# Patient Record
Sex: Male | Born: 1950
Health system: Southern US, Community
[De-identification: ages and names within clinical notes are randomized; demographics above are authoritative.]

## PROBLEM LIST (undated history)

## (undated) DIAGNOSIS — T7840XA Allergy, unspecified, initial encounter: Secondary | ICD-10-CM

## (undated) DIAGNOSIS — I10 Essential (primary) hypertension: Secondary | ICD-10-CM

## (undated) DIAGNOSIS — I2510 Atherosclerotic heart disease of native coronary artery without angina pectoris: Secondary | ICD-10-CM

## (undated) DIAGNOSIS — E785 Hyperlipidemia, unspecified: Secondary | ICD-10-CM

## (undated) DIAGNOSIS — N529 Male erectile dysfunction, unspecified: Secondary | ICD-10-CM

## (undated) DIAGNOSIS — H269 Unspecified cataract: Secondary | ICD-10-CM

## (undated) HISTORY — DX: Male erectile dysfunction, unspecified: N52.9

## (undated) HISTORY — DX: Hyperlipidemia, unspecified: E78.5

## (undated) HISTORY — DX: Unspecified cataract: H26.9

## (undated) HISTORY — PX: CORONARY STENT PLACEMENT: SHX1402

## (undated) HISTORY — PX: CATARACT EXTRACTION: SUR2

## (undated) HISTORY — PX: EYE SURGERY: SHX253

## (undated) HISTORY — DX: Atherosclerotic heart disease of native coronary artery without angina pectoris: I25.10

## (undated) HISTORY — DX: Allergy, unspecified, initial encounter: T78.40XA

## (undated) HISTORY — PX: VASECTOMY: SHX75

## (undated) HISTORY — PX: POLYPECTOMY: SHX149

## (undated) HISTORY — PX: COLONOSCOPY: SHX174

---

## 1999-06-21 ENCOUNTER — Inpatient Hospital Stay (HOSPITAL_COMMUNITY): Admission: EM | Admit: 1999-06-21 | Discharge: 1999-06-22 | Payer: Self-pay | Admitting: Emergency Medicine

## 2011-11-04 ENCOUNTER — Other Ambulatory Visit (HOSPITAL_COMMUNITY): Payer: Self-pay | Admitting: Oral Surgery

## 2011-11-23 ENCOUNTER — Encounter (HOSPITAL_COMMUNITY): Payer: Self-pay | Admitting: Pharmacy Technician

## 2011-11-29 ENCOUNTER — Encounter (HOSPITAL_COMMUNITY): Payer: Self-pay | Admitting: *Deleted

## 2011-11-29 NOTE — Pre-Procedure Instructions (Signed)
20 DEONTAY LADNIER  11/29/2011   Your procedure is scheduled on:  Monday, June 24th  Report to [facility] at  AM.  Call this number if you have problems the morning of surgery: [phone]   Remember:  Do not dink any liquid or eat nay food after midnight .   Take these medicines the morning of surgery with A SIP OF WATER: None   Do not wear jewelry, make-up or nail polish.  Do not wear lotions, powders, or perfumes. You may wear deodorant.  Do not shave 48 hours prior to surgery. Men may shave face and neck.  Do not bring valuables to the hospital.  Contacts, dentures or bridgework may not be worn into surgery.  Leave suitcase in the car. After surgery it may be brought to your room.  For patients admitted to the hospital, checkout time is 11:00 AM the day of discharge.   Patients discharged the day of surgery will not be allowed to drive home.  Name and phone number of your driver: _______________  Special Instructions: CHG Shower Use Special Wash: 1/2 bottle night before surgery and 1/2 bottle morning of surgery.   Please read over the following fact sheets that you were given: Pain Booklet, Coughing and Deep Breathing, MRSA Information and Surgical Site Infection Prevention

## 2011-12-01 ENCOUNTER — Encounter (HOSPITAL_COMMUNITY)
Admission: RE | Admit: 2011-12-01 | Discharge: 2011-12-01 | Disposition: A | Discharge status: Home / Self Care | Payer: 59 | Source: Ambulatory Visit | Attending: Anesthesiology | Admitting: Anesthesiology

## 2011-12-01 ENCOUNTER — Encounter (HOSPITAL_COMMUNITY)
Admission: RE | Admit: 2011-12-01 | Discharge: 2011-12-01 | Disposition: A | Discharge status: Home / Self Care | Payer: 59 | Source: Ambulatory Visit | Attending: Oral Surgery | Admitting: Oral Surgery

## 2011-12-01 LAB — CBC
HCT: 40.1 % (ref 39.0–52.0)
Hemoglobin: 13.7 g/dL (ref 13.0–17.0)
MCH: 31.6 pg (ref 26.0–34.0)
MCHC: 34.2 g/dL (ref 30.0–36.0)
RBC: 4.34 MIL/uL (ref 4.22–5.81)

## 2011-12-01 LAB — BASIC METABOLIC PANEL
BUN: 16 mg/dL (ref 6–23)
CO2: 28 mEq/L (ref 19–32)
Chloride: 102 mEq/L (ref 96–112)
Glucose, Bld: 104 mg/dL — ABNORMAL HIGH (ref 70–99)
Potassium: 3.7 mEq/L (ref 3.5–5.1)
Sodium: 138 mEq/L (ref 135–145)

## 2011-12-01 NOTE — Pre-Procedure Instructions (Signed)
20 Johnny Wilcox  12/01/2011   Your procedure is scheduled on:  Monday, June 24th  Report to [facility] at  8:45 AM.  Call this number if you have problems the morning of surgery: [phone]   Remember:  Do not dink any liquid or eat nay food after midnight .   Take these medicines the morning of surgery with A SIP OF WATER: None   Do not wear jewelry, make-up or nail polish.  Do not wear lotions, powders, or perfumes. You may wear deodorant.  Do not shave 48 hours prior to surgery. Men may shave face and neck.  Do not bring valuables to the hospital.  Contacts, dentures or bridgework may not be worn into surgery.  Leave suitcase in the car. After surgery it may be brought to your room.  For patients admitted to the hospital, checkout time is 11:00 AM the day of discharge.   Patients discharged the day of surgery will not be allowed to drive home.  Name and phone number of your driver: Chyrl Civatte [phone]   Special Instructions: CHG Shower Use Special Wash: 1/2 bottle night before surgery and 1/2 bottle morning of surgery.   Please read over the following fact sheets that you were given: Pain Booklet, Coughing and Deep Breathing, MRSA Information and Surgical Site Infection Prevention

## 2011-12-02 NOTE — H&P (Signed)
HISTORY AND PHYSICAL  Johnny Wilcox is a 61 y.o. male patient with CC: Painful teeth  No diagnosis found.  Past Medical History  Diagnosis Date  . Hypertension     No current facility-administered medications for this encounter.   Current Outpatient Prescriptions  Medication Sig Dispense Refill  . lisinopril (PRINIVIL,ZESTRIL) 20 MG tablet Take 20 mg by mouth daily.       No Known Allergies Active Problems:  * No active hospital problems. *   Vitals: There were no vitals taken for this visit. Lab results:No results found for this or any previous visit (from the past 24 hour(s)). Radiology Results: Dg Chest 2 View  12/01/2011  *RADIOLOGY REPORT*  Clinical Data: Preop for teeth extraction, hypertension  CHEST - 2 VIEW  Comparison: None.  Findings: No active infiltrate or effusion is seen.  Mediastinal contours appear normal.  Minimal peribronchial thickening is noted. The heart is within normal limits in size.  No bony abnormality is seen.  IMPRESSION: No active lung disease.  Original Report Authenticated By: Juline Patch, M.D.   General appearance: alert, cooperative and mildly obese Head: Normocephalic, without obvious abnormality, atraumatic Eyes: negative Nose: Nares normal. Septum midline. Mucosa normal. No drainage or sinus tenderness. Throat: severe dental caries teeth #'s 2, 3, 4, 5, 7, 10, 11, 12, 13, 14, 15, 20, 21, 22, 23, 26, 27, 28, 29,  Neck: no adenopathy and supple, symmetrical, trachea midline Resp: clear to auscultation bilaterally Cardio: regular rate and rhythm, S1, S2 normal, no murmur, click, rub or gallop GI: soft, non-tender; bowel sounds normal; no masses,  no organomegaly  Assessment:[name] HTN with teeth #'s 2, 3, 4, 5, 7, 10, 11, 12, 13, 14, 15, 20, 21, 22, 23, 26, 27, 28, 29,  Plan:Extract teeth #'s 2, 3, 4, 5, 7, 10, 11, 12, 13, 14, 15, 20, 21, 22, 23, 26, 27, 28, 29, alveoloplasty, insert immediate dentures. General anesthesia. Day  surgery   Johnny Wilcox M 12/02/2011

## 2011-12-06 ENCOUNTER — Encounter (HOSPITAL_COMMUNITY): Payer: Self-pay | Admitting: Surgery

## 2011-12-06 ENCOUNTER — Encounter (HOSPITAL_COMMUNITY)
Admission: RE | Disposition: A | Discharge status: Home / Self Care | Payer: Self-pay | Source: Ambulatory Visit | Attending: Oral Surgery

## 2011-12-06 ENCOUNTER — Encounter (HOSPITAL_COMMUNITY): Payer: Self-pay | Admitting: Certified Registered"

## 2011-12-06 ENCOUNTER — Ambulatory Visit (HOSPITAL_COMMUNITY): Payer: 59 | Admitting: Certified Registered"

## 2011-12-06 ENCOUNTER — Ambulatory Visit (HOSPITAL_COMMUNITY)
Admission: RE | Admit: 2011-12-06 | Discharge: 2011-12-06 | Disposition: A | Discharge status: Home / Self Care | Payer: 59 | Source: Ambulatory Visit | Attending: Oral Surgery | Admitting: Oral Surgery

## 2011-12-06 ENCOUNTER — Encounter (HOSPITAL_COMMUNITY): Payer: Self-pay | Admitting: Certified Registered Nurse Anesthetist

## 2011-12-06 DIAGNOSIS — Z01812 Encounter for preprocedural laboratory examination: Secondary | ICD-10-CM | POA: Insufficient documentation

## 2011-12-06 DIAGNOSIS — I10 Essential (primary) hypertension: Secondary | ICD-10-CM | POA: Insufficient documentation

## 2011-12-06 DIAGNOSIS — Z01818 Encounter for other preprocedural examination: Secondary | ICD-10-CM | POA: Insufficient documentation

## 2011-12-06 DIAGNOSIS — Z01810 Encounter for preprocedural cardiovascular examination: Secondary | ICD-10-CM | POA: Insufficient documentation

## 2011-12-06 DIAGNOSIS — K029 Dental caries, unspecified: Secondary | ICD-10-CM

## 2011-12-06 DIAGNOSIS — F17200 Nicotine dependence, unspecified, uncomplicated: Secondary | ICD-10-CM | POA: Insufficient documentation

## 2011-12-06 HISTORY — DX: Essential (primary) hypertension: I10

## 2011-12-06 HISTORY — PX: MULTIPLE EXTRACTIONS WITH ALVEOLOPLASTY: SHX5342

## 2011-12-06 SURGERY — MULTIPLE EXTRACTION WITH ALVEOLOPLASTY
Anesthesia: General | Site: Mouth | Laterality: Bilateral | Wound class: Clean Contaminated

## 2011-12-06 MED ORDER — SUCCINYLCHOLINE CHLORIDE 20 MG/ML IJ SOLN
INTRAMUSCULAR | Status: DC | PRN
  Administered 2011-12-06: 120 mg via INTRAVENOUS

## 2011-12-06 MED ORDER — GLYCOPYRROLATE 0.2 MG/ML IJ SOLN
INTRAMUSCULAR | Status: DC | PRN
  Administered 2011-12-06: 0.2 mg via INTRAVENOUS

## 2011-12-06 MED ORDER — CEFAZOLIN SODIUM 1-5 GM-% IV SOLN
INTRAVENOUS | Status: AC
  Filled 2011-12-06: qty 100

## 2011-12-06 MED ORDER — LACTATED RINGERS IV SOLN
INTRAVENOUS | Status: DC | PRN
  Administered 2011-12-06 (×2): via INTRAVENOUS

## 2011-12-06 MED ORDER — LACTATED RINGERS IV SOLN
INTRAVENOUS | Status: DC
  Administered 2011-12-06: 11:00:00 via INTRAVENOUS

## 2011-12-06 MED ORDER — LIDOCAINE HCL (CARDIAC) 20 MG/ML IV SOLN
INTRAVENOUS | Status: DC | PRN
  Administered 2011-12-06: 50 mg via INTRAVENOUS

## 2011-12-06 MED ORDER — MIDAZOLAM HCL 5 MG/5ML IJ SOLN
INTRAMUSCULAR | Status: DC | PRN
  Administered 2011-12-06: 1 mg via INTRAVENOUS

## 2011-12-06 MED ORDER — FENTANYL CITRATE 0.05 MG/ML IJ SOLN
INTRAMUSCULAR | Status: DC | PRN
  Administered 2011-12-06 (×2): 100 ug via INTRAVENOUS

## 2011-12-06 MED ORDER — SODIUM CHLORIDE 0.9 % IR SOLN
Status: DC | PRN
  Administered 2011-12-06: 1000 mL

## 2011-12-06 MED ORDER — LIDOCAINE-EPINEPHRINE 2 %-1:100000 IJ SOLN
INTRAMUSCULAR | Status: DC | PRN
  Administered 2011-12-06: 20 mL

## 2011-12-06 MED ORDER — CEFAZOLIN SODIUM 1-5 GM-% IV SOLN
INTRAVENOUS | Status: DC | PRN
  Administered 2011-12-06: 2 g via INTRAVENOUS

## 2011-12-06 MED ORDER — ONDANSETRON HCL 4 MG/2ML IJ SOLN
INTRAMUSCULAR | Status: DC | PRN
  Administered 2011-12-06: 4 mg via INTRAVENOUS

## 2011-12-06 MED ORDER — HYDROMORPHONE HCL PF 1 MG/ML IJ SOLN
0.2500 mg | INTRAMUSCULAR | Status: DC | PRN
  Administered 2011-12-06: 0.5 mg via INTRAVENOUS

## 2011-12-06 MED ORDER — PROPOFOL 10 MG/ML IV EMUL
INTRAVENOUS | Status: DC | PRN
  Administered 2011-12-06: 50 mg via INTRAVENOUS
  Administered 2011-12-06: 150 mg via INTRAVENOUS

## 2011-12-06 MED ORDER — LIDOCAINE-EPINEPHRINE 2 %-1:100000 IJ SOLN
INTRAMUSCULAR | Status: AC
  Filled 2011-12-06: qty 1

## 2011-12-06 MED ORDER — EPHEDRINE SULFATE 50 MG/ML IJ SOLN
INTRAMUSCULAR | Status: DC | PRN
  Administered 2011-12-06: 10 mg via INTRAVENOUS
  Administered 2011-12-06: 5 mg via INTRAVENOUS

## 2011-12-06 MED ORDER — OXYMETAZOLINE HCL 0.05 % NA SOLN
NASAL | Status: DC | PRN
  Administered 2011-12-06: 1 via NASAL

## 2011-12-06 MED ORDER — ONDANSETRON HCL 4 MG/2ML IJ SOLN: 4.0000 mg | Freq: Once | INTRAMUSCULAR | Status: DC | PRN

## 2011-12-06 MED ORDER — HYDROMORPHONE HCL PF 1 MG/ML IJ SOLN
INTRAMUSCULAR | Status: AC
  Filled 2011-12-06: qty 1

## 2011-12-06 MED ORDER — OXYCODONE-ACETAMINOPHEN 5-325 MG PO TABS: 1.0000 | ORAL_TABLET | ORAL | Status: AC | PRN

## 2011-12-06 SURGICAL SUPPLY — 28 items
BUR CROSS CUT FISSURE 1.6 (BURR) ×2 IMPLANT
BUR EGG ELITE 4.0 (BURR) ×2 IMPLANT
CANISTER SUCTION 2500CC (MISCELLANEOUS) ×2 IMPLANT
CLOTH BEACON ORANGE TIMEOUT ST (SAFETY) ×2 IMPLANT
COVER SURGICAL LIGHT HANDLE (MISCELLANEOUS) ×2 IMPLANT
CRADLE DONUT ADULT HEAD (MISCELLANEOUS) ×2 IMPLANT
DECANTER SPIKE VIAL GLASS SM (MISCELLANEOUS) ×2 IMPLANT
GAUZE PACKING FOLDED 2  STR (GAUZE/BANDAGES/DRESSINGS) ×1
GAUZE PACKING FOLDED 2 STR (GAUZE/BANDAGES/DRESSINGS) ×1 IMPLANT
GLOVE BIO SURGEON STRL SZ 6.5 (GLOVE) ×2 IMPLANT
GLOVE BIO SURGEON STRL SZ7 (GLOVE) ×1 IMPLANT
GLOVE BIO SURGEON STRL SZ7.5 (GLOVE) ×2 IMPLANT
GLOVE BIOGEL PI IND STRL 7.0 (GLOVE) ×1 IMPLANT
GLOVE BIOGEL PI INDICATOR 7.0 (GLOVE)
GOWN STRL NON-REIN LRG LVL3 (GOWN DISPOSABLE) ×3 IMPLANT
GOWN STRL REIN XL XLG (GOWN DISPOSABLE) ×2 IMPLANT
KIT BASIN OR (CUSTOM PROCEDURE TRAY) ×2 IMPLANT
KIT ROOM TURNOVER OR (KITS) ×2 IMPLANT
NEEDLE 22X1 1/2 (OR ONLY) (NEEDLE) ×2 IMPLANT
NS IRRIG 1000ML POUR BTL (IV SOLUTION) ×2 IMPLANT
PAD ARMBOARD 7.5X6 YLW CONV (MISCELLANEOUS) ×4 IMPLANT
SUT CHROMIC 3 0 PS 2 (SUTURE) ×3 IMPLANT
SYR 50ML SLIP (SYRINGE) ×1 IMPLANT
TOWEL OR 17X26 10 PK STRL BLUE (TOWEL DISPOSABLE) ×2 IMPLANT
TRAY ENT MC OR (CUSTOM PROCEDURE TRAY) ×2 IMPLANT
TUBING IRRIGATION (MISCELLANEOUS) ×1 IMPLANT
WATER STERILE IRR 1000ML POUR (IV SOLUTION) IMPLANT
YANKAUER SUCT BULB TIP NO VENT (SUCTIONS) ×2 IMPLANT

## 2011-12-06 NOTE — H&P (Signed)
H&P documentation  -History and Physical Reviewed  -Patient has been re-examined  -No change in the plan of care  Johnny Wilcox  

## 2011-12-06 NOTE — Anesthesia Postprocedure Evaluation (Signed)
  Anesthesia Post-op Note  Patient: Johnny Wilcox  Procedure(s) Performed: Procedure(s) (LRB): MULTIPLE EXTRACION WITH ALVEOLOPLASTY (Bilateral)  Patient Location: PACU  Anesthesia Type: General  Level of Consciousness: awake, alert  and oriented  Airway and Oxygen Therapy: Patient Spontanous Breathing  Post-op Pain: none  Post-op Assessment: Post-op Vital signs reviewed and Patient's Cardiovascular Status Stable  Post-op Vital Signs: stable  Complications: No apparent anesthesia complications

## 2011-12-06 NOTE — Transfer of Care (Signed)
Immediate Anesthesia Transfer of Care Note  Patient: Johnny Wilcox  Procedure(s) Performed: Procedure(s) (LRB): MULTIPLE EXTRACION WITH ALVEOLOPLASTY (Bilateral)  Patient Location: PACU  Anesthesia Type: General  Level of Consciousness: awake, alert , oriented and patient cooperative  Airway & Oxygen Therapy: Patient Spontanous Breathing and Patient connected to face mask oxygen  Post-op Assessment: Report given to PACU RN, Post -op Vital signs reviewed and stable and Patient moving all extremities X 4  Post vital signs: Reviewed and stable  Complications: No apparent anesthesia complications

## 2011-12-06 NOTE — Anesthesia Preprocedure Evaluation (Signed)
Anesthesia Evaluation  Patient identified by MRN, date of birth, ID band  Reviewed: Allergy & Precautions, H&P , NPO status , Patient's Chart, lab work & pertinent test results  Airway Mallampati: II      Dental  (+) Poor Dentition   Pulmonary  breath sounds clear to auscultation        Cardiovascular Rhythm:Regular Rate:Normal     Neuro/Psych    GI/Hepatic   Endo/Other    Renal/GU      Musculoskeletal   Abdominal   Peds  Hematology   Anesthesia Other Findings   Reproductive/Obstetrics                           Anesthesia Physical Anesthesia Plan  ASA: II  Anesthesia Plan: General   Post-op Pain Management:    Induction: Intravenous  Airway Management Planned: Nasal ETT  Additional Equipment:   Intra-op Plan:   Post-operative Plan: Extubation in OR  Informed Consent: I have reviewed the patients History and Physical, chart, labs and discussed the procedure including the risks, benefits and alternatives for the proposed anesthesia with the patient or authorized representative who has indicated his/her understanding and acceptance.   Dental advisory given  Plan Discussed with:   Anesthesia Plan Comments: (Htn  Poor Dentition   Plan GA with nasal ETT  Kipp Brood, MD)        Anesthesia Quick Evaluation

## 2011-12-06 NOTE — Op Note (Signed)
Johnny Wilcox, Johnny Wilcox NO.:  [id number]  MEDICAL RECORD NO.:  [id number]  LOCATION:  [facility]                         FACILITY:  [facility]  PHYSICIAN:  Georgia Lopes, M.D.  DATE OF BIRTH:  02/23/1951  DATE OF PROCEDURE:  12/06/2011 DATE OF DISCHARGE:  12/06/2011                              OPERATIVE REPORT   PREOPERATIVE DIAGNOSIS:  Nonrestorable teeth numbers 2, 3, 4, 5, 7, 10, 11, 12, 13, 14, 15, 16, 19, 20, 21, 22, 23, 26, 27, 28, 29.  POSTOPERATIVE DIAGNOSIS:  Nonrestorable teeth numbers 2, 3, 4, 5, 7, 10, 11, 12, 13, 14, 15, 16, 19, 20, 21, 22, 23, 26, 27, 28, 29.  PROCEDURE:  Extraction of teeth numbers 2, 3, 4, 5, 7, 10, 11, 12, 13, 14, 15, 16, 19, 20, 21, 22, 23, 26, 27, 28, 29.  Alveoplasty of right and left maxilla and mandible.  Insertion of immediate upper dentures.  SURGEON:  Georgia Lopes, M.D.  ANESTHESIA:  General, Dr. Noreene Larsson attending, nasal intubation.  INDICATIONS FOR PROCEDURE:  Johnny Wilcox is a 61 year old white male with severe dental caries on his remaining teeth.  He has a history of hypertension, oral tobacco use.  At his initial consultation and when the Surgery was brought to the OR, he did have a lesion in his right buccal vestibule, but since then he had stopped using oral tobacco and the lesion had resolved upon re-exam shortly prior to surgery.  For this reason, at the time of surgery, it was understood with the patient and myself that he would not need a biopsy in the right buccal vestibule of the mandible.  Because of the extensiveness of the surgery and the number of teeth to be removed, it was recommended that the patient undergo the procedure with general anesthesia and intubation for airway protection.  PROCEDURE:  The patient was taken to the operating room, placed on the table in supine position.  General anesthesia was administered intravenously and a nasal endotracheal tube was placed and secured.  The eyes were protected.   The patient was draped for the procedure.  A time- out was performed.  The posterior pharynx was suctioned and a throat pack was placed.  A 2% lidocaine with 1:100,000 epinephrine was infiltrated in the inferior-alveolar block on the right and left sides, and a buccal and palatal infiltration in the maxilla around the teeth to be removed.  A total of 18 mL was utilized.  Then, a 15 blade was used to make a full-thickness incision in the right mandible beginning at tooth number 19 carried anteriorly to tooth number 23 on both the buccal and lingual surfaces.  In the maxilla, the 15 blade was used to make a full-thickness incision around teeth number 10, 11, 12, 13, 14, 15 on the buccal and palatal aspects.  Then the periosteal elevator was used to reflect the periosteum from around these teeth.  Attempt was made to elevate the teeth with a 301 elevator and attempts with the universal forceps was made to extract the teeth; however, the teeth proved to be decayed to the point that they have fractured off and were not removed from the  mouth.  Then, the Stryker handpiece with a fissure bur under irrigation was used to remove circumferential bone from around these teeth and dissection of the posterior teeth were applicable.  The teeth were then removed using the 301 elevator and a rongeur.  Sockets were curetted, but not sutured at this point, and then the bite block was repositioned to the other side of the mouth and a similar procedure was performed in the right maxilla and mandible.  Using a 15 blade, a full- thickness incision was created around teeth numbers 2, 3, 4, 5, and 7 in the maxilla and teeth numbers 26, 27, 28, 29 in the mandible.  The periosteum was reflected and the interproximal bone was removed with the Stryker handpiece using irrigation.  The teeth were then elevated and removed with a 301 elevator and rongeur, and sockets were curetted and then the periosteum was further  reflected to allow for alveoplasty in the maxilla and mandible.  The egg-shaped bur and the bone file were then used to perform the alveoplasty.  The dentures were tried and found to fit adequately.  Then, the areas were irrigated and sutures were placed using 3-0 chromic.  The oral cavity was then irrigated and suctioned.  The throat pack was removed.  Dentures were then placed in the mouth, and the patient was awakened, extubated and taken to the recovery room breathing spontaneously in good condition.  ESTIMATED BLOOD LOSS:  Minimum.  COMPLICATIONS:  None.  SPECIMENS:  None.     Georgia Lopes, M.D.     SMJ/MEDQ  D:  12/06/2011  T:  12/06/2011  Job:  [id number]

## 2011-12-06 NOTE — Op Note (Signed)
12/06/2011  1:59 PM  PATIENT:  Johnny Wilcox  61 y.o. male  PRE-OPERATIVE DIAGNOSIS:  Non restorable teeth #'s 2, 3, 4, 5, 7, 10, 11, 12, 13, 14, 15, 16, 19, 20, 21, 22, 23, 26, 27, 28, 29  POST-OPERATIVE DIAGNOSIS:  SAME  PROCEDURE:  Procedure(s): MULTIPLE EXTRACTION Teeth #'s 2, 3, 4, 5, 7, 10, 11, 12, 13, 14, 15, 16, 19, 20, 21, 22, 23, 26, 27, 28, 29  WITH ALVEOLOPLASTY, Insertion immediate upper and lower dentures.  SURGEON:  Surgeon(s): Georgia Lopes, DDS  ANESTHESIA:   local and general  EBL:  minimal  DRAINS: none   SPECIMEN:  No Specimen  COUNTS:  YES  PLAN OF CARE: Discharge to home after PACU  PATIENT DISPOSITION:  PACU - hemodynamically stable.   PROCEDURE DETAILS: Dictation # [id number]  Georgia Lopes, DMD 12/06/2011 1:59 PM

## 2011-12-09 ENCOUNTER — Encounter (HOSPITAL_COMMUNITY): Payer: Self-pay | Admitting: Oral Surgery

## 2012-02-04 ENCOUNTER — Encounter: Payer: Self-pay | Admitting: Internal Medicine

## 2012-03-22 ENCOUNTER — Ambulatory Visit (INDEPENDENT_AMBULATORY_CARE_PROVIDER_SITE_OTHER): Payer: 59 | Admitting: Internal Medicine

## 2012-03-22 ENCOUNTER — Encounter: Payer: Self-pay | Admitting: Internal Medicine

## 2012-03-22 VITALS — BP 115/73 | HR 44 | Temp 98.0°F | Resp 16 | Ht 69.0 in | Wt 234.0 lb

## 2012-03-22 DIAGNOSIS — I10 Essential (primary) hypertension: Secondary | ICD-10-CM

## 2012-03-22 DIAGNOSIS — Z6834 Body mass index (BMI) 34.0-34.9, adult: Secondary | ICD-10-CM | POA: Insufficient documentation

## 2012-03-22 DIAGNOSIS — E785 Hyperlipidemia, unspecified: Secondary | ICD-10-CM

## 2012-03-22 DIAGNOSIS — Z23 Encounter for immunization: Secondary | ICD-10-CM

## 2012-03-22 DIAGNOSIS — Z0000 Encounter for general adult medical examination without abnormal findings: Secondary | ICD-10-CM

## 2012-03-22 DIAGNOSIS — N400 Enlarged prostate without lower urinary tract symptoms: Secondary | ICD-10-CM | POA: Insufficient documentation

## 2012-03-22 DIAGNOSIS — K5790 Diverticulosis of intestine, part unspecified, without perforation or abscess without bleeding: Secondary | ICD-10-CM | POA: Insufficient documentation

## 2012-03-22 LAB — IFOBT (OCCULT BLOOD): IFOBT: NEGATIVE

## 2012-03-22 MED ORDER — LISINOPRIL-HYDROCHLOROTHIAZIDE 10-12.5 MG PO TABS: 1.0000 | ORAL_TABLET | Freq: Every day | ORAL | Status: DC

## 2012-03-22 NOTE — Progress Notes (Signed)
Subjective:    Patient ID: Johnny Wilcox, male    DOB: [birth year], 61 y.o.   MRN: [id number]  HPIHere for annual physical examination and followup of hypertension Continues to work 12-15 hours a day at [employer] Denies any new health issues Continues on lisinopril hydrochlorothiazide 10/12.5 for hypertension but misses 2-3 doses a week. Outside blood pressures remained normal  Past history includes cataracts/now has glasses  Family history has no specific medical problems other than hypertension  Social history married long-term/one daughter and one son/one granddaughter who is a couple of his High school education Nonsmoker/no alcohol/never exercises because he works so hard   Review of Systems 13 point review of systems is negative including no chest pain palpitations shortness of breath easy fatigability,Edema, urinary complaints or change in bowel movements. He has used cialis as call in w/ good results    Objective:   Physical Exam Vital signs blood pressure 115/73 weight 234 pounds pulse 44 Skin is clear HEENT reveals pupils equal round reactive to light and accommodation with intact extraocular movements/Conjunctiva clear//Wearing glasses TMs clear/nares clear/oral pharynx clear No thyromegaly or lymphadenopathy Heart regular without murmur No carotid bruits Lungs clear to auscultation Abdomen soft nontender nondistended without organomegaly or masses/no abdominal bruit Rectal exam without masses/prostate soft symmetrical with no nodules Brown stool for Hemoccult Extremities= full range of motion about large joints No peripheral edema/full peripheral pulses Neurological grossly intact including hearing        Assessment & Plan:  Annual exam suggests that he is in very good condition despite hypertension and a BMI of 34 Problem #1 Hypertension Problem #2 BMI 34 Problem #3 ED  Routine labs sent Meds ordered this encounter  Medications  .  lisinopril-hydrochlorothiazide (PRINZIDE,ZESTORETIC) 10-12.5 MG per tablet    Sig: Take 1 tablet by mouth daily.    Dispense:  90 tablet    Refill:  3  Pneumovax Flu vaccine Check to see if colonoscopy due OK to continue cialis 20 and call for refills   Adden: labs Results for orders placed in visit on 03/22/12  CBC WITH DIFFERENTIAL      Component Value Range   WBC 6.9  4.0 - 10.5 K/uL   RBC 4.59  4.22 - 5.81 MIL/uL   Hemoglobin 14.3  13.0 - 17.0 g/dL   HCT 44.0  10.2 - 72.5 %   MCV 91.5  78.0 - 100.0 fL   MCH 31.2  26.0 - 34.0 pg   MCHC 34.0  30.0 - 36.0 g/dL   RDW 36.6  44.0 - 34.7 %   Platelets 262  150 - 400 K/uL   Neutrophils Relative 63  43 - 77 %   Neutro Abs 4.4  1.7 - 7.7 K/uL   Lymphocytes Relative 26  12 - 46 %   Lymphs Abs 1.8  0.7 - 4.0 K/uL   Monocytes Relative 7  3 - 12 %   Monocytes Absolute 0.5  0.1 - 1.0 K/uL   Eosinophils Relative 3  0 - 5 %   Eosinophils Absolute 0.2  0.0 - 0.7 K/uL   Basophils Relative 0  0 - 1 %   Basophils Absolute 0.0  0.0 - 0.1 K/uL   Smear Review Criteria for review not met    COMPREHENSIVE METABOLIC PANEL      Component Value Range   Sodium 139  135 - 145 mEq/L   Potassium 3.8  3.5 - 5.3 mEq/L   Chloride 102  96 - 112 mEq/L  CO2 30  19 - 32 mEq/L   Glucose, Bld 74  70 - 99 mg/dL   BUN 15  6 - 23 mg/dL   Creat 0.98  1.19 - 1.47 mg/dL   Total Bilirubin 0.9  0.3 - 1.2 mg/dL   Alkaline Phosphatase 65  39 - 117 U/L   AST 17  0 - 37 U/L   ALT 15  0 - 53 U/L   Total Protein 7.7  6.0 - 8.3 g/dL   Albumin 4.8  3.5 - 5.2 g/dL   Calcium 9.8  8.4 - 82.9 mg/dL  LIPID PANEL      Component Value Range   Cholesterol 203 (*) 0 - 200 mg/dL   Triglycerides 562 (*) <150 mg/dL   HDL 34 (*) >13 mg/dL   Total CHOL/HDL Ratio 6.0     VLDL 33  0 - 40 mg/dL   LDL Cholesterol 086 (*) 0 - 99 mg/dL  PSA      Component Value Range   PSA 0.48  <=4.00 ng/mL  IFOBT (OCCULT BLOOD)      Component Value Range   IFOBT Negative     Will need to  add lipitor 10mg  with LDL goal <100

## 2012-03-23 LAB — LIPID PANEL
LDL Cholesterol: 136 mg/dL — ABNORMAL HIGH (ref 0–99)
Total CHOL/HDL Ratio: 6 Ratio
VLDL: 33 mg/dL (ref 0–40)

## 2012-03-23 LAB — CBC WITH DIFFERENTIAL/PLATELET
Basophils Absolute: 0 10*3/uL (ref 0.0–0.1)
Eosinophils Absolute: 0.2 10*3/uL (ref 0.0–0.7)
Eosinophils Relative: 3 % (ref 0–5)
Lymphocytes Relative: 26 % (ref 12–46)
MCV: 91.5 fL (ref 78.0–100.0)
Platelets: 262 10*3/uL (ref 150–400)
RDW: 12.9 % (ref 11.5–15.5)
WBC: 6.9 10*3/uL (ref 4.0–10.5)

## 2012-03-23 LAB — PSA: PSA: 0.48 ng/mL (ref <=4.00)

## 2012-03-23 LAB — COMPREHENSIVE METABOLIC PANEL
ALT: 15 U/L (ref 0–53)
AST: 17 U/L (ref 0–37)
Chloride: 102 mEq/L (ref 96–112)
Creat: 1.05 mg/dL (ref 0.50–1.35)
Total Bilirubin: 0.9 mg/dL (ref 0.3–1.2)

## 2012-03-24 ENCOUNTER — Encounter: Payer: Self-pay | Admitting: Internal Medicine

## 2012-03-24 MED ORDER — ATORVASTATIN CALCIUM 10 MG PO TABS: 10.0000 mg | ORAL_TABLET | Freq: Every day | ORAL | Status: DC

## 2012-03-24 NOTE — Addendum Note (Signed)
Addended by: Tonye Pearson on: 03/24/2012 10:54 AM   Modules accepted: Orders

## 2012-03-28 ENCOUNTER — Encounter: Payer: Self-pay | Admitting: Gastroenterology

## 2012-04-04 ENCOUNTER — Other Ambulatory Visit: Payer: Self-pay | Admitting: Internal Medicine

## 2012-04-04 DIAGNOSIS — I10 Essential (primary) hypertension: Secondary | ICD-10-CM

## 2012-04-04 DIAGNOSIS — N529 Male erectile dysfunction, unspecified: Secondary | ICD-10-CM | POA: Insufficient documentation

## 2012-04-04 DIAGNOSIS — E785 Hyperlipidemia, unspecified: Secondary | ICD-10-CM

## 2012-04-04 MED ORDER — TADALAFIL 20 MG PO TABS: 20.0000 mg | ORAL_TABLET | Freq: Every day | ORAL | Status: DC | PRN

## 2012-04-04 MED ORDER — PRAVASTATIN SODIUM 20 MG PO TABS: 20.0000 mg | ORAL_TABLET | Freq: Every day | ORAL | Status: DC

## 2012-04-05 ENCOUNTER — Encounter: Payer: Self-pay | Admitting: Internal Medicine

## 2012-11-09 ENCOUNTER — Encounter: Payer: Self-pay | Admitting: Gastroenterology

## 2012-12-12 ENCOUNTER — Encounter: Payer: Self-pay | Admitting: Internal Medicine

## 2013-01-03 ENCOUNTER — Ambulatory Visit (INDEPENDENT_AMBULATORY_CARE_PROVIDER_SITE_OTHER): Payer: 59 | Admitting: Internal Medicine

## 2013-01-03 ENCOUNTER — Encounter: Payer: Self-pay | Admitting: Internal Medicine

## 2013-01-03 VITALS — BP 150/92 | HR 55 | Temp 97.7°F | Resp 16 | Ht 69.0 in | Wt 220.0 lb

## 2013-01-03 DIAGNOSIS — Z0000 Encounter for general adult medical examination without abnormal findings: Secondary | ICD-10-CM

## 2013-01-03 DIAGNOSIS — N529 Male erectile dysfunction, unspecified: Secondary | ICD-10-CM

## 2013-01-03 DIAGNOSIS — E785 Hyperlipidemia, unspecified: Secondary | ICD-10-CM

## 2013-01-03 DIAGNOSIS — Z1211 Encounter for screening for malignant neoplasm of colon: Secondary | ICD-10-CM

## 2013-01-03 DIAGNOSIS — I10 Essential (primary) hypertension: Secondary | ICD-10-CM

## 2013-01-03 LAB — COMPREHENSIVE METABOLIC PANEL
AST: 13 U/L (ref 0–37)
Albumin: 4.3 g/dL (ref 3.5–5.2)
Alkaline Phosphatase: 64 U/L (ref 39–117)
Chloride: 104 mEq/L (ref 96–112)
Glucose, Bld: 98 mg/dL (ref 70–99)
Potassium: 4.5 mEq/L (ref 3.5–5.3)
Sodium: 142 mEq/L (ref 135–145)
Total Protein: 6.7 g/dL (ref 6.0–8.3)

## 2013-01-03 LAB — CBC WITH DIFFERENTIAL/PLATELET
Basophils Absolute: 0 10*3/uL (ref 0.0–0.1)
Basophils Relative: 1 % (ref 0–1)
Hemoglobin: 13.4 g/dL (ref 13.0–17.0)
Lymphocytes Relative: 28 % (ref 12–46)
MCHC: 34.2 g/dL (ref 30.0–36.0)
Monocytes Relative: 9 % (ref 3–12)
Neutro Abs: 3.1 10*3/uL (ref 1.7–7.7)
Neutrophils Relative %: 58 % (ref 43–77)
WBC: 5.3 10*3/uL (ref 4.0–10.5)

## 2013-01-03 LAB — POCT URINALYSIS DIPSTICK
Leukocytes, UA: NEGATIVE
Nitrite, UA: NEGATIVE
Protein, UA: 30
Urobilinogen, UA: 0.2

## 2013-01-03 LAB — TSH: TSH: 1.863 u[IU]/mL (ref 0.350–4.500)

## 2013-01-03 LAB — LIPID PANEL
LDL Cholesterol: 73 mg/dL (ref 0–99)
VLDL: 50 mg/dL — ABNORMAL HIGH (ref 0–40)

## 2013-01-03 LAB — IFOBT (OCCULT BLOOD): IFOBT: NEGATIVE

## 2013-01-03 MED ORDER — LISINOPRIL 10 MG PO TABS: 10.0000 mg | ORAL_TABLET | Freq: Every day | ORAL | Status: DC

## 2013-01-03 NOTE — Progress Notes (Signed)
  Subjective:    Patient ID: Johnny Wilcox, male    DOB: 1951/05/27, 62 y.o.   MRN: [id number]  HPICPE 20lb wt loss -decreased interest and taste since all teeth removed and repl w/ full dentures--can't bite well///good energy// Stopped statins=too tired Stopped BP meds due to hypotens when working in heat//BP wnl over last 2 months til this week ED not present off meds noctur x 1 but hard to start str sitting down   colon'03 Dr Kaplan=diverticulosis immun UTD Past Surgical History  Procedure Laterality Date  . Eye surgery      child injury-repair.  Cataract Right  . Vasectomy    . Multiple extractions with alveoloplasty  12/06/2011    Procedure: MULTIPLE EXTRACION WITH ALVEOLOPLASTY;  Surgeon: Georgia Lopes, DDS;  Location: [facility] OR;  Service: Oral Surgery;  Laterality: Bilateral;  Biopsy tissue right vestibule    Review of Systems  Constitutional: Positive for appetite change and unexpected weight change.  HENT: Negative.   Eyes: Negative.   Respiratory: Negative.   Cardiovascular: Negative.   Gastrointestinal: Negative.   Endocrine: Negative.   Genitourinary: Positive for frequency.  Musculoskeletal: Positive for arthralgias.  Skin: Negative.   Allergic/Immunologic: Negative.   Neurological: Negative.   Hematological: Negative.   Psychiatric/Behavioral: Negative.        Objective:   Physical Exam  Constitutional: He is oriented to person, place, and time. He appears well-developed and well-nourished.  HENT:  Right Ear: External ear normal.  Left Ear: External ear normal.  Nose: Nose normal.  Mouth/Throat: Oropharynx is clear and moist.  Hearing intact grossly Tms clear  Eyes: Conjunctivae and EOM are normal. Pupils are equal, round, and reactive to light. Left eye exhibits no discharge.  Neck: Normal range of motion. Neck supple. No thyromegaly present.  Cardiovascular: Normal rate, regular rhythm, normal heart sounds and intact distal pulses.   No murmur  heard. Pulmonary/Chest: Effort normal and breath sounds normal. He has no rales.  Abdominal: Soft. Bowel sounds are normal. He exhibits no mass. There is no tenderness.  No organomegaly  Genitourinary: Rectum normal. Guaiac negative stool.  Prostate large firm symmetrical w/out nodules   Musculoskeletal: Normal range of motion. He exhibits no edema and no tenderness.  Lymphadenopathy:    He has no cervical adenopathy.  Neurological: He is alert and oriented to person, place, and time. He has normal reflexes. No cranial nerve deficit.  Skin: No rash noted. No erythema.  Psychiatric: He has a normal mood and affect. His behavior is normal. Judgment and thought content normal.      Assessment & Plan:  CPE  HTN_?overtreated  decr to lisin 10 w/out hct  Wt Loss-?dentures--labs sent  HL-he choses to avoid statins  BPH  Time for colonos#2

## 2013-01-05 ENCOUNTER — Other Ambulatory Visit: Payer: Self-pay

## 2013-01-05 DIAGNOSIS — I10 Essential (primary) hypertension: Secondary | ICD-10-CM

## 2013-01-05 MED ORDER — LISINOPRIL 10 MG PO TABS: 10.0000 mg | ORAL_TABLET | Freq: Every day | ORAL | Status: DC

## 2013-01-09 ENCOUNTER — Encounter: Payer: Self-pay | Admitting: Internal Medicine

## 2013-03-07 ENCOUNTER — Encounter: Payer: 59 | Admitting: Internal Medicine

## 2013-03-29 ENCOUNTER — Encounter: Payer: 59 | Admitting: Gastroenterology

## 2013-04-25 ENCOUNTER — Telehealth: Payer: Self-pay

## 2013-04-25 MED ORDER — TADALAFIL 20 MG PO TABS: 20.0000 mg | ORAL_TABLET | Freq: Every day | ORAL | Status: DC | PRN

## 2013-04-25 NOTE — Telephone Encounter (Signed)
[facility] is requesting a Rx for Cialis for pt. I have pended the last Rx written by Dr Merla Riches for review. Fax number is [phone].

## 2013-04-25 NOTE — Telephone Encounter (Signed)
Meds ordered this encounter  Medications  . tadalafil (CIALIS) 20 MG tablet    Sig: Take 1 tablet (20 mg total) by mouth daily as needed for erectile dysfunction. 3 month supply    Dispense:  15 tablet    Refill:  3

## 2013-04-26 ENCOUNTER — Other Ambulatory Visit: Payer: Self-pay | Admitting: *Deleted

## 2013-04-26 MED ORDER — TADALAFIL 20 MG PO TABS: 20.0000 mg | ORAL_TABLET | Freq: Every day | ORAL | Status: DC | PRN

## 2013-05-22 ENCOUNTER — Encounter: Payer: 59 | Admitting: Gastroenterology

## 2013-08-05 ENCOUNTER — Ambulatory Visit (INDEPENDENT_AMBULATORY_CARE_PROVIDER_SITE_OTHER): Payer: 59 | Admitting: Internal Medicine

## 2013-08-05 ENCOUNTER — Ambulatory Visit: Payer: 59

## 2013-08-05 VITALS — BP 140/88 | HR 43 | Temp 97.9°F | Resp 16 | Ht 68.0 in | Wt 211.6 lb

## 2013-08-05 DIAGNOSIS — Z6834 Body mass index (BMI) 34.0-34.9, adult: Secondary | ICD-10-CM

## 2013-08-05 DIAGNOSIS — E785 Hyperlipidemia, unspecified: Secondary | ICD-10-CM

## 2013-08-05 DIAGNOSIS — R209 Unspecified disturbances of skin sensation: Secondary | ICD-10-CM

## 2013-08-05 DIAGNOSIS — I498 Other specified cardiac arrhythmias: Secondary | ICD-10-CM

## 2013-08-05 DIAGNOSIS — I10 Essential (primary) hypertension: Secondary | ICD-10-CM

## 2013-08-05 DIAGNOSIS — R202 Paresthesia of skin: Secondary | ICD-10-CM

## 2013-08-05 DIAGNOSIS — R079 Chest pain, unspecified: Secondary | ICD-10-CM

## 2013-08-05 DIAGNOSIS — R5383 Other fatigue: Secondary | ICD-10-CM

## 2013-08-05 DIAGNOSIS — M25531 Pain in right wrist: Secondary | ICD-10-CM

## 2013-08-05 DIAGNOSIS — R001 Bradycardia, unspecified: Secondary | ICD-10-CM

## 2013-08-05 DIAGNOSIS — R5381 Other malaise: Secondary | ICD-10-CM

## 2013-08-05 DIAGNOSIS — M25539 Pain in unspecified wrist: Secondary | ICD-10-CM

## 2013-08-05 DIAGNOSIS — M25532 Pain in left wrist: Secondary | ICD-10-CM

## 2013-08-05 LAB — POCT CBC
GRANULOCYTE PERCENT: 60.8 % (ref 37–80)
HCT, POC: 44.1 % (ref 43.5–53.7)
Hemoglobin: 14 g/dL — AB (ref 14.1–18.1)
Lymph, poc: 1.7 (ref 0.6–3.4)
MCH, POC: 32.1 pg — AB (ref 27–31.2)
MCHC: 31.7 g/dL — AB (ref 31.8–35.4)
MCV: 101.2 fL — AB (ref 80–97)
MID (CBC): 0.5 (ref 0–0.9)
MPV: 10.4 fL (ref 0–99.8)
PLATELET COUNT, POC: 194 10*3/uL (ref 142–424)
POC Granulocyte: 3.4 (ref 2–6.9)
POC LYMPH PERCENT: 30.7 %L (ref 10–50)
POC MID %: 8.5 % (ref 0–12)
RBC: 4.36 M/uL — AB (ref 4.69–6.13)
RDW, POC: 13.6 %
WBC: 5.6 10*3/uL (ref 4.6–10.2)

## 2013-08-05 LAB — COMPREHENSIVE METABOLIC PANEL
ALK PHOS: 63 U/L (ref 39–117)
ALT: 22 U/L (ref 0–53)
AST: 18 U/L (ref 0–37)
Albumin: 4.5 g/dL (ref 3.5–5.2)
BILIRUBIN TOTAL: 0.5 mg/dL (ref 0.2–1.2)
BUN: 16 mg/dL (ref 6–23)
CO2: 31 mEq/L (ref 19–32)
Calcium: 9.3 mg/dL (ref 8.4–10.5)
Chloride: 103 mEq/L (ref 96–112)
Creat: 0.94 mg/dL (ref 0.50–1.35)
Glucose, Bld: 96 mg/dL (ref 70–99)
Potassium: 4.2 mEq/L (ref 3.5–5.3)
SODIUM: 138 meq/L (ref 135–145)
TOTAL PROTEIN: 7 g/dL (ref 6.0–8.3)

## 2013-08-05 LAB — LIPID PANEL
Cholesterol: 173 mg/dL (ref 0–200)
HDL: 39 mg/dL — ABNORMAL LOW (ref >39)
LDL CALC: 115 mg/dL — AB (ref 0–99)
Total CHOL/HDL Ratio: 4.4 Ratio
Triglycerides: 97 mg/dL (ref <150)
VLDL: 19 mg/dL (ref 0–40)

## 2013-08-05 LAB — TSH: TSH: 1.637 u[IU]/mL (ref 0.350–4.500)

## 2013-08-05 LAB — T4, FREE: FREE T4: 1.07 ng/dL (ref 0.80–1.80)

## 2013-08-05 NOTE — Progress Notes (Addendum)
Subjective:    Patient ID: Johnny Wilcox, male    DOB: 06-Jun-1951, 63 y.o.   MRN: [id number]  HPI Chief Complaint  Patient presents with  . Arm Pain    Both x 2 wks; No falls/injuries  . Extremity Weakness    Arms - Both x 2 wks  . Bleeding/Bruising    Left upper arm - Pt states he does not know how it got there    This chart was scribed for [name], MD by Thea Alken, ED Scribe. This patient was seen in room 3 and the patient's care was started at 11:42 AM.  HPI Comments: Johnny Wilcox is a 63 y.o. male who presents to the [facility] complaining of worsening. bilateral arm pain with associated weakness onset 3 weeks ago. He reports that his arms are weak but the left is worse than right. He reports that it is painful to make a fist and that he was unable to sleep at times in the past 2 weeks due to the pain. He reports that he feels pain from his thumb to his shoulder. Her report that his left arm feels bruised. He also reports tingling in his finger and legs while relaxing. He wakes at night with numbness in his hands. He reports tingling in his legs when her crosses them. He states that working relieves that pain. He denies falls, injuries, or swelling. He denies swelling in leg but does have leg aches.  Pt also complains of CP and describes the pain as stabbing. He states that it feel like his chest is ripping apart. He reports chest pain while under exertion and pulling objects such as trash cans. He also reports chest pain with intercourse.  He denies movement of the neck contributing to arm pain or CP. He denies SOB, nausea, emesis, and sweats. Chest pain is not associated with diaphoresis nausea or vomiting.  Oddly enough, he was able to shovel snow for 3 hours his morning living in an over similar lives without any chest pain or fatigue. He feel like he has been more tired than usual for the past 2 or 3 months but has continued to work every  day.  Patient Active Problem List   Diagnosis Date Noted  . ED (erectile dysfunction) 04/04/2012  . HTN (hypertension) 03/22/2012  . BMI 34.0-34.9,adult 03/22/2012  . Diverticulosis Colonoscopy 2003 03/22/2012  . BPH (benign prostatic hyperplasia)-mild July 2003 03/22/2012  . Hyperlipidemia-Mild 03/22/2012    No Known Allergies Prior to Admission medications   Medication Sig Start Date End Date Taking? Authorizing Provider  lisinopril (PRINIVIL,ZESTRIL) 10 MG tablet Take 1 tablet (10 mg total) by mouth daily. 01/05/13  Yes Leandrew Koyanagi, MD  tadalafil (CIALIS) 20 MG tablet Take 1 tablet (20 mg total) by mouth daily as needed for erectile dysfunction. 3 month supply 04/26/13  Yes Leandrew Koyanagi, MD     Review of Systems  Constitutional: Negative for fever, chills and diaphoresis.  Respiratory: Negative for shortness of breath.   Cardiovascular: Positive for chest pain. Negative for leg swelling.  Gastrointestinal: Negative for nausea, vomiting and diarrhea.  Musculoskeletal: Positive for arthralgias and myalgias. Negative for neck pain and neck stiffness.  Neurological: Positive for weakness.   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 Constitutional: He is oriented to person, place, and  time. He appears well-developed and well-nourished. No distress.  HENT:  Head: Normocephalic and atraumatic.  Nose: Nose normal.  Mouth/Throat: Oropharynx is clear and moist.  Eyes: Conjunctivae and EOM are normal. Pupils are equal, round, and reactive to light.  Neck: Normal range of motion. Neck supple. No thyromegaly present.  No carotid bruit  Cardiovascular: Regular rhythm, normal heart sounds and intact distal pulses.   No murmur heard. Significant bradycardia with a rate of 40  Pulmonary/Chest: Effort normal and breath sounds normal. No respiratory distress.   Abdominal: Soft. Bowel sounds are normal. There is no tenderness.  Musculoskeletal:  His shoulders have good range of motion without pain There is tenderness on palpation of both CMC joints with some pain with thumb range of motion. Supination and pronation produce discomfort in the wrist. No swelling or redness. Tinel's is markedly positive in both wrists And hyperextension produces some tingling Neck range of motion does not produce any symptoms in the arms or hands Grip is full Finger thumb opposition intact without sensory or motor losses in  Hands  Hip exam shows good range of motion Nontender lumbar area with negative straight leg raise bilaterally Deep tendon reflexes are intact  Lymphadenopathy:    He has no cervical adenopathy.  Neurological: He is alert and oriented to person, place, and time. He has normal reflexes. No cranial nerve deficit.  Skin: Skin is warm and dry.  Psychiatric: He has a normal mood and affect. His behavior is normal. Judgment and thought content normal.   Filed Vitals:   08/05/13 1048  BP: 140/88  Pulse: 43  Temp: 97.9 F (36.6 C)  Resp: 16   Results for orders placed in visit on 08/05/13  POCT CBC      Result Value Ref Range   WBC 5.6  4.6 - 10.2 K/uL   Lymph, poc 1.7  0.6 - 3.4   POC LYMPH PERCENT 30.7  10 - 50 %L   MID (cbc) 0.5  0 - 0.9   POC MID % 8.5  0 - 12 %M   POC Granulocyte 3.4  2 - 6.9   Granulocyte percent 60.8  37 - 80 %G   RBC 4.36 (*) 4.69 - 6.13 M/uL   Hemoglobin 14.0 (*) 14.1 - 18.1 g/dL   HCT, POC 44.1  43.5 - 53.7 %   MCV 101.2 (*) 80 - 97 fL   MCH, POC 32.1 (*) 27 - 31.2 pg   MCHC 31.7 (*) 31.8 - 35.4 g/dL   RDW, POC 13.6     Platelet Count, POC 194  142 - 424 K/uL   MPV 10.4  0 - 99.8 fL   [facility] reading (PRIMARY) by  Dr.Arneshia Ade=cardiomegaly/heart more prominent than xray 2 y ago  Ekg=sinus brady--same as 2013    Assessment & Plan:  Chest pain -exertional including intercourse- Plan: DG Chest 2 View, EKG  12-Lead  Even though he can't clearly undertake extreme exertion without recreating his symptoms, his chest pain with  intercourse commands further investigation to rule out a critical lesion in the coronary arteries  His father died of a massive heart attack  Fatigue - Plan: POCT CBC, TSH, T4, free, Comprehensive metabolic panel, Lipid panel  Bradycardia -long-standing and asymptomatic  Paresthesias-?CTS--- will refer to orthopedics  Pain arms of unclear etiology Pain legs unclear etiology History of mild hyperlipidemia Hypertension Overweight   I have completed the patient encounter in its entirety as documented by the scribe, with editing by me where  necessary. Rama Sorci P. Laney Pastor, M.D.  Addendum Results for orders placed in visit on 08/05/13  TSH      Result Value Ref Range   TSH 1.637  0.350 - 4.500 uIU/mL  T4, FREE      Result Value Ref Range   Free T4 1.07  0.80 - 1.80 ng/dL  COMPREHENSIVE METABOLIC PANEL      Result Value Ref Range   Sodium 138  135 - 145 mEq/L   Potassium 4.2  3.5 - 5.3 mEq/L   Chloride 103  96 - 112 mEq/L   CO2 31  19 - 32 mEq/L   Glucose, Bld 96  70 - 99 mg/dL   BUN 16  6 - 23 mg/dL   Creat 0.94  0.50 - 1.35 mg/dL   Total Bilirubin 0.5  0.2 - 1.2 mg/dL   Alkaline Phosphatase 63  39 - 117 U/L   AST 18  0 - 37 U/L   ALT 22  0 - 53 U/L   Total Protein 7.0  6.0 - 8.3 g/dL   Albumin 4.5  3.5 - 5.2 g/dL   Calcium 9.3  8.4 - 10.5 mg/dL  LIPID PANEL      Result Value Ref Range   Cholesterol 173  0 - 200 mg/dL   Triglycerides 97  <150 mg/dL   HDL 39 (*) >39 mg/dL   Total CHOL/HDL Ratio 4.4     VLDL 19  0 - 40 mg/dL   LDL Cholesterol 115 (*) 0 - 99 mg/dL

## 2013-08-07 ENCOUNTER — Encounter: Payer: Self-pay | Admitting: Internal Medicine

## 2013-08-22 ENCOUNTER — Ambulatory Visit (INDEPENDENT_AMBULATORY_CARE_PROVIDER_SITE_OTHER): Payer: 59 | Admitting: Cardiology

## 2013-08-22 ENCOUNTER — Encounter: Payer: Self-pay | Admitting: Cardiology

## 2013-08-22 VITALS — BP 190/96 | HR 66 | Ht 69.0 in | Wt 216.4 lb

## 2013-08-22 DIAGNOSIS — R079 Chest pain, unspecified: Secondary | ICD-10-CM | POA: Insufficient documentation

## 2013-08-22 NOTE — Patient Instructions (Signed)
Schedule Stress Myoview follow instructions given   Your physician recommends that you schedule a follow-up appointment after Myoview

## 2013-08-22 NOTE — Progress Notes (Signed)
Patient ID: Johnny Wilcox, male   DOB: 1950-09-27, 63 y.o.   MRN: [id number]     Patient Name: Johnny Wilcox Date of Encounter: 08/22/2013  Primary Care Provider:  Leandrew Koyanagi, MD Primary Cardiologist:  Dorothy Spark  Problem List   Past Medical History  Diagnosis Date  . Hypertension    Past Surgical History  Procedure Laterality Date  . Eye surgery      child injury-repair.  Cataract Right  . Vasectomy    . Multiple extractions with alveoloplasty  12/06/2011    Procedure: MULTIPLE EXTRACION WITH ALVEOLOPLASTY;  Surgeon: [name], DDS;  Location: [facility];  Service: Oral Surgery;  Laterality: Bilateral;  Biopsy tissue right vestibule    Allergies  No Known Allergies  HPI  [name] is a 63 y.o. male with prior medical history of hypertension and erectile dysfunction was coming for concerns of chest pain. He originally presented to his primary care physician with concern of bilateral wrist pain that was constant result 2 weeks later. 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 At the same time he complained of chest pain that he experiences occasionally on exertion, at the same time he can work manually for multiple hours and on experience chest pain. He experiences chest pain frequently with sexual intercourse and describes his pain as retrosternal and sharp and can not be associated with any other symptoms such as shortness of breath or palpitations. He states that last summer his blood pressure was low and his blood pressure medication was cut in half because of symptoms of dizziness. He has significant family history of coronary artery disease, his father had 2 myocardial infarctions in his 77s and eventually died of heart attack at age of 97. His mom died of heart attack in her early 38s.  Home Medications  Prior to Admission medications   Medication Sig Start Date End Date Taking? Authorizing Provider  lisinopril  (PRINIVIL,ZESTRIL) 10 MG tablet Take 1 tablet (10 mg total) by mouth daily. 01/05/13  Yes Leandrew Koyanagi, MD  tadalafil (CIALIS) 20 MG tablet Take 1 tablet (20 mg total) by mouth daily as needed for erectile dysfunction. 3 month supply 04/26/13  Yes Leandrew Koyanagi, MD    Family History  Family History  Problem Relation Age of Onset  . Other Neg Hx   . Heart disease Father     Social History  History   Social History  . Marital Status: Married    Spouse Name: N/A    Number of Children: N/A  . Years of Education: N/A   Occupational History  . Not on file.   Social History Main Topics  . Smoking status: Never Smoker   . Smokeless tobacco: Current User    Last Attempt to Quit: 09/06/2011  . Alcohol Use: Yes     Comment:  maybe 3 beers a month  . Drug Use: No  . Sexual Activity: Not on file   Other Topics Concern  . Not on file   Social History Narrative  . No narrative on file     Review of Systems, as per HPI, otherwise negative General:  No chills, fever, night sweats or weight changes.  Cardiovascular:  No chest pain, dyspnea on exertion, edema, orthopnea, palpitations, paroxysmal nocturnal dyspnea. Dermatological: No rash, lesions/masses Respiratory: No cough, dyspnea Urologic: No hematuria, dysuria Abdominal:   No nausea, vomiting, diarrhea, bright red blood per rectum, melena,  or hematemesis Neurologic:  No visual changes, wkns, changes in mental status. All other systems reviewed and are otherwise negative except as noted above.  Physical Exam  Blood pressure 190/96, pulse 66, height 5\' 9"  (1.753 m), weight 216 lb 6.4 oz (98.158 kg).  General: Pleasant, NAD Psych: Normal affect. Neuro: Alert and oriented X 3. Moves all extremities spontaneously. HEENT: Normal  Neck: Supple without bruits or JVD. Lungs:  Resp regular and unlabored, CTA. Heart: RRR no s3, s4, or murmurs. Abdomen: Soft, non-tender, non-distended, BS + x 4.  Extremities: No  clubbing, cyanosis or edema. DP/PT/Radials 2+ and equal bilaterally.  Labs:  No results found for this basename: CKTOTAL, CKMB, TROPONINI,  in the last 72 hours Lab Results  Component Value Date   WBC 5.6 08/05/2013   HGB 14.0* 08/05/2013   HCT 44.1 08/05/2013   MCV 101.2* 08/05/2013   PLT 222 01/03/2013   No results found for this basename: NA, K, CL, CO2, BUN, CREATININE, CALCIUM, LABALBU, PROT, BILITOT, ALKPHOS, ALT, AST, GLUCOSE,  in the last 168 hours Lab Results  Component Value Date   CHOL 173 08/05/2013   HDL 39* 08/05/2013   LDLCALC 115* 08/05/2013   TRIG 97 08/05/2013   Accessory Clinical Findings  Echocardiogram - none  ECG - sinus bradycardia 46 beats per minute negative T waves in inferior lead   Assessment & Plan  63 year old male   1. Chest pian - abnormal ECG and risk factors, we will order exercise nuclear stress test  2. Hypertension - the patient states this is unusual for him, we will recheck at stress test and response to exercise, no change to medication at this point as he previously experienced episodes of hypotension. However these might have been also associated with dehydration and these have been on hold summer day and he was working on the roof.  3. Hyperlipidemia - LDL 119 medical bill 100. He is advised about diet and seems to be there is a lot of room for improvement she enjoys eating fried food and he didn't regular basis.  Followup in 4 weeks.  Dorothy Spark, MD, [facility] 08/22/2013, 3:24 PM

## 2013-08-24 ENCOUNTER — Ambulatory Visit (INDEPENDENT_AMBULATORY_CARE_PROVIDER_SITE_OTHER): Payer: 59 | Admitting: Internal Medicine

## 2013-08-24 VITALS — BP 158/84 | HR 49 | Temp 98.5°F | Resp 14 | Ht 71.0 in | Wt 221.0 lb

## 2013-08-24 DIAGNOSIS — I10 Essential (primary) hypertension: Secondary | ICD-10-CM

## 2013-08-24 NOTE — Progress Notes (Signed)
   Subjective:    Patient ID: Johnny Wilcox, male    DOB: 1950/08/30, 63 y.o.   MRN: [id number]  Chief Complaint  Patient presents with  . Hypertension   This chart was scribed for [name], MD by Zettie Pho, ED Scribe.   HPI Johnny Wilcox is a 63 y.o. With a history of HTN with mild hyperlipidemia who presents to [facility] complaining of HTN (BP 158/84 measured here). Patient was seen by his cardiologist to schedule a stress test, but was noted to have an elevated blood pressure at that time (190/94 per patient) and was advised to follow up to better manage his HTN prior to the test. Patient is on 10 mg lisinopril daily, but states that he has not taken it for the past 3-4 weeks up until yesterday (he also has not taken it today). Patient has no other pertinent medical history.   Patient Active Problem List   Diagnosis Date Noted  . Chest pain 08/22/2013  . ED (erectile dysfunction) 04/04/2012  . HTN (hypertension) 03/22/2012  . BMI 34.0-34.9,adult 03/22/2012  . Diverticulosis Colonoscopy 2003 03/22/2012  . BPH (benign prostatic hyperplasia)-mild July 2003 03/22/2012  . Hyperlipidemia-Mild 03/22/2012   Past Medical History  Diagnosis Date  . Hypertension    Current Outpatient Prescriptions on File Prior to Visit  Medication Sig Dispense Refill  . lisinopril (PRINIVIL,ZESTRIL) 10 MG tablet Take 1 tablet (10 mg total) by mouth daily.  90 tablet  3  . tadalafil (CIALIS) 20 MG tablet Take 1 tablet (20 mg total) by mouth daily as needed for erectile dysfunction. 3 month supply  15 tablet  3   No current facility-administered medications on file prior to visit.   No Known Allergies  Review of Systems   As noted in the HPI, otherwise non-contributory.   Objective:   Physical Exam  Nursing note and vitals reviewed. Constitutional: He is oriented to person, place, and time. He appears well-developed and well-nourished. No distress.  HENT:  Head:  Normocephalic and atraumatic.  Eyes: Conjunctivae are normal. Pupils are equal, round, and reactive to light.  Neck: Normal range of motion. Neck supple.  Cardiovascular: Normal rate, regular rhythm, normal heart sounds and intact distal pulses.   No murmur heard. Pulmonary/Chest: Effort normal. No respiratory distress.  Musculoskeletal: Normal range of motion.  Neurological: He is alert and oriented to person, place, and time.  Skin: Skin is warm and dry.  Psychiatric: He has a normal mood and affect. His behavior is normal.      BP 158/84  Pulse 49  Temp(Src) 98.5 F (36.9 C)  Resp 14  Ht 5\' 11"  (1.803 m)  Wt 221 lb (100.245 kg)  BMI 30.84 kg/m2  SpO2 98%  Assessment & Plan:  5:40 PM- Advised patient to take his lisinopril daily and to record his blood pressure regularly for 2 weeks. Discussed treatment plan with patient at bedside and patient verbalized agreement.   I have completed the patient encounter in its entirety as documented by the scribe, with editing by me where necessary. Llesenia Fogal P. Laney Pastor, M.D. Unspecified essential hypertension  with loss of control due to to noncompliance/forgetfulness  He will continue home blood pressures on daily basis by his daughter who is in the EMS, and he will take his medicines every day palliating him with his dentures

## 2013-08-28 ENCOUNTER — Encounter: Payer: Self-pay | Admitting: Internal Medicine

## 2013-09-05 ENCOUNTER — Encounter: Payer: Self-pay | Admitting: Internal Medicine

## 2013-09-06 ENCOUNTER — Ambulatory Visit (HOSPITAL_COMMUNITY): Payer: 59 | Attending: Cardiology | Admitting: Radiology

## 2013-09-06 VITALS — BP 117/80 | HR 46 | Ht 71.0 in | Wt 210.0 lb

## 2013-09-06 DIAGNOSIS — R0602 Shortness of breath: Secondary | ICD-10-CM

## 2013-09-06 DIAGNOSIS — R079 Chest pain, unspecified: Secondary | ICD-10-CM | POA: Insufficient documentation

## 2013-09-06 MED ORDER — TECHNETIUM TC 99M SESTAMIBI GENERIC - CARDIOLITE
33.0000 | Freq: Once | INTRAVENOUS | Status: AC | PRN
  Administered 2013-09-06: 33 via INTRAVENOUS

## 2013-09-06 MED ORDER — TECHNETIUM TC 99M SESTAMIBI GENERIC - CARDIOLITE
11.0000 | Freq: Once | INTRAVENOUS | Status: AC | PRN
  Administered 2013-09-06: 11 via INTRAVENOUS

## 2013-09-06 NOTE — Progress Notes (Addendum)
Johnny Wilcox [address] NUCLEAR MED [address] [place], [place] [address] [phone]    Cardiology Nuclear Med Study  Johnny Wilcox is a 63 y.o. male     MRN : [id number]     DOB: [birth year]  Procedure Date: 09/06/2013  Nuclear Med Background Indication for Stress Test:  Evaluation for Ischemia History:  No known CAD Cardiac Risk Factors: Family History - CAD, History of Smoking, Hypertension and Lipids  Symptoms:  Chest Pain, Chest Pain with Exertion (last date of chest discomfort was one month ago) and SOB   Nuclear Pre-Procedure Caffeine/Decaff Intake:  None > 12 hrs NPO After: 7:00pm   Lungs:  clear O2 Sat: 98% on room air. IV 0.9% NS with Angio Cath:  20g  IV Site: R Antecubital x 1, tolerated well IV Started by:  Irven Baltimore, RN  Chest Size (in):  40 Cup Size: n/a  Height: 5\' 11"  (1.803 m)  Weight:  210 lb (95.255 kg)  BMI:  Body mass index is 29.3 kg/(m^2). Tech Comments:  N/A    Nuclear Med Study 1 or 2 day study: 1 day  Stress Test Type:  Stress  Reading MD: N/A  Order Authorizing Provider:  Ena Dawley, MD  Resting Radionuclide: Technetium 32m Sestamibi  Resting Radionuclide Dose: 11.0 mCi   Stress Radionuclide:  Technetium 58m Sestamibi  Stress Radionuclide Dose: 33.0 mCi           Stress Protocol Rest HR: 46 Stress HR: 141  Rest BP: 117/80 Stress BP: 145/103  Exercise Time (min): 8:30 METS: 10.1           Dose of Adenosine (mg):  n/a Dose of Lexiscan: n/a mg  Dose of Atropine (mg): n/a Dose of Dobutamine: n/a mcg/kg/min (at max HR)  Stress Test Technologist: Glade Lloyd, BS-ES  Nuclear Technologist:  Annye Rusk, CNMT     Rest Procedure:  Myocardial perfusion imaging was performed at rest 45 minutes following the intravenous administration of Technetium 55m Sestamibi. Rest ECG: NSR with non-specific ST-T wave changes  Stress Procedure:  The patient exercised on the treadmill utilizing the Bruce Protocol for 8:30 minutes. The  patient stopped due to fatigue and 1-2/10 chest tightness.  Technetium 80m Sestamibi was injected at peak exercise and myocardial perfusion imaging was performed after a brief delay.  Just into the recovery the patient complained of pain shooting down his right arm into his fingers.  This resolved about 2:30 minutes into recovery.  Stress ECG: With stress nonspecific ST changes become more prominent.  QPS Raw Data Images:  Normal; no motion artifact; normal heart/lung ratio. Stress Images:  Normal homogeneous uptake in all areas of the myocardium. Rest Images:  Normal homogeneous uptake in all areas of the myocardium. Subtraction (SDS):  No evidence of ischemia. Transient Ischemic Dilatation (Normal <1.22):  0.93 Lung/Heart Ratio (Normal <0.45):  0.39  Quantitative Gated Spect Images QGS EDV:  125 ml QGS ESV:  57 ml  Impression Exercise Capacity:  Good exercise capacity. BP Response:  Normal blood pressure response. Clinical Symptoms:  Chest tightness 1-2/10 ECG Impression:  With stress there is 1 mm ST depression in inferolateral leads. Comparison with Prior Nuclear Study: No images to compare  Overall Impression:  Low risk stress nuclear study.  Mild chest discomfort. Mild ST depression at peak exercise, increased from nonspecific STT changes present on resting tracing. Perfusion imaging does not reveal any definite reversible areas of ischemia or scar. On stress images there appears to  be mild diaphragmatic attenuation although I cannot exclude a small area of reversible midinferior ischemia.  LV Ejection Fraction: 55%.  LV Wall Motion:  NL LV Function; NL Wall Motion  [employer]

## 2013-09-19 ENCOUNTER — Other Ambulatory Visit: Payer: Self-pay | Admitting: Radiology

## 2013-09-19 ENCOUNTER — Other Ambulatory Visit: Payer: Self-pay

## 2013-09-19 ENCOUNTER — Telehealth: Payer: Self-pay | Admitting: Radiology

## 2013-09-19 ENCOUNTER — Other Ambulatory Visit (INDEPENDENT_AMBULATORY_CARE_PROVIDER_SITE_OTHER): Payer: 59

## 2013-09-19 ENCOUNTER — Encounter (HOSPITAL_COMMUNITY): Payer: Self-pay | Admitting: Pharmacy Technician

## 2013-09-19 DIAGNOSIS — R9439 Abnormal result of other cardiovascular function study: Secondary | ICD-10-CM

## 2013-09-19 LAB — CBC WITH DIFFERENTIAL/PLATELET
Basophils Absolute: 0 10*3/uL (ref 0.0–0.1)
Basophils Relative: 0.6 % (ref 0.0–3.0)
Eosinophils Absolute: 0.2 10*3/uL (ref 0.0–0.7)
Eosinophils Relative: 3.2 % (ref 0.0–5.0)
HCT: 37.8 % — ABNORMAL LOW (ref 39.0–52.0)
Hemoglobin: 12.9 g/dL — ABNORMAL LOW (ref 13.0–17.0)
Lymphocytes Relative: 24.8 % (ref 12.0–46.0)
Lymphs Abs: 1.2 10*3/uL (ref 0.7–4.0)
MCHC: 34 g/dL (ref 30.0–36.0)
MCV: 93.9 fl (ref 78.0–100.0)
Monocytes Absolute: 0.5 10*3/uL (ref 0.1–1.0)
Monocytes Relative: 9.9 % (ref 3.0–12.0)
Neutro Abs: 3.1 10*3/uL (ref 1.4–7.7)
Neutrophils Relative %: 61.5 % (ref 43.0–77.0)
Platelets: 219 10*3/uL (ref 150.0–400.0)
RBC: 4.03 Mil/uL — ABNORMAL LOW (ref 4.22–5.81)
RDW: 13.1 % (ref 11.5–14.6)
WBC: 5 10*3/uL (ref 4.5–10.5)

## 2013-09-19 LAB — BASIC METABOLIC PANEL
BUN: 15 mg/dL (ref 6–23)
CO2: 27 mEq/L (ref 19–32)
Calcium: 9.1 mg/dL (ref 8.4–10.5)
Chloride: 103 mEq/L (ref 96–112)
Creatinine, Ser: 1 mg/dL (ref 0.4–1.5)
GFR: 82.82 mL/min (ref >60.00)
Glucose, Bld: 104 mg/dL — ABNORMAL HIGH (ref 70–99)
Potassium: 3.8 mEq/L (ref 3.5–5.1)
Sodium: 137 mEq/L (ref 135–145)

## 2013-09-19 LAB — PROTIME-INR
INR: 1.1 ratio — ABNORMAL HIGH (ref 0.8–1.0)
Prothrombin Time: 11.3 s (ref 10.2–12.4)

## 2013-09-19 NOTE — Telephone Encounter (Signed)
I have called patients wife, Mechele Claude to advise. She states she is disappointed the Cardiologist did not advise. I have told her I have sent message to cardiology as well.

## 2013-09-19 NOTE — Telephone Encounter (Signed)
Patients wife states he is to have a cardiac Cath soon, but does not know results of the stress test, wife JoAnne phone [phone], she is asking what Cath is needed for , and what the results of stress test was.  Please advise.

## 2013-09-19 NOTE — Telephone Encounter (Signed)
Dr Mare Ferrari saw an area of decreased blood flow at the bottom of the heart that may be a blockage

## 2013-09-19 NOTE — Addendum Note (Signed)
Addended by: Dorothy Spark on: 09/19/2013 11:45 PM   Modules accepted: Orders

## 2013-09-21 ENCOUNTER — Encounter (HOSPITAL_COMMUNITY): Payer: Self-pay | Admitting: *Deleted

## 2013-09-21 ENCOUNTER — Ambulatory Visit (HOSPITAL_COMMUNITY)
Admission: RE | Admit: 2013-09-21 | Discharge: 2013-09-22 | Disposition: A | Discharge status: Home / Self Care | Payer: 59 | Source: Ambulatory Visit | Attending: Cardiology | Admitting: Cardiology

## 2013-09-21 ENCOUNTER — Encounter (HOSPITAL_COMMUNITY)
Admission: RE | Disposition: A | Discharge status: Home / Self Care | Payer: Self-pay | Source: Ambulatory Visit | Attending: Cardiology

## 2013-09-21 DIAGNOSIS — E785 Hyperlipidemia, unspecified: Secondary | ICD-10-CM | POA: Insufficient documentation

## 2013-09-21 DIAGNOSIS — N529 Male erectile dysfunction, unspecified: Secondary | ICD-10-CM | POA: Insufficient documentation

## 2013-09-21 DIAGNOSIS — I10 Essential (primary) hypertension: Secondary | ICD-10-CM

## 2013-09-21 DIAGNOSIS — I2510 Atherosclerotic heart disease of native coronary artery without angina pectoris: Secondary | ICD-10-CM

## 2013-09-21 DIAGNOSIS — Z8249 Family history of ischemic heart disease and other diseases of the circulatory system: Secondary | ICD-10-CM | POA: Insufficient documentation

## 2013-09-21 DIAGNOSIS — I209 Angina pectoris, unspecified: Secondary | ICD-10-CM | POA: Diagnosis present

## 2013-09-21 DIAGNOSIS — R079 Chest pain, unspecified: Secondary | ICD-10-CM

## 2013-09-21 HISTORY — PX: PERCUTANEOUS CORONARY STENT INTERVENTION (PCI-S): SHX5485

## 2013-09-21 HISTORY — PX: LEFT HEART CATHETERIZATION WITH CORONARY ANGIOGRAM: SHX5451

## 2013-09-21 LAB — POCT ACTIVATED CLOTTING TIME
Activated Clotting Time: 205 seconds
Activated Clotting Time: 730 seconds

## 2013-09-21 SURGERY — LEFT HEART CATHETERIZATION WITH CORONARY ANGIOGRAM
Anesthesia: LOCAL

## 2013-09-21 MED ORDER — LIDOCAINE HCL (PF) 1 % IJ SOLN
INTRAMUSCULAR | Status: AC
  Filled 2013-09-21: qty 30

## 2013-09-21 MED ORDER — ATORVASTATIN CALCIUM 80 MG PO TABS
80.0000 mg | ORAL_TABLET | Freq: Every day | ORAL | Status: DC
  Administered 2013-09-21: 19:00:00 80 mg via ORAL
  Filled 2013-09-21 (×2): qty 1

## 2013-09-21 MED ORDER — SODIUM CHLORIDE 0.9 % IV SOLN: 250.0000 mL | INTRAVENOUS | Status: DC | PRN

## 2013-09-21 MED ORDER — SODIUM CHLORIDE 0.9 % IV SOLN
INTRAVENOUS | Status: DC
  Administered 2013-09-21: 08:00:00 via INTRAVENOUS

## 2013-09-21 MED ORDER — ASPIRIN 81 MG PO CHEW
81.0000 mg | CHEWABLE_TABLET | Freq: Every day | ORAL | Status: DC
  Administered 2013-09-22: 81 mg via ORAL
  Filled 2013-09-21: qty 1

## 2013-09-21 MED ORDER — SODIUM CHLORIDE 0.9 % IJ SOLN: 3.0000 mL | Freq: Two times a day (BID) | INTRAMUSCULAR | Status: DC

## 2013-09-21 MED ORDER — ACETAMINOPHEN 325 MG PO TABS
650.0000 mg | ORAL_TABLET | ORAL | Status: DC | PRN
  Administered 2013-09-21: 15:00:00 650 mg via ORAL
  Filled 2013-09-21: qty 2

## 2013-09-21 MED ORDER — TICAGRELOR 90 MG PO TABS
ORAL_TABLET | ORAL | Status: AC
  Filled 2013-09-21: qty 1

## 2013-09-21 MED ORDER — VERAPAMIL HCL 2.5 MG/ML IV SOLN
INTRAVENOUS | Status: AC
Start: 2013-09-21 — End: 2013-09-21
  Filled 2013-09-21: qty 2

## 2013-09-21 MED ORDER — SODIUM CHLORIDE 0.9 % IV SOLN: 1.0000 mL/kg/h | INTRAVENOUS | Status: AC

## 2013-09-21 MED ORDER — HEPARIN (PORCINE) IN NACL 2-0.9 UNIT/ML-% IJ SOLN
INTRAMUSCULAR | Status: AC
  Filled 2013-09-21: qty 1000

## 2013-09-21 MED ORDER — MIDAZOLAM HCL 2 MG/2ML IJ SOLN
INTRAMUSCULAR | Status: AC
  Filled 2013-09-21: qty 2

## 2013-09-21 MED ORDER — SODIUM CHLORIDE 0.9 % IJ SOLN: 3.0000 mL | INTRAMUSCULAR | Status: DC | PRN

## 2013-09-21 MED ORDER — LISINOPRIL 10 MG PO TABS
10.0000 mg | ORAL_TABLET | Freq: Every day | ORAL | Status: DC
  Administered 2013-09-21 – 2013-09-22 (×2): 10 mg via ORAL
  Filled 2013-09-21 (×3): qty 1

## 2013-09-21 MED ORDER — ASPIRIN 81 MG PO CHEW
CHEWABLE_TABLET | ORAL | Status: AC
Start: 2013-09-21 — End: 2013-09-21
  Administered 2013-09-21: 81 mg via ORAL
  Filled 2013-09-21: qty 1

## 2013-09-21 MED ORDER — HEPARIN SODIUM (PORCINE) 1000 UNIT/ML IJ SOLN
INTRAMUSCULAR | Status: AC
  Filled 2013-09-21: qty 1

## 2013-09-21 MED ORDER — TICAGRELOR 90 MG PO TABS
90.0000 mg | ORAL_TABLET | Freq: Two times a day (BID) | ORAL | Status: DC
  Administered 2013-09-21 – 2013-09-22 (×2): 90 mg via ORAL
  Filled 2013-09-21 (×3): qty 1

## 2013-09-21 MED ORDER — FENTANYL CITRATE 0.05 MG/ML IJ SOLN
INTRAMUSCULAR | Status: AC
  Filled 2013-09-21: qty 2

## 2013-09-21 MED ORDER — ASPIRIN 81 MG PO CHEW
81.0000 mg | CHEWABLE_TABLET | ORAL | Status: AC
  Administered 2013-09-21: 81 mg via ORAL

## 2013-09-21 MED ORDER — NITROGLYCERIN 0.2 MG/ML ON CALL CATH LAB
INTRAVENOUS | Status: AC
  Filled 2013-09-21: qty 1

## 2013-09-21 NOTE — H&P (View-Only) (Signed)
Patient ID: Johnny Wilcox, male   DOB: 1951/04/05, 63 y.o.   MRN: [id number]     Patient Name: Johnny Wilcox Date of Encounter: 08/22/2013  Primary Care Provider:  Leandrew Koyanagi, MD Primary Cardiologist:  Johnny Wilcox  Problem List   Past Medical History  Diagnosis Date  . Hypertension    Past Surgical History  Procedure Laterality Date  . Eye surgery      child injury-repair.  Cataract Right  . Vasectomy    . Multiple extractions with alveoloplasty  12/06/2011    Procedure: MULTIPLE EXTRACION WITH ALVEOLOPLASTY;  Surgeon: [name], DDS;  Location: [facility];  Service: Oral Surgery;  Laterality: Bilateral;  Biopsy tissue right vestibule    Allergies  No Known Allergies  HPI  Johnny Wilcox is a 63 y.o. male with prior medical history of hypertension and erectile dysfunction was coming for concerns of chest pain. He originally presented to his primary care physician with concern of bilateral wrist pain that was constant result 2 weeks later. 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 At the same time he complained of chest pain that he experiences occasionally on exertion, at the same time he can work manually for multiple hours and on experience chest pain. He experiences chest pain frequently with sexual intercourse and describes his pain as retrosternal and sharp and can not be associated with any other symptoms such as shortness of breath or palpitations. He states that last summer his blood pressure was low and his blood pressure medication was cut in half because of symptoms of dizziness. He has significant family history of coronary artery disease, his father had 2 myocardial infarctions in his 5s and eventually died of heart attack at age of 42. His mom died of heart attack in her early 58s.  Home Medications  Prior to Admission medications   Medication Sig Start Date End Date Taking? Authorizing Provider  lisinopril  (PRINIVIL,ZESTRIL) 10 MG tablet Take 1 tablet (10 mg total) by mouth daily. 01/05/13  Yes Johnny Koyanagi, MD  tadalafil (CIALIS) 20 MG tablet Take 1 tablet (20 mg total) by mouth daily as needed for erectile dysfunction. 3 month supply 04/26/13  Yes Johnny Koyanagi, MD    Family History  Family History  Problem Relation Age of Onset  . Other Neg Hx   . Heart disease Father     Social History  History   Social History  . Marital Status: Married    Spouse Name: N/A    Number of Children: N/A  . Years of Education: N/A   Occupational History  . Not on file.   Social History Main Topics  . Smoking status: Never Smoker   . Smokeless tobacco: Current User    Last Attempt to Quit: 09/06/2011  . Alcohol Use: Yes     Comment:  maybe 3 beers a month  . Drug Use: No  . Sexual Activity: Not on file   Other Topics Concern  . Not on file   Social History Narrative  . No narrative on file     Review of Systems, as per HPI, otherwise negative General:  No chills, fever, night sweats or weight changes.  Cardiovascular:  No chest pain, dyspnea on exertion, edema, orthopnea, palpitations, paroxysmal nocturnal dyspnea. Dermatological: No rash, lesions/masses Respiratory: No cough, dyspnea Urologic: No hematuria, dysuria Abdominal:   No nausea, vomiting, diarrhea, bright red blood per rectum, melena,  or hematemesis Neurologic:  No visual changes, wkns, changes in mental status. All other systems reviewed and are otherwise negative except as noted above.  Physical Exam  Blood pressure 190/96, pulse 66, height 5\' 9"  (1.753 m), weight 216 lb 6.4 oz (98.158 kg).  General: Pleasant, NAD Psych: Normal affect. Neuro: Alert and oriented X 3. Moves all extremities spontaneously. HEENT: Normal  Neck: Supple without bruits or JVD. Lungs:  Resp regular and unlabored, CTA. Heart: RRR no s3, s4, or murmurs. Abdomen: Soft, non-tender, non-distended, BS + x 4.  Extremities: No  clubbing, cyanosis or edema. DP/PT/Radials 2+ and equal bilaterally.  Labs:  No results found for this basename: CKTOTAL, CKMB, TROPONINI,  in the last 72 hours Lab Results  Component Value Date   WBC 5.6 08/05/2013   HGB 14.0* 08/05/2013   HCT 44.1 08/05/2013   MCV 101.2* 08/05/2013   PLT 222 01/03/2013   No results found for this basename: NA, K, CL, CO2, BUN, CREATININE, CALCIUM, LABALBU, PROT, BILITOT, ALKPHOS, ALT, AST, GLUCOSE,  in the last 168 hours Lab Results  Component Value Date   CHOL 173 08/05/2013   HDL 39* 08/05/2013   LDLCALC 115* 08/05/2013   TRIG 97 08/05/2013   Accessory Clinical Findings  Echocardiogram - none  ECG - sinus bradycardia 46 beats per minute negative T waves in inferior lead   Assessment & Plan  63 year old male   1. Chest pian - abnormal ECG and risk factors, we will order exercise nuclear stress test  2. Hypertension - the patient states this is unusual for him, we will recheck at stress test and response to exercise, no change to medication at this point as he previously experienced episodes of hypotension. However these might have been also associated with dehydration and these have been on hold summer day and he was working on the roof.  3. Hyperlipidemia - LDL 119 medical bill 100. He is advised about diet and seems to be there is a lot of room for improvement she enjoys eating fried food and he didn't regular basis.  Followup in 4 weeks.  Johnny Spark, MD, [facility] 08/22/2013, 3:24 PM

## 2013-09-21 NOTE — Progress Notes (Signed)
TR BAND REMOVAL  LOCATION:  right radial  DEFLATED PER PROTOCOL:  yes  TIME BAND OFF / DRESSING APPLIED:   1405   SITE UPON ARRIVAL:   Level 0  SITE AFTER BAND REMOVAL:  Level 0  REVERSE ALLEN'S TEST:    positive  CIRCULATION SENSATION AND MOVEMENT:  Within Normal Limits  yes  COMMENTS:

## 2013-09-21 NOTE — Care Management Note (Addendum)
  Page 2 of 2   09/21/2013     2:35:52 PM   CARE MANAGEMENT NOTE 09/21/2013  Patient:  Johnny Wilcox, Johnny Wilcox   Account Number:  [id number]  Date Initiated:  09/21/2013  Documentation initiated by:  Latasha Buczkowski  Subjective/Objective Assessment:   HTN, chest pain     Action/Plan:   CM to follow for disposition   Anticipated DC Date:  09/22/2013   Anticipated DC Plan:  [place]  CM consult  Medication Assistance      Choice offered to / List presented to:             Status of service:  In process, will continue to follow Medicare Important Message given?   (If response is "NO", the following Medicare IM given date fields will be blank) Date Medicare IM given:   Date Additional Medicare IM given:    Discharge Disposition:    Per UR Regulation:    If discussed at Long Length of Stay Meetings, dates discussed:    Comments:  BENEFITS CHECK: Contact:  COURTNEY @ [place] # [phone] BRILINTA 90-MG BID FOR 30 DAY SUPPLY COVER-YES CO-PAY-$ 60.00 TIER 3 [place] # [place] RN, BSN, [facility], CCM 09/21/2013  Benefits Check:  Brilinta 90mg  by mouth 2x/day Please check for coverage, co-pays, deductibles and authorizations. Thanks, [employer] RN, BSN, [employer], [place] 09/21/2013

## 2013-09-21 NOTE — Interval H&P Note (Signed)
History and Physical Interval Note:  09/21/2013 9:35 AM  Estil Daft  has presented today for surgery, with the diagnosis of abnormal mioview  The various methods of treatment have been discussed with the patient and family. After consideration of risks, benefits and other options for treatment, the patient has consented to  Procedure(s): LEFT HEART CATHETERIZATION WITH CORONARY ANGIOGRAM (N/A) as a surgical intervention .  The patient's history has been reviewed, patient examined, no change in status, stable for surgery.  I have reviewed the patient's chart and labs.  Questions were answered to the patient's satisfaction.   Cath Lab Visit (complete for each Cath Lab visit)  Clinical Evaluation Leading to the Procedure:   ACS: no  Non-ACS:    Anginal Classification: CCS IV  Anti-ischemic medical therapy: No Therapy  Non-Invasive Test Results: Low-risk stress test findings: cardiac mortality <1%/year  Prior CABG: No previous CABG        Cheria Sadiq M Martinique MD,FACC 09/21/2013 9:35 AM

## 2013-09-21 NOTE — CV Procedure (Signed)
    Cardiac Catheterization Procedure Note  Name: Johnny Wilcox MRN: [id number] DOB: 1951-05-16  Procedure: Left Heart Cath, Selective Coronary Angiography, LV angiography, PTCA and stenting of the mid RCA  Indication:62 yo WM with recent onset of angina class IV. Myoview showed an inferior wall perfusion abnormality.  Procedural Details:  The right wrist was prepped, draped, and anesthetized with 1% lidocaine. Using the modified Seldinger technique, a 6 French slender sheath was introduced into the right radial artery. 3 mg of verapamil was administered through the sheath, weight-based unfractionated heparin was administered intravenously. Standard Judkins catheters were used for selective coronary angiography and left ventriculography. Catheter exchanges were performed over an exchange length guidewire.  PROCEDURAL FINDINGS Hemodynamics: AO 108/57 mean 79 mm Hg LV 105/16 mm Hg   Coronary angiography: Coronary dominance: right  Left mainstem: Normal  Left anterior descending (LAD): There is smooth narrowing of the proximal LAD up to 30%. The first diagonal is a large branch and is normal.  Left circumflex (LCx): Normal.  Right coronary artery (RCA): Inferior takeoff. There is a 90% stenosis in the mid RCA at the site of takeoff of the RV marginal branch.  Left ventriculography: Left ventricular systolic function is normal, LVEF is estimated at 55-65%, there is no significant mitral regurgitation   PCI Note:  Following the diagnostic procedure, the decision was made to proceed with PCI.  Weight-based heparin was given for anticoagulation. Brilinta 180 mg was given orally. Once a therapeutic ACT was achieved, a 6 [place] MP A1 guide catheter was inserted.  A prowater coronary guidewire was used to cross the lesion.  The lesion was predilated with a 2.5 mm balloon.  The lesion was then stented with a 4.0 x 23 mm Alpine stent.  The stent was postdilated with a 4.0 noncompliant balloon.   Following PCI, there was 0% residual stenosis and TIMI-3 flow. Final angiography confirmed an excellent result. The patient tolerated the procedure well. There were no immediate procedural complications. A TR band was used for radial hemostasis. The patient was transferred to the post catheterization recovery area for further monitoring.  PCI Data: Vessel - RCA/Segment - mid Percent Stenosis (pre)  90% TIMI-flow 3 Stent 4.0 x 23 mm Alpine stent Percent Stenosis (post) 0% TIMI-flow (post) 3  Final Conclusions:   1. Single vessel obstructive CAD 2. Normal LV function  3. Successful stenting of the mid RCA with a DES.  Recommendations:  Continue dual antiplatelet therapy for one year. Start statin for risk factor modification. Anticipate DC in am.  Ander Slade [facility] 09/21/2013, 10:33 AM

## 2013-09-22 DIAGNOSIS — I209 Angina pectoris, unspecified: Secondary | ICD-10-CM

## 2013-09-22 DIAGNOSIS — E785 Hyperlipidemia, unspecified: Secondary | ICD-10-CM

## 2013-09-22 DIAGNOSIS — R079 Chest pain, unspecified: Secondary | ICD-10-CM

## 2013-09-22 DIAGNOSIS — I10 Essential (primary) hypertension: Secondary | ICD-10-CM

## 2013-09-22 LAB — CBC
HEMATOCRIT: 36.1 % — AB (ref 39.0–52.0)
HEMOGLOBIN: 12.4 g/dL — AB (ref 13.0–17.0)
MCH: 32.4 pg (ref 26.0–34.0)
MCHC: 34.3 g/dL (ref 30.0–36.0)
MCV: 94.3 fL (ref 78.0–100.0)
Platelets: 192 10*3/uL (ref 150–400)
RBC: 3.83 MIL/uL — AB (ref 4.22–5.81)
RDW: 12.9 % (ref 11.5–15.5)
WBC: 7.5 10*3/uL (ref 4.0–10.5)

## 2013-09-22 LAB — BASIC METABOLIC PANEL
BUN: 13 mg/dL (ref 6–23)
CALCIUM: 8.7 mg/dL (ref 8.4–10.5)
CO2: 23 meq/L (ref 19–32)
Chloride: 106 mEq/L (ref 96–112)
Creatinine, Ser: 0.9 mg/dL (ref 0.50–1.35)
GFR calc Af Amer: >90 mL/min (ref >90)
GFR calc non Af Amer: 89 mL/min — ABNORMAL LOW (ref >90)
GLUCOSE: 107 mg/dL — AB (ref 70–99)
Potassium: 4 mEq/L (ref 3.7–5.3)
Sodium: 142 mEq/L (ref 137–147)

## 2013-09-22 MED ORDER — ATORVASTATIN CALCIUM 80 MG PO TABS: 80.0000 mg | ORAL_TABLET | Freq: Every day | ORAL | Status: DC

## 2013-09-22 MED ORDER — TICAGRELOR 90 MG PO TABS: 90.0000 mg | ORAL_TABLET | Freq: Two times a day (BID) | ORAL | Status: DC

## 2013-09-22 MED ORDER — ASPIRIN 81 MG PO CHEW: 81.0000 mg | CHEWABLE_TABLET | Freq: Every day | ORAL | Status: DC

## 2013-09-22 NOTE — Progress Notes (Signed)
Subjective: No complaints.  Standing up in the room when I entered  Objective: Vital signs in last 24 hours: Temp:  [97.8 F (36.6 C)-98.6 F (37 C)] 98.5 F (36.9 C) (04/11 0449) Pulse Rate:  [38-56] 42 (04/11 0449) Resp:  [17-18] 18 (04/11 0449) BP: (104-141)/(55-80) 104/60 mmHg (04/11 0449) SpO2:  [97 %-99 %] 98 % (04/11 0449) Weight:  [206 lb (93.441 kg)-207 lb 10.8 oz (94.2 kg)] 207 lb 10.8 oz (94.2 kg) (04/11 0013) Last BM Date: 09/21/13  Intake/Output from previous day: 04/10 0701 - 04/11 0700 In: 967.1 [P.O.:360; I.V.:607.1] Out: 925 [Urine:925] Intake/Output this shift:    Medications Current Facility-Administered Medications  Medication Dose Route Frequency Provider Last Rate Last Dose  . acetaminophen (TYLENOL) tablet 650 mg  650 mg Oral Q4H PRN Rogelia Mire, NP   650 mg at 09/21/13 1455  . aspirin chewable tablet 81 mg  81 mg Oral Daily [name], MD      . atorvastatin (LIPITOR) tablet 80 mg  80 mg Oral q1800 [name], MD   80 mg at 09/21/13 1830  . lisinopril (PRINIVIL,ZESTRIL) tablet 10 mg  10 mg Oral Daily [name], MD   10 mg at 09/21/13 1828  . Ticagrelor (BRILINTA) tablet 90 mg  90 mg Oral BID [name], MD   90 mg at 09/21/13 2158    PE: General appearance: alert, cooperative and no distress Lungs: clear to auscultation bilaterally Heart: regular rate and rhythm, S1, S2 normal, no murmur, click, rub or gallop Extremities: No LEE Pulses: 2+ and symmetric Skin: Warm and dry.  Right wrist cath site appears normal-no ecchymosis Neurologic: Grossly normal  Lab Results:   Recent Labs  09/19/13 0919 09/22/13 0440  WBC 5.0 7.5  HGB 12.9* 12.4*  HCT 37.8* 36.1*  PLT 219.0 192   BMET  Recent Labs  09/19/13 0919 09/22/13 0440  NA 137 142  K 3.8 4.0  CL 103 106  CO2 27 23  GLUCOSE 104* 107*  BUN 15 13  CREATININE 1.0 0.90  CALCIUM 9.1 8.7   PT/INR  Recent Labs  09/19/13 0919  LABPROT 11.3  INR 1.1*    Cholesterol No results found for this basename: CHOL,  in the last 72 hours Cardiac Enzymes No components found with this basename: TROPONIN,  CKMB,   Studies/Results: Left heart cath  [facility] FINDINGS  Hemodynamics:  AO 108/57 mean 79 mm Hg  LV 105/16 mm Hg  Coronary angiography:  Coronary dominance: right  Left mainstem: Normal  Left anterior descending (LAD): There is smooth narrowing of the proximal LAD up to 30%. The first diagonal is a large branch and is normal.  Left circumflex (LCx): Normal.  Right coronary artery (RCA): Inferior takeoff. There is a 90% stenosis in the mid RCA at the site of takeoff of the RV marginal branch.  Left ventriculography: Left ventricular systolic function is normal, LVEF is estimated at 55-65%, there is no significant mitral regurgitation  PCI Note: Following the diagnostic procedure, the decision was made to proceed with PCI. Weight-based heparin was given for anticoagulation. Brilinta 180 mg was given orally. Once a therapeutic ACT was achieved, a 6 [place] MP A1 guide catheter was inserted. A prowater coronary guidewire was used to cross the lesion. The lesion was predilated with a 2.5 mm balloon. The lesion was then stented with a 4.0 x 23 mm Alpine stent. The stent was postdilated with a 4.0 noncompliant balloon. Following PCI,  there was 0% residual stenosis and TIMI-3 flow. Final angiography confirmed an excellent result. The patient tolerated the procedure well. There were no immediate procedural complications. A TR band was used for radial hemostasis. The patient was transferred to the post catheterization recovery area for further monitoring.  PCI Data:  Vessel - RCA/Segment - mid  Percent Stenosis (pre) 90%  TIMI-flow 3  Stent 4.0 x 23 mm Alpine stent  Percent Stenosis (post) 0%  TIMI-flow (post) 3  Final Conclusions:  1. Single vessel obstructive CAD  2. Normal LV function  3. Successful stenting of the mid RCA with a DES.   Recommendations:  Continue dual antiplatelet therapy for one year. Start statin for risk factor modification. Anticipate DC in am.  Ander Slade [facility]  09/21/2013, 10:33 AM    Assessment/Plan Johnny Wilcox is a 63 y.o. male with prior medical history of hypertension and erectile dysfunction who saw Dr. Meda Coffee on March 11 with concerns of chest pain.  He underwent a nuclear study on  April 8 which was low risk but with 1-50m ST depression inferolaterally and mild CP.  He continued to have symptoms and subsequently was brought in for a left heart cath.  Active Problems:   Angina pectoris    SP left heart cath revealing single vessel CAD.  A DES was placed in the mid RCA.  On ASA/Brilinta   HTN  BP stable.  Lisinopril 10   HLD  Continue statin   Dispostion  DC home.  Folow up with Dr. NMeda Coffee   LOS: 1 day    BTarri FullerPA-C 09/22/2013 7:09 AM   The patient was seen, examined and discussed with BTarri Fuller PA-C and I agree with the above.   Mr HHochstatterunderwent a left heart cath for abnormal stress test performed for chest pain. He received PCI/DES to RCA and will require dual antiplatelet therapy for at least two years. Continue lisinopril, atorvastatin, cant add BB as bradycardic at baseline.   Follow up in my clinic within three weeks with C-MET.   KDorothy Spark4/04/2014

## 2013-09-22 NOTE — Progress Notes (Signed)
CARDIAC REHAB PHASE I   PRE:  Rate/Rhythm: 56 sinus brady  BP:  Sitting: 164/79     SaO2: 97% RA  MODE:  Ambulation: 500 ft   POST:  Rate/Rhythm: 72 SR  BP:  Sitting: 184/76 recheck 156/95     SaO2: 99% RA 8:25AM-9:30AM Patient tolerated walk well.  No complaints of chest pain or tightness.  Patient states that he is interested in cardiac rehab but will more than likely not be able to attend due to his work schedule.  Patient was educated with wife in room.   Vita Erm, MS 09/22/2013 9:17 AM

## 2013-09-22 NOTE — Discharge Summary (Signed)
. Physician Discharge Summary     Patient ID: Johnny Wilcox MRN: [id number] DOB/AGE: 02-01-1951 63 y.o.  Admit date: 09/21/2013 Discharge date: 09/22/2013  Admission Diagnoses:  Angina  Discharge Diagnoses:  Active Problems:   Angina pectoris    CAD    HTN    HLD     Discharged Condition: stable  Wilcox Course:   Johnny Wilcox is a 64 y.o. male with prior medical history of hypertension and erectile dysfunction who saw Dr. Meda Coffee on March 11 with concerns of chest pain. He underwent a nuclear study on April 8 which was low risk but with 1-82mm ST depression inferolaterally and mild CP. He continued to have symptoms and subsequently was brought in for a left heart cath.  He presented of OP cath which revealed single vessel CAD. A DES was placed in the mid RCA. On ASA/Brilinta. Can't add BB as bradycardic at baseline.  The patient was seen by Dr. Meda Coffee who felt he was stable for DC home.     Consults: None  Significant Diagnostic Studies:  Left heart cath  [facility] FINDINGS  Hemodynamics:  AO 108/57 mean 79 mm Hg  LV 105/16 mm Hg  Coronary angiography:  Coronary dominance: right  Left mainstem: Normal  Left anterior descending (LAD): There is smooth narrowing of the proximal LAD up to 30%. The first diagonal is a large branch and is normal.  Left circumflex (LCx): Normal.  Right coronary artery (RCA): Inferior takeoff. There is a 90% stenosis in the mid RCA at the site of takeoff of the RV marginal branch.  Left ventriculography: Left ventricular systolic function is normal, LVEF is estimated at 55-65%, there is no significant mitral regurgitation  PCI Note: Following the diagnostic procedure, the decision was made to proceed with PCI. Weight-based heparin was given for anticoagulation. Brilinta 180 mg was given orally. Once a therapeutic ACT was achieved, a 6 [place] MP A1 guide catheter was inserted. A prowater coronary guidewire was used to cross the lesion. The lesion  was predilated with a 2.5 mm balloon. The lesion was then stented with a 4.0 x 23 mm Alpine stent. The stent was postdilated with a 4.0 noncompliant balloon. Following PCI, there was 0% residual stenosis and TIMI-3 flow. Final angiography confirmed an excellent result. The patient tolerated the procedure well. There were no immediate procedural complications. A TR band was used for radial hemostasis. The patient was transferred to the post catheterization recovery area for further monitoring.  PCI Data:  Vessel - RCA/Segment - mid  Percent Stenosis (pre) 90%  TIMI-flow 3  Stent 4.0 x 23 mm Alpine stent  Percent Stenosis (post) 0%  TIMI-flow (post) 3  Final Conclusions:  1. Single vessel obstructive CAD  2. Normal LV function  3. Successful stenting of the mid RCA with a DES.  Recommendations:  Continue dual antiplatelet therapy for one year. Start statin for risk factor modification. Anticipate DC in am.  Johnny Wilcox  09/21/2013, 10:33 AM  Treatments: See above  Discharge Exam: Blood pressure 184/76, pulse 51, temperature 98 F (36.7 C), temperature source Oral, resp. rate 18, height 5\' 11"  (1.803 m), weight 207 lb 10.8 oz (94.2 kg), SpO2 98.00%.   Disposition: 01-Home or Self Care  Discharge Orders   Future Appointments Provider Department Dept Phone   09/28/2013 4:30 PM Dorothy Spark, MD [facility] [phone]   Future Orders Complete By Expires   Diet - low sodium heart healthy  As  directed    Increase activity slowly  As directed        Medication List    TAKE these medications       aspirin 81 MG chewable tablet  Chew 1 tablet (81 mg total) by mouth daily.     atorvastatin 80 MG tablet  Commonly known as:  LIPITOR  Take 1 tablet (80 mg total) by mouth daily at 6 PM.     lisinopril 10 MG tablet  Commonly known as:  PRINIVIL,ZESTRIL  Take 1 tablet (10 mg total) by mouth daily.     Ticagrelor 90 MG Tabs tablet  Commonly known as:   BRILINTA  Take 1 tablet (90 mg total) by mouth 2 (two) times daily.      ASK your doctor about these medications       tadalafil 20 MG tablet  Commonly known as:  CIALIS  Take 1 tablet (20 mg total) by mouth daily as needed for erectile dysfunction. 3 month supply           Follow-up Information   Follow up with Dorothy Spark, MD. (The office will call you with the followup appt date and time.)    Specialty:  Cardiology   Contact information:   [address] [place] [address] [phone]       Signed: Tarri Fuller 09/22/2013, 8:54 AM  Dorothy Spark 09/22/2013

## 2013-09-25 ENCOUNTER — Telehealth: Payer: Self-pay | Admitting: *Deleted

## 2013-09-25 NOTE — Telephone Encounter (Signed)
PA sent to [facility] for patients brilinta, provided brilinta samples 4 bottles.

## 2013-09-27 NOTE — Telephone Encounter (Signed)
Per [employer] regarding BRILINTA case # [id number], "prior authorization is not required at this time".

## 2013-09-28 ENCOUNTER — Ambulatory Visit: Payer: 59 | Admitting: Cardiology

## 2013-10-02 ENCOUNTER — Encounter: Payer: Self-pay | Admitting: *Deleted

## 2013-10-04 ENCOUNTER — Encounter: Payer: Self-pay | Admitting: Cardiology

## 2013-10-04 ENCOUNTER — Ambulatory Visit (INDEPENDENT_AMBULATORY_CARE_PROVIDER_SITE_OTHER): Payer: 59 | Admitting: Cardiology

## 2013-10-04 VITALS — BP 154/78 | HR 50 | Ht 71.0 in | Wt 221.0 lb

## 2013-10-04 DIAGNOSIS — R079 Chest pain, unspecified: Secondary | ICD-10-CM

## 2013-10-04 DIAGNOSIS — I2510 Atherosclerotic heart disease of native coronary artery without angina pectoris: Secondary | ICD-10-CM

## 2013-10-04 DIAGNOSIS — I10 Essential (primary) hypertension: Secondary | ICD-10-CM

## 2013-10-04 DIAGNOSIS — E785 Hyperlipidemia, unspecified: Secondary | ICD-10-CM

## 2013-10-04 DIAGNOSIS — I209 Angina pectoris, unspecified: Secondary | ICD-10-CM

## 2013-10-04 LAB — CBC WITH DIFFERENTIAL/PLATELET
Basophils Absolute: 0 10*3/uL (ref 0.0–0.1)
Basophils Relative: 0.4 % (ref 0.0–3.0)
Eosinophils Absolute: 0.1 10*3/uL (ref 0.0–0.7)
Eosinophils Relative: 2.2 % (ref 0.0–5.0)
HCT: 38.1 % — ABNORMAL LOW (ref 39.0–52.0)
Hemoglobin: 13.1 g/dL (ref 13.0–17.0)
Lymphocytes Relative: 16.2 % (ref 12.0–46.0)
Lymphs Abs: 1.1 10*3/uL (ref 0.7–4.0)
MCHC: 34.5 g/dL (ref 30.0–36.0)
MCV: 94.1 fl (ref 78.0–100.0)
Monocytes Absolute: 0.5 10*3/uL (ref 0.1–1.0)
Monocytes Relative: 8.2 % (ref 3.0–12.0)
Neutro Abs: 4.8 10*3/uL (ref 1.4–7.7)
Neutrophils Relative %: 73 % (ref 43.0–77.0)
Platelets: 226 10*3/uL (ref 150.0–400.0)
RBC: 4.05 Mil/uL — ABNORMAL LOW (ref 4.22–5.81)
RDW: 13.5 % (ref 11.5–14.6)
WBC: 6.5 10*3/uL (ref 4.5–10.5)

## 2013-10-04 LAB — COMPREHENSIVE METABOLIC PANEL
ALT: 21 U/L (ref 0–53)
AST: 22 U/L (ref 0–37)
Albumin: 4.2 g/dL (ref 3.5–5.2)
Alkaline Phosphatase: 72 U/L (ref 39–117)
BUN: 10 mg/dL (ref 6–23)
CO2: 26 mEq/L (ref 19–32)
Calcium: 9.1 mg/dL (ref 8.4–10.5)
Chloride: 105 mEq/L (ref 96–112)
Creatinine, Ser: 0.9 mg/dL (ref 0.4–1.5)
GFR: 88.03 mL/min (ref >60.00)
Glucose, Bld: 105 mg/dL — ABNORMAL HIGH (ref 70–99)
Potassium: 3.8 mEq/L (ref 3.5–5.1)
Sodium: 139 mEq/L (ref 135–145)
Total Bilirubin: 0.6 mg/dL (ref 0.3–1.2)
Total Protein: 7.2 g/dL (ref 6.0–8.3)

## 2013-10-04 MED ORDER — LISINOPRIL 20 MG PO TABS: 20.0000 mg | ORAL_TABLET | Freq: Every day | ORAL | Status: DC

## 2013-10-04 NOTE — Progress Notes (Signed)
Patient ID: Johnny Wilcox, male   DOB: [birth year], 63 y.o.   MRN: [id number]    Patient Name: Johnny Wilcox Date of Encounter: 10/04/2013  Primary Care Provider:  Leandrew Koyanagi, MD Primary Cardiologist:  Dorothy Spark  Problem List   Past Medical History  Diagnosis Date  . Hypertension   . Medical history non-contributory    Past Surgical History  Procedure Laterality Date  . Eye surgery      child injury-repair.  Cataract Right  . Vasectomy    . Multiple extractions with alveoloplasty  12/06/2011    Procedure: MULTIPLE EXTRACION WITH ALVEOLOPLASTY;  Surgeon: [name], DDS;  Location: [facility];  Service: Oral Surgery;  Laterality: Bilateral;  Biopsy tissue right vestibule   Allergies  No Known Allergies  HPI  Johnny Wilcox is a 63 y.o. male with prior medical history of hypertension and erectile dysfunction was coming for concerns of chest pain. He originally presented to his primary care physician with concern of bilateral wrist pain that was constant result 2 weeks later. 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 At the same time he complained of chest pain that he experiences occasionally on exertion, at the same time he can work manually for multiple hours and on experience chest pain. He experiences chest pain frequently with sexual intercourse and describes his pain as retrosternal and sharp and can not be associated with any other symptoms such as shortness of breath or palpitations. He states that last summer his blood pressure was low and his blood pressure medication was cut in half because of symptoms of dizziness. He has significant family history of coronary artery disease, his father had 2 myocardial infarctions in his 24s and eventually died of heart attack at age of 18. His mom died of heart attack in her early 34s.  Nuclear stress test was positive, s/p cath --> PCI/DES to RCA, on dual antiplatelet therapy. He reports  resolution of CP and SOB, still some residual fatigue. Complains of easy bruising and prolonged bleeding (frequent injuries at work).  Home Medications  Prior to Admission medications   Medication Sig Start Date End Date Taking? Authorizing Provider  lisinopril (PRINIVIL,ZESTRIL) 10 MG tablet Take 1 tablet (10 mg total) by mouth daily. 01/05/13  Yes Leandrew Koyanagi, MD  tadalafil (CIALIS) 20 MG tablet Take 1 tablet (20 mg total) by mouth daily as needed for erectile dysfunction. 3 month supply 04/26/13  Yes Leandrew Koyanagi, MD    Family History  Family History  Problem Relation Age of Onset  . Heart disease Father     Social History  History   Social History  . Marital Status: Married    Spouse Name: N/A    Number of Children: N/A  . Years of Education: N/A   Occupational History  . Not on file.   Social History Main Topics  . Smoking status: Never Smoker   . Smokeless tobacco: Current User    Last Attempt to Quit: 09/06/2011  . Alcohol Use: Yes     Comment:  maybe 3 beers a month  . Drug Use: No  . Sexual Activity: Yes   Other Topics Concern  . Not on file   Social History Narrative  . No narrative on file     Review of Systems, as per HPI, otherwise negative General:  No chills, fever, night sweats or weight changes.  Cardiovascular:  No chest pain,  dyspnea on exertion, edema, orthopnea, palpitations, paroxysmal nocturnal dyspnea. Dermatological: No rash, lesions/masses Respiratory: No cough, dyspnea Urologic: No hematuria, dysuria Abdominal:   No nausea, vomiting, diarrhea, bright red blood per rectum, melena, or hematemesis Neurologic:  No visual changes, wkns, changes in mental status. All other systems reviewed and are otherwise negative except as noted above.  Physical Exam  Blood pressure 154/78, pulse 50, height 5\' 11"  (1.803 m), weight 221 lb (100.245 kg).  General: Pleasant, NAD Psych: Normal affect. Neuro: Alert and oriented X 3. Moves all  extremities spontaneously. HEENT: Normal  Neck: Supple without bruits or JVD. Lungs:  Resp regular and unlabored, CTA. Heart: RRR no s3, s4, or murmurs. Abdomen: Soft, non-tender, non-distended, BS + x 4.  Extremities: No clubbing, cyanosis or edema. DP/PT/Radials 2+ and equal bilaterally. Bruises on upper extremities  Labs:  No results found for this basename: CKTOTAL, CKMB, TROPONINI,  in the last 72 hours Lab Results  Component Value Date   WBC 7.5 09/22/2013   HGB 12.4* 09/22/2013   HCT 36.1* 09/22/2013   MCV 94.3 09/22/2013   PLT 192 09/22/2013   No results found for this basename: NA, K, CL, CO2, BUN, CREATININE, CALCIUM, LABALBU, PROT, BILITOT, ALKPHOS, ALT, AST, GLUCOSE,  in the last 168 hours Lab Results  Component Value Date   CHOL 173 08/05/2013   HDL 39* 08/05/2013   LDLCALC 115* 08/05/2013   TRIG 97 08/05/2013   Accessory Clinical Findings  Echocardiogram - none  ECG - sinus bradycardia 46 beats per minute negative T waves in inferior lead  Cardiac cath 09/21/13 PCI Data:  Vessel - RCA/Segment - mid  Percent Stenosis (pre) 90%  TIMI-flow 3  Stent 4.0 x 23 mm Alpine stent  Percent Stenosis (post) 0%  TIMI-flow (post) 3  Final Conclusions:  1. Single vessel obstructive CAD  2. Normal LV function  3. Successful stenting of the mid RCA with a DES.  Recommendations:  Continue dual antiplatelet therapy for one year. Start statin for risk factor modification. Anticipate DC in am.     Assessment & Plan  63 year old male   1. Chest pian - abnormal ECG and risk factors, positive stress test, s/p PCI to RCA on 09/21/13, on ASA and Brilinta.   2. Hypertension - uncontrolled, we will increase lisinopril to 20 mg po daily  3. Hyperlipidemia - LDL 119, on atorvastatin 80 mg po daily  Check CBC, CMP today.  Followup in 3 months  Dorothy Spark, MD, [facility] 10/04/2013, 9:26 AM

## 2013-10-04 NOTE — Patient Instructions (Addendum)
START TAKING LISINOPRIL 20 MG DAILY  Your physician recommends that you return for lab work in: TODAY (CMET AND CBC)  Your physician wants you to follow-up in: [place] will receive a reminder letter in the mail two months in advance. If you don't receive a letter, please call our office to schedule the follow-up appointment.

## 2013-10-12 ENCOUNTER — Other Ambulatory Visit: Payer: Self-pay | Admitting: *Deleted

## 2013-10-12 MED ORDER — ATORVASTATIN CALCIUM 80 MG PO TABS: 80.0000 mg | ORAL_TABLET | Freq: Every day | ORAL | Status: DC

## 2013-11-06 ENCOUNTER — Other Ambulatory Visit: Payer: Self-pay

## 2013-11-06 MED ORDER — TICAGRELOR 90 MG PO TABS: 90.0000 mg | ORAL_TABLET | Freq: Two times a day (BID) | ORAL | Status: DC

## 2013-11-06 NOTE — Telephone Encounter (Signed)
Rx was sent to pharmacy electronically. 

## 2013-11-23 DIAGNOSIS — Z0271 Encounter for disability determination: Secondary | ICD-10-CM

## 2013-12-27 ENCOUNTER — Encounter: Payer: Self-pay | Admitting: *Deleted

## 2013-12-28 ENCOUNTER — Telehealth: Payer: Self-pay | Admitting: Cardiology

## 2013-12-28 NOTE — Telephone Encounter (Signed)
Patient states he wants to let Dr. Meda Coffee know that he is having chest pain when he raises his arms or pulls up on something. He said it is a sharp pain and he thinks it may be a muscle injury however he did not want to be dismissive, "in case it's something more". Denies SOB, dizziness, flushing, other symptomology. Says he has been doing a lot of lifting at work recently too.  Has appointment with Dr. Meda Coffee on 01/07/14. Willing to come sooner if Dr. Meda Coffee wants him to move up his appointment. Discussed patient can also call his PCP to be assessed for muscle injury. Reviewed that he should call us back for worsening chest pain or if it happens at any other occasions. Verbalized agreement. Denies that he is having CP with other activities. Message routed to Dr. Meda Coffee.

## 2013-12-28 NOTE — Telephone Encounter (Signed)
No answer/no voicemail 7/17 @ 11:45 am/pde

## 2013-12-28 NOTE — Telephone Encounter (Signed)
°  Patient is having chest pain only when he raises his arms. NO SOB, please call and advise.

## 2013-12-31 NOTE — Telephone Encounter (Signed)
Called pt to check on him and see how he is doing.  Pt states he is feeling much better.  Pt seems to think its muscular pai.  Pt states he has been working everyday since he called on 7/17 for 12 hrs each day with no complications.  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  Encouraged pt to drink plenty of fluids (H2O and Gatorade) to supplement loss and prevent exhaustion.  Advised pt that if he starts having cp, sob, palpitations, LEE, DOE, dizziness, syncopal episodes, or becomes acutely distressed at all, then he should call 911 and further update our office.  Pt verbalized understanding and agrees with this plan. Pt very gracious for the check-in.  Informed pt of upcoming appt with Dr Meda Coffee on 7/27 at 0730.

## 2014-01-07 ENCOUNTER — Encounter: Payer: Self-pay | Admitting: Cardiology

## 2014-01-07 ENCOUNTER — Ambulatory Visit (INDEPENDENT_AMBULATORY_CARE_PROVIDER_SITE_OTHER): Payer: 59 | Admitting: Cardiology

## 2014-01-07 VITALS — BP 138/86 | HR 46 | Ht 69.5 in | Wt 223.1 lb

## 2014-01-07 DIAGNOSIS — I2510 Atherosclerotic heart disease of native coronary artery without angina pectoris: Secondary | ICD-10-CM

## 2014-01-07 DIAGNOSIS — E785 Hyperlipidemia, unspecified: Secondary | ICD-10-CM

## 2014-01-07 LAB — CBC WITH DIFFERENTIAL/PLATELET
Basophils Absolute: 0 10*3/uL (ref 0.0–0.1)
Basophils Relative: 0.5 % (ref 0.0–3.0)
Eosinophils Absolute: 0.2 10*3/uL (ref 0.0–0.7)
Eosinophils Relative: 2.4 % (ref 0.0–5.0)
HCT: 38.4 % — ABNORMAL LOW (ref 39.0–52.0)
Hemoglobin: 13.1 g/dL (ref 13.0–17.0)
Lymphocytes Relative: 21.1 % (ref 12.0–46.0)
Lymphs Abs: 1.4 10*3/uL (ref 0.7–4.0)
MCHC: 34 g/dL (ref 30.0–36.0)
MCV: 95.7 fl (ref 78.0–100.0)
Monocytes Absolute: 0.6 10*3/uL (ref 0.1–1.0)
Monocytes Relative: 9.3 % (ref 3.0–12.0)
Neutro Abs: 4.5 10*3/uL (ref 1.4–7.7)
Neutrophils Relative %: 66.7 % (ref 43.0–77.0)
Platelets: 202 10*3/uL (ref 150.0–400.0)
RBC: 4.01 Mil/uL — ABNORMAL LOW (ref 4.22–5.81)
RDW: 13.2 % (ref 11.5–15.5)
WBC: 6.7 10*3/uL (ref 4.0–10.5)

## 2014-01-07 LAB — COMPREHENSIVE METABOLIC PANEL
ALT: 25 U/L (ref 0–53)
AST: 22 U/L (ref 0–37)
Albumin: 4.2 g/dL (ref 3.5–5.2)
Alkaline Phosphatase: 61 U/L (ref 39–117)
BUN: 13 mg/dL (ref 6–23)
CO2: 27 mEq/L (ref 19–32)
Calcium: 9 mg/dL (ref 8.4–10.5)
Chloride: 105 mEq/L (ref 96–112)
Creatinine, Ser: 1.1 mg/dL (ref 0.4–1.5)
GFR: 75.51 mL/min (ref >60.00)
Glucose, Bld: 113 mg/dL — ABNORMAL HIGH (ref 70–99)
Potassium: 3.9 mEq/L (ref 3.5–5.1)
Sodium: 139 mEq/L (ref 135–145)
Total Bilirubin: 0.4 mg/dL (ref 0.2–1.2)
Total Protein: 7 g/dL (ref 6.0–8.3)

## 2014-01-07 NOTE — Progress Notes (Signed)
Patient ID: Johnny Wilcox, male   DOB: 01/30/1951, 63 y.o.   MRN: [id number]    Patient Name: Johnny Wilcox Date of Encounter: 01/07/2014  Primary Care Provider:  Leandrew Koyanagi, MD Primary Cardiologist:  Dorothy Spark  Problem List   Past Medical History  Diagnosis Date  . Hypertension   . Medical history non-contributory    Past Surgical History  Procedure Laterality Date  . Eye surgery      child injury-repair.    . Vasectomy    . Multiple extractions with alveoloplasty  12/06/2011    Procedure: MULTIPLE EXTRACION WITH ALVEOLOPLASTY;  Surgeon: [name], DDS;  Location: [facility];  Service: Oral Surgery;  Laterality: Bilateral;  Biopsy tissue right vestibule  . Cataract extraction Right    Allergies  No Known Allergies  HPI  Johnny Wilcox is a 63 y.o. male with prior medical history of hypertension and erectile dysfunction was coming for concerns of chest pain. He originally presented to his primary care physician with concern of bilateral wrist pain that was constant result 2 weeks later. 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 At the same time he complained of chest pain that he experiences occasionally on exertion, at the same time he can work manually for multiple hours and on experience chest pain. He experiences chest pain frequently with sexual intercourse and describes his pain as retrosternal and sharp and can not be associated with any other symptoms such as shortness of breath or palpitations. He states that last summer his blood pressure was low and his blood pressure medication was cut in half because of symptoms of dizziness. He has significant family history of coronary artery disease, his father had 2 myocardial infarctions in his 72s and eventually died of heart attack at age of 18. His mom died of heart attack in her early 65s.  Nuclear stress test was positive, s/p cath --> PCI/DES to RCA, on dual antiplatelet  therapy. He reports resolution of CP and SOB, still some residual fatigue. Complains of easy bruising and prolonged bleeding (frequent injuries at work).  Follow up after 3 months, he is asymptomatic other than pulled muscle about 10 days ago. He is working and is taking his medicines all the time. No CP, SOB. No muscle pain.   Home Medications  Prior to Admission medications   Medication Sig Start Date End Date Taking? Authorizing Provider  lisinopril (PRINIVIL,ZESTRIL) 10 MG tablet Take 1 tablet (10 mg total) by mouth daily. 01/05/13  Yes Leandrew Koyanagi, MD  tadalafil (CIALIS) 20 MG tablet Take 1 tablet (20 mg total) by mouth daily as needed for erectile dysfunction. 3 month supply 04/26/13  Yes Leandrew Koyanagi, MD    Family History  Family History  Problem Relation Age of Onset  . Heart disease Father     Social History  History   Social History  . Marital Status: Married    Spouse Name: N/A    Number of Children: N/A  . Years of Education: N/A   Occupational History  . Not on file.   Social History Main Topics  . Smoking status: Never Smoker   . Smokeless tobacco: Current User    Last Attempt to Quit: 09/06/2011  . Alcohol Use: Yes     Comment:  maybe 3 beers a month  . Drug Use: No  . Sexual Activity: Yes   Other Topics Concern  . Not on  file   Social History Narrative  . No narrative on file     Review of Systems, as per HPI, otherwise negative General:  No chills, fever, night sweats or weight changes.  Cardiovascular:  No chest pain, dyspnea on exertion, edema, orthopnea, palpitations, paroxysmal nocturnal dyspnea. Dermatological: No rash, lesions/masses Respiratory: No cough, dyspnea Urologic: No hematuria, dysuria Abdominal:   No nausea, vomiting, diarrhea, bright red blood per rectum, melena, or hematemesis Neurologic:  No visual changes, wkns, changes in mental status. All other systems reviewed and are otherwise negative except as noted  above.  Physical Exam  Blood pressure 138/86, pulse 46, height 5' 9.5" (1.765 m), weight 223 lb 1.9 oz (101.207 kg).  General: Pleasant, NAD Psych: Normal affect. Neuro: Alert and oriented X 3. Moves all extremities spontaneously. HEENT: Normal  Neck: Supple without bruits or JVD. Lungs:  Resp regular and unlabored, CTA. Heart: RRR no s3, s4, or murmurs. Abdomen: Soft, non-tender, non-distended, BS + x 4.  Extremities: No clubbing, cyanosis or edema. DP/PT/Radials 2+ and equal bilaterally. Bruises on upper extremities  Labs:  No results found for this basename: CKTOTAL, CKMB, TROPONINI,  in the last 72 hours Lab Results  Component Value Date   WBC 6.5 10/04/2013   HGB 13.1 10/04/2013   HCT 38.1* 10/04/2013   MCV 94.1 10/04/2013   PLT 226.0 10/04/2013   No results found for this basename: NA, K, CL, CO2, BUN, CREATININE, CALCIUM, LABALBU, PROT, BILITOT, ALKPHOS, ALT, AST, GLUCOSE,  in the last 168 hours Lab Results  Component Value Date   CHOL 173 08/05/2013   HDL 39* 08/05/2013   LDLCALC 115* 08/05/2013   TRIG 97 08/05/2013   Accessory Clinical Findings  Echocardiogram - none  ECG - sinus bradycardia 46 beats per minute negative T waves in inferior lead  Cardiac cath 09/21/13 PCI Data:  Vessel - RCA/Segment - mid  Percent Stenosis (pre) 90%  TIMI-flow 3  Stent 4.0 x 23 mm Alpine stent  Percent Stenosis (post) 0%  TIMI-flow (post) 3  Final Conclusions:  1. Single vessel obstructive CAD  2. Normal LV function  3. Successful stenting of the mid RCA with a DES.  Recommendations:  Continue dual antiplatelet therapy for one year. Start statin for risk factor modification. Anticipate DC in am.     Assessment & Plan  63 year old male   1. Chest pain - abnormal ECG and risk factors, positive stress test, s/p PCI to RCA on 09/21/13, on ASA and Brilinta.   2. Hypertension - controlled on lisinopril to 20 mg po daily  3. Hyperlipidemia - LDL 119, on atorvastatin 80 mg po  daily  Check CBC, CMP, HbA1c today.  Followup in 1 year with lipids.  Dorothy Spark, MD, [facility] 01/07/2014, 7:51 AM

## 2014-01-07 NOTE — Patient Instructions (Signed)
**Note De-Identified Sekou Zuckerman Obfuscation** Your physician recommends that you continue on your current medications as directed. Please refer to the Current Medication list given to you today.  Your physician recommends that you return for lab work in: today and just before your next office visit in 1 year  Your physician wants you to follow-up in: 1 year. You will receive a reminder letter in the mail two months in advance. If you don't receive a letter, please call our office to schedule the follow-up appointment.

## 2014-03-20 ENCOUNTER — Encounter: Payer: Self-pay | Admitting: Internal Medicine

## 2014-03-20 ENCOUNTER — Ambulatory Visit (INDEPENDENT_AMBULATORY_CARE_PROVIDER_SITE_OTHER): Payer: 59 | Admitting: Internal Medicine

## 2014-03-20 VITALS — BP 127/72 | HR 42 | Temp 98.0°F | Resp 16 | Ht 69.0 in | Wt 222.0 lb

## 2014-03-20 DIAGNOSIS — Z1211 Encounter for screening for malignant neoplasm of colon: Secondary | ICD-10-CM

## 2014-03-20 DIAGNOSIS — Z0000 Encounter for general adult medical examination without abnormal findings: Secondary | ICD-10-CM

## 2014-03-20 DIAGNOSIS — Z98811 Dental restoration status: Secondary | ICD-10-CM

## 2014-03-20 DIAGNOSIS — R739 Hyperglycemia, unspecified: Secondary | ICD-10-CM

## 2014-03-20 DIAGNOSIS — H9190 Unspecified hearing loss, unspecified ear: Secondary | ICD-10-CM | POA: Insufficient documentation

## 2014-03-20 DIAGNOSIS — G5601 Carpal tunnel syndrome, right upper limb: Secondary | ICD-10-CM

## 2014-03-20 DIAGNOSIS — K08109 Complete loss of teeth, unspecified cause, unspecified class: Secondary | ICD-10-CM | POA: Insufficient documentation

## 2014-03-20 DIAGNOSIS — I10 Essential (primary) hypertension: Secondary | ICD-10-CM

## 2014-03-20 DIAGNOSIS — Z23 Encounter for immunization: Secondary | ICD-10-CM

## 2014-03-20 DIAGNOSIS — G5600 Carpal tunnel syndrome, unspecified upper limb: Secondary | ICD-10-CM | POA: Insufficient documentation

## 2014-03-20 DIAGNOSIS — Z125 Encounter for screening for malignant neoplasm of prostate: Secondary | ICD-10-CM

## 2014-03-20 DIAGNOSIS — E785 Hyperlipidemia, unspecified: Secondary | ICD-10-CM

## 2014-03-20 DIAGNOSIS — G5603 Carpal tunnel syndrome, bilateral upper limbs: Secondary | ICD-10-CM

## 2014-03-20 DIAGNOSIS — G5602 Carpal tunnel syndrome, left upper limb: Secondary | ICD-10-CM

## 2014-03-20 DIAGNOSIS — Z972 Presence of dental prosthetic device (complete) (partial): Secondary | ICD-10-CM

## 2014-03-20 DIAGNOSIS — H9193 Unspecified hearing loss, bilateral: Secondary | ICD-10-CM

## 2014-03-20 LAB — POCT GLYCOSYLATED HEMOGLOBIN (HGB A1C): Hemoglobin A1C: 5.5

## 2014-03-20 MED ORDER — TADALAFIL 20 MG PO TABS: 20.0000 mg | ORAL_TABLET | Freq: Every day | ORAL | Status: DC | PRN

## 2014-03-20 NOTE — Patient Instructions (Addendum)
Influenza Vaccine (Flu Vaccine, Inactivated or Recombinant) 2014-2015: What You Need to Know 1. Why get vaccinated? Influenza ("flu") is a contagious disease that spreads around the [place] every winter, usually between October and May. Flu is caused by influenza viruses, and is spread mainly by coughing, sneezing, and close contact. Anyone can get flu, but the risk of getting flu is highest among children. Symptoms come on suddenly and may last several days. They can include:  fever/chills  sore throat  muscle aches  fatigue  cough  headache  runny or stuffy nose Flu can make some people much sicker than others. These people include young children, people 65 and older, pregnant women, and people with certain health conditions-such as heart, lung or kidney disease, nervous system disorders, or a weakened immune system. Flu vaccination is especially important for these people, and anyone in close contact with them. Flu can also lead to pneumonia, and make existing medical conditions worse. It can cause diarrhea and seizures in children. Each year thousands of people in the [place] die from flu, and many more are hospitalized. Flu vaccine is the best protection against flu and its complications. Flu vaccine also helps prevent spreading flu from person to person. 2. Inactivated and recombinant flu vaccines You are getting an injectable flu vaccine, which is either an "inactivated" or "recombinant" vaccine. These vaccines do not contain any live influenza virus. They are given by injection with a needle, and often called the "flu shot."  A different live, attenuated (weakened) influenza vaccine is sprayed into the nostrils. This vaccine is described in a separate Vaccine Information Statement. Flu vaccination is recommended every year. Some children 6 months through 8 years of age might need two doses during one year. Flu viruses are always changing. Each year's flu vaccine is made  to protect against 3 or 4 viruses that are likely to cause disease that year. Flu vaccine cannot prevent all cases of flu, but it is the best defense against the disease.  It takes about 2 weeks for protection to develop after the vaccination, and protection lasts several months to a year. Some illnesses that are not caused by influenza virus are often mistaken for flu. Flu vaccine will not prevent these illnesses. It can only prevent influenza. Some inactivated flu vaccine contains a very small amount of a mercury-based preservative called thimerosal. Studies have shown that thimerosal in vaccines is not harmful, but flu vaccines that do not contain a preservative are available. 3. Some people should not get this vaccine Tell the person who gives you the vaccine:  If you have any severe, life-threatening allergies. If you ever had a life-threatening allergic reaction after a dose of flu vaccine, or have a severe allergy to any part of this vaccine, including (for example) an allergy to gelatin, antibiotics, or eggs, you may be advised not to get vaccinated. Most, but not all, types of flu vaccine contain a small amount of egg protein.  If you ever had Guillain-Barr Syndrome (a severe paralyzing illness, also called GBS). Some people with a history of GBS should not get this vaccine. This should be discussed with your doctor.  If you are not feeling well. It is usually okay to get flu vaccine when you have a mild illness, but you might be advised to wait until you feel better. You should come back when you are better. 4. Risks of a vaccine reaction With a vaccine, like any medicine, there is a chance of side   effects. These are usually mild and go away on their own. Problems that could happen after any vaccine:  Brief fainting spells can happen after any medical procedure, including vaccination. Sitting or lying down for about 15 minutes can help prevent fainting, and injuries caused by a fall. Tell  your doctor if you feel dizzy, or have vision changes or ringing in the ears.  Severe shoulder pain and reduced range of motion in the arm where a shot was given can happen, very rarely, after a vaccination.  Severe allergic reactions from a vaccine are very rare, estimated at less than 1 in a million doses. If one were to occur, it would usually be within a few minutes to a few hours after the vaccination. Mild problems following inactivated flu vaccine:  soreness, redness, or swelling where the shot was given  hoarseness  sore, red or itchy eyes  cough  fever  aches  headache  itching  fatigue If these problems occur, they usually begin soon after the shot and last 1 or 2 days. Moderate problems following inactivated flu vaccine:  Young children who get inactivated flu vaccine and pneumococcal vaccine (PCV13) at the same time may be at increased risk for seizures caused by fever. Ask your doctor for more information. Tell your doctor if a child who is getting flu vaccine has ever had a seizure. Inactivated flu vaccine does not contain live flu virus, so you cannot get the flu from this vaccine. As with any medicine, there is a very remote chance of a vaccine causing a serious injury or death. The safety of vaccines is always being monitored. For more information, visit: www.cdc.gov/vaccinesafety/ 5. What if there is a serious reaction? What should I look for?  Look for anything that concerns you, such as signs of a severe allergic reaction, very high fever, or behavior changes. Signs of a severe allergic reaction can include hives, swelling of the face and throat, difficulty breathing, a fast heartbeat, dizziness, and weakness. These would start a few minutes to a few hours after the vaccination. What should I do?  If you think it is a severe allergic reaction or other emergency that can't wait, call 9-1-1 and get the person to the nearest hospital. Otherwise, call your  doctor.  Afterward, the reaction should be reported to the Vaccine Adverse Event Reporting System (VAERS). Your doctor should file this report, or you can do it yourself through the VAERS web site at www.vaers.hhs.gov, or by calling 1-800-822-7967. VAERS does not give medical advice. 6. The National Vaccine Injury Compensation Program The National Vaccine Injury Compensation Program (VICP) is a federal program that was created to compensate people who may have been injured by certain vaccines. Persons who believe they may have been injured by a vaccine can learn about the program and about filing a claim by calling 1-800-338-2382 or visiting the VICP website at www.hrsa.gov/vaccinecompensation. There is a time limit to file a claim for compensation. 7. How can I learn more?  Ask your health care provider.  Call your local or state health department.  Contact the Centers for Disease Control and Prevention (CDC):  Call 1-800-232-4636 (1-800-CDC-INFO) or  Visit CDC's website at www.cdc.gov/flu CDC Vaccine Information Statement (Interim) Inactivated Influenza Vaccine (01/30/2013) Document Released: 03/25/2006 Document Revised: 10/15/2013 Document Reviewed: 05/18/2013 [employer] Patient Information 2015 [employer]. This information is not intended to replace advice given to you by your health care provider. Make sure you discuss any questions you have with your health   care provider. Pneumococcal Conjugate Vaccine: What You Need to Know Your doctor recommends that you, or your child, get a dose of PCV13 today. 1. Why get vaccinated? Pneumococcal conjugate vaccine (called PCV13 or Prevnar 13) is recommended to protect infants and toddlers, and some older children and adults with certain health conditions, from pneumococcal disease. Pneumococcal disease is caused by infection with Streptococcus pneumoniae bacteria. These bacteria can spread from person to person through close  contact. Pneumococcal disease can lead to severe health problems, including pneumonia, blood infections, and meningitis. Meningitis is an infection of the covering of the brain. Pneumococcal meningitis is fairly rare (less than 1 case per 100,000 people each year), but it leads to other health problems, including deafness and brain damage. In children, it is fatal in about 1 case out of 10. Children younger than two are at higher risk for serious disease than older children. People with certain medical conditions, people over age 2, and cigarette smokers are also at higher risk. Before vaccine, pneumococcal infections caused many problems each year in the [place] in children younger than 5, including:  more than 700 cases of meningitis,  13,000 blood infections,  about 5 million ear infections, and  about 200 deaths. About 4,000 adults still die each year because of pneumococcal infections. Pneumococcal infections can be hard to treat because some strains are resistant to antibiotics. This makes prevention through vaccination even more important. 2. PCV13 vaccine There are more than 90 types of pneumococcal bacteria. PCV13 protects against 13 of them. These 13 strains cause most severe infections in children and about half of infections in adults.  PCV13 is routinely given to children at 2, 4, 6, and 26-35 months of age. Children in this age range are at greatest risk for serious diseases caused by pneumococcal infection. PCV13 vaccine may also be recommended for some older children or adults. Your doctor can give you details. A second type of pneumococcal vaccine, called PPSV23, may also be given to some children and adults, including anyone over age 79. There is a separate Vaccine Information Statement for this vaccine. 3. Precautions  Anyone who has ever had a life-threatening allergic reaction to a dose of this vaccine, to an earlier pneumococcal vaccine called PCV7 (or Prevnar), or  to any vaccine containing diphtheria toxoid (for example, DTaP), should not get PCV13. Anyone with a severe allergy to any component of PCV13 should not get the vaccine. Tell your doctor if the person being vaccinated has any severe allergies. If the person scheduled for vaccination is sick, your doctor might decide to reschedule the shot on another day. Your doctor can give you more information about any of these precautions. 4. What are the risks of PCV13 vaccine?  With any medicine, including vaccines, there is a chance of side effects. These are usually mild and go away on their own, but serious reactions are also possible. Reported problems associated with PCV13 vary by dose and age, but generally:  About half of children became drowsy after the shot, had a temporary loss of appetite, or had redness or tenderness where the shot was given.  About 1 out of 3 had swelling where the shot was given.  About 1 out of 3 had a mild fever, and about 1 in 20 had a higher fever (over 102.76F).  Up to about 8 out of 10 became fussy or irritable. Adults receiving the vaccine have reported redness, pain, and swelling where the shot was given. Mild fever, fatigue,  headache, chills, or muscle pain have also been reported. Life-threatening allergic reactions from any vaccine are very rare. 5. What if there is a serious reaction? What should I look for?  Look for anything that concerns you, such as signs of a severe allergic reaction, very high fever, or behavior changes. Signs of a severe allergic reaction can include hives, swelling of the face and throat, difficulty breathing, a fast heartbeat, dizziness, and weakness. These would start a few minutes to a few hours after the vaccination. What should I do?  If you think it is a severe allergic reaction or other emergency that can't wait, call 9-1-1 or get the person to the nearest hospital. Otherwise, call your doctor.  Afterward, the reaction should  be reported to the Vaccine Adverse Event Reporting System (VAERS). Your doctor might file this report, or you can do it yourself through the VAERS web site at www.vaers.SamedayNews.es, or by calling (571)451-3040. VAERS is only for reporting reactions. They do not give medical advice. 6. The National Vaccine Injury Compensation Program The [employer] Vaccine Injury Compensation Program (VICP) is a federal program that was created to compensate people who may have been injured by certain vaccines. Persons who believe they may have been injured by a vaccine can learn about the program and about filing a claim by calling 9381771628 or visiting the [place] website at GoldCloset.com.ee. 7. How can I learn more?  Ask your doctor.  Call your local or state health department.  Contact the Centers for Disease Control and Prevention (CDC):  Call (785)214-3971 (1-800-CDC-INFO) or  Visit CDC's website at http://hunter.com/ CDC PCV13 Vaccine VIS (Interim) (08/11/11) Document Released: 03/28/2006 Document Revised: 10/15/2013 Document Reviewed: 07/20/2013 [facility] Patient Information 2015 [place], [place]. This information is not intended to replace advice given to you by your health care provider. Make sure you discuss any questions you have with your health care provider.

## 2014-03-20 NOTE — Progress Notes (Signed)
Subjective:    Patient ID: Johnny Wilcox, male    DOB: 06-19-1950, 63 y.o.   MRN: [id number]  HPI Patient presents for follow up. Increased energy and not as tired when he gets off of work now that he has had stents placed. Never had headaches or bruised much until he started medication.  Is wearing splints at night for carpal tunnel. Improved.  Feels like he is losing his hearing for the past 6 months. Sound feels muffled.  Enjoys his job in [employer] and does not plan on retiring for at least a few years.  Health maintainence: Needs medication refills. Has had flu shot. Colonoscopy not up to date cause he kept postponing date.   Will get Pneumococcal vaccine13   Review of Systems  Constitutional: Negative for fever, chills, activity change, appetite change and fatigue.  HENT: Negative for congestion, ear discharge, ear pain, sinus pressure and sore throat.   Eyes: Negative for pain.  Respiratory: Negative for cough, chest tightness and shortness of breath.   Cardiovascular: Negative for chest pain, palpitations and leg swelling.  Gastrointestinal: Negative for nausea, vomiting, abdominal pain, diarrhea and constipation.  Genitourinary: Positive for frequency (at baseline). Negative for dysuria, urgency and decreased urine volume.  Musculoskeletal: Negative for back pain, joint swelling and myalgias.  Allergic/Immunologic: Negative for environmental allergies.  Neurological: Positive for headaches. Negative for dizziness, weakness and light-headedness.  Hematological: Bruises/bleeds easily.       Objective:   Physical Exam  Constitutional: He is oriented to person, place, and time. He appears well-developed and well-nourished. No distress.  HENT:  Head: Normocephalic and atraumatic.  Right Ear: Tympanic membrane, external ear and ear canal normal. No drainage, swelling or tenderness. Tympanic membrane is not perforated, not erythematous and not bulging. No middle ear  effusion.  Left Ear: External ear normal. No drainage, swelling or tenderness. A foreign body (cerumen impaction) is present.  Mouth/Throat: Oropharynx is clear and moist. He has dentures. No oropharyngeal exudate.  Eyes: Conjunctivae are normal. Right eye exhibits no discharge. Left eye exhibits no discharge. No scleral icterus.  Neck: Normal range of motion. Neck supple. No JVD present. No thyromegaly present.  Cardiovascular: Regular rhythm, normal heart sounds and intact distal pulses.  Exam reveals no gallop and no friction rub.   No murmur heard. Pulmonary/Chest: Effort normal and breath sounds normal. No stridor. No respiratory distress. He has no wheezes. He has no rales.  Abdominal: Soft. Bowel sounds are normal. He exhibits no distension and no mass.  Musculoskeletal: Normal range of motion. He exhibits no edema and no tenderness.  Lymphadenopathy:    He has no cervical adenopathy.  Neurological: He is alert and oriented to person, place, and time. He has normal reflexes.  Skin: Skin is warm and dry. No rash noted. He is not diaphoretic. No erythema. No pallor.  Psychiatric: He has a normal mood and affect. His behavior is normal. Judgment and thought content normal.   Blood pressure 127/72, pulse 42, temperature 98 F (36.7 C), resp. rate 16, height 5\' 9"  (1.753 m), weight 222 lb (100.699 kg), SpO2 98.00%. Hearing screen =mod loss higher freq, not in convers range     Assessment & Plan:  1. Routine general medical examination at a health care facility - Lipid panel - POCT glycosylated hemoglobin (Hb A1C) - PSA  2. Need for prophylactic vaccination and inoculation against influenza - Flu Vaccine QUAD 36+ mos IM  3. Essential hypertension Well Controlled. - Continue Zestril 20  mg. - Continue aspirin 81 mg.  4. Hyperlipidemia - Lipid panel -Continue Lipitor 80 mg.   5. Screening for prostate cancer - PSA  6. Need for prophylactic vaccination against Streptococcus  pneumoniae (pneumococcus) - Pneumococcal conjugate vaccine 13-valent IM  7. Cerumen Impaction - Irrigation performed. Resolved  8. Hearing Loss  Johnny Heimlich PA-C  [facility] 03/20/2014 11:42 AM  I have participated in the care of this patient with the Advanced Practice Provider and agree with Diagnosis and Plan as documented. Meds ordered this encounter  Medications  . tadalafil (CIALIS) 20 MG tablet    Sig: Take 1 tablet (20 mg total) by mouth daily as needed for erectile dysfunction. 3 month supply    Dispense:  15 tablet    Refill:  3    Order Specific Question:  Supervising Provider    Answer:  Wardell Honour [[id number]]  others utd per Cardiol after recent care  Robert P. Laney Pastor, M.D.  Addend Results for orders placed in visit on 03/20/14  LIPID PANEL      Result Value Ref Range   Cholesterol 79  0 - 200 mg/dL   Triglycerides 54  <150 mg/dL   HDL 41  >39 mg/dL   Total CHOL/HDL Ratio 1.9     VLDL 11  0 - 40 mg/dL   LDL Cholesterol 27  0 - 99 mg/dL  PSA      Result Value Ref Range   PSA 0.56  <=4.00 ng/mL  POCT GLYCOSYLATED HEMOGLOBIN (HGB A1C)      Result Value Ref Range   Hemoglobin A1C 5.5     !!!!

## 2014-03-21 LAB — LIPID PANEL
CHOL/HDL RATIO: 1.9 ratio
CHOLESTEROL: 79 mg/dL (ref 0–200)
HDL: 41 mg/dL (ref >39)
LDL Cholesterol: 27 mg/dL (ref 0–99)
TRIGLYCERIDES: 54 mg/dL (ref <150)
VLDL: 11 mg/dL (ref 0–40)

## 2014-03-21 LAB — PSA: PSA: 0.56 ng/mL (ref <=4.00)

## 2014-03-23 ENCOUNTER — Encounter: Payer: Self-pay | Admitting: Internal Medicine

## 2014-04-06 ENCOUNTER — Other Ambulatory Visit: Payer: Self-pay | Admitting: Internal Medicine

## 2014-04-08 ENCOUNTER — Other Ambulatory Visit: Payer: Self-pay

## 2014-04-08 DIAGNOSIS — I209 Angina pectoris, unspecified: Secondary | ICD-10-CM

## 2014-04-08 DIAGNOSIS — E785 Hyperlipidemia, unspecified: Secondary | ICD-10-CM

## 2014-04-08 MED ORDER — LISINOPRIL 20 MG PO TABS: 20.0000 mg | ORAL_TABLET | Freq: Every day | ORAL | Status: DC

## 2014-05-07 ENCOUNTER — Encounter: Payer: Self-pay | Admitting: Internal Medicine

## 2014-05-23 ENCOUNTER — Encounter (HOSPITAL_COMMUNITY): Payer: Self-pay | Admitting: Cardiology

## 2014-10-04 ENCOUNTER — Telehealth: Payer: Self-pay

## 2014-10-04 MED ORDER — TICAGRELOR 90 MG PO TABS: 90.0000 mg | ORAL_TABLET | Freq: Two times a day (BID) | ORAL | Status: DC

## 2014-10-04 NOTE — Telephone Encounter (Signed)
Yes, we can refill Brilinta.

## 2014-11-05 ENCOUNTER — Other Ambulatory Visit: Payer: Self-pay | Admitting: Cardiology

## 2015-01-14 ENCOUNTER — Other Ambulatory Visit (INDEPENDENT_AMBULATORY_CARE_PROVIDER_SITE_OTHER): Payer: 59

## 2015-01-14 ENCOUNTER — Telehealth: Payer: Self-pay | Admitting: *Deleted

## 2015-01-14 ENCOUNTER — Ambulatory Visit (INDEPENDENT_AMBULATORY_CARE_PROVIDER_SITE_OTHER): Payer: 59 | Admitting: Cardiology

## 2015-01-14 VITALS — BP 156/90 | HR 50 | Ht 69.0 in | Wt 228.0 lb

## 2015-01-14 DIAGNOSIS — E785 Hyperlipidemia, unspecified: Secondary | ICD-10-CM

## 2015-01-14 DIAGNOSIS — I2510 Atherosclerotic heart disease of native coronary artery without angina pectoris: Secondary | ICD-10-CM

## 2015-01-14 DIAGNOSIS — I10 Essential (primary) hypertension: Secondary | ICD-10-CM

## 2015-01-14 DIAGNOSIS — I209 Angina pectoris, unspecified: Secondary | ICD-10-CM

## 2015-01-14 DIAGNOSIS — I2583 Coronary atherosclerosis due to lipid rich plaque: Secondary | ICD-10-CM

## 2015-01-14 LAB — LIPID PANEL
Cholesterol: 110 mg/dL (ref 0–200)
HDL: 34.9 mg/dL — ABNORMAL LOW (ref >39.00)
LDL Cholesterol: 54 mg/dL (ref 0–99)
NonHDL: 75.47
Total CHOL/HDL Ratio: 3
Triglycerides: 108 mg/dL (ref 0.0–149.0)
VLDL: 21.6 mg/dL (ref 0.0–40.0)

## 2015-01-14 LAB — CBC WITH DIFFERENTIAL/PLATELET
Basophils Absolute: 0 10*3/uL (ref 0.0–0.1)
Basophils Relative: 0.6 % (ref 0.0–3.0)
Eosinophils Absolute: 0.2 10*3/uL (ref 0.0–0.7)
Eosinophils Relative: 2.8 % (ref 0.0–5.0)
HCT: 39.3 % (ref 39.0–52.0)
Hemoglobin: 13.5 g/dL (ref 13.0–17.0)
Lymphocytes Relative: 24.1 % (ref 12.0–46.0)
Lymphs Abs: 1.3 10*3/uL (ref 0.7–4.0)
MCHC: 34.4 g/dL (ref 30.0–36.0)
MCV: 94 fl (ref 78.0–100.0)
Monocytes Absolute: 0.5 10*3/uL (ref 0.1–1.0)
Monocytes Relative: 9.3 % (ref 3.0–12.0)
Neutro Abs: 3.4 10*3/uL (ref 1.4–7.7)
Neutrophils Relative %: 63.2 % (ref 43.0–77.0)
Platelets: 200 10*3/uL (ref 150.0–400.0)
RBC: 4.19 Mil/uL — ABNORMAL LOW (ref 4.22–5.81)
RDW: 13.2 % (ref 11.5–15.5)
WBC: 5.4 10*3/uL (ref 4.0–10.5)

## 2015-01-14 LAB — COMPREHENSIVE METABOLIC PANEL
ALT: 19 U/L (ref 0–53)
AST: 19 U/L (ref 0–37)
Albumin: 4.3 g/dL (ref 3.5–5.2)
Alkaline Phosphatase: 60 U/L (ref 39–117)
BUN: 19 mg/dL (ref 6–23)
CO2: 28 mEq/L (ref 19–32)
Calcium: 9.4 mg/dL (ref 8.4–10.5)
Chloride: 105 mEq/L (ref 96–112)
Creatinine, Ser: 1.04 mg/dL (ref 0.40–1.50)
GFR: 76.11 mL/min (ref >60.00)
Glucose, Bld: 99 mg/dL (ref 70–99)
Potassium: 3.8 mEq/L (ref 3.5–5.1)
Sodium: 138 mEq/L (ref 135–145)
Total Bilirubin: 0.5 mg/dL (ref 0.2–1.2)
Total Protein: 7 g/dL (ref 6.0–8.3)

## 2015-01-14 MED ORDER — LISINOPRIL 40 MG PO TABS: 40.0000 mg | ORAL_TABLET | Freq: Every day | ORAL | Status: DC

## 2015-01-14 NOTE — Patient Instructions (Signed)
Medication Instructions:   STOP TAKING BRILINTA NOW     Labwork:   TODAY---CHECK CMET, CBC W DIFF, AND LIPIDS    Follow-Up:  Your physician wants you to follow-up in: [place] will receive a reminder letter in the mail two months in advance. If you don't receive a letter, please call our office to schedule the follow-up appointment.   Any Other Special Instructions Will Be Listed Below (If Applicable).

## 2015-01-14 NOTE — Progress Notes (Signed)
Patient ID: Johnny Wilcox, male   DOB: [birth year], 64 y.o.   MRN: [id number]    Patient Name: Johnny Wilcox Date of Encounter: 01/14/2015  Primary Care Provider:  Leandrew Koyanagi, MD Primary Cardiologist:  Dorothy Spark  Problem List   Past Medical History  Diagnosis Date  . Hypertension   . Medical history non-contributory    Past Surgical History  Procedure Laterality Date  . Eye surgery      child injury-repair.    . Vasectomy    . Multiple extractions with alveoloplasty  12/06/2011    Procedure: MULTIPLE EXTRACION WITH ALVEOLOPLASTY;  Surgeon: [name], DDS;  Location: [place];  Service: Oral Surgery;  Laterality: Bilateral;  Biopsy tissue right vestibule  . Cataract extraction Right   . Coronary stent placement    . Left heart catheterization with coronary angiogram N/A 09/21/2013    Procedure: LEFT HEART CATHETERIZATION WITH CORONARY ANGIOGRAM;  Surgeon: [name], MD;  Location: [facility] CATH LAB;  Service: Cardiovascular;  Laterality: N/A;  . Percutaneous coronary stent intervention (pci-s)  09/21/2013    Procedure: PERCUTANEOUS CORONARY STENT INTERVENTION (PCI-S);  Surgeon: [name], MD;  Location: [facility] CATH LAB;  Service: Cardiovascular;;   Allergies  No Known Allergies  HPI  Johnny Wilcox is a 64 y.o. male with prior medical history of hypertension and erectile dysfunction was coming for concerns of chest pain. He originally presented to his primary care physician with concern of bilateral wrist pain that was constant result 2 weeks later. 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 At the same time he complained of chest pain that he experiences occasionally on exertion, at the same time he can work manually for multiple hours and on experience chest pain. He experiences chest pain frequently with sexual intercourse and describes his pain as retrosternal and sharp and can not be associated with any other symptoms such as  shortness of breath or palpitations. He states that last summer his blood pressure was low and his blood pressure medication was cut in half because of symptoms of dizziness. He has significant family history of coronary artery disease, his father had 2 myocardial infarctions in his 48s and eventually died of heart attack at age of 19. His mom died of heart attack in her early 64s.  Nuclear stress test was positive, s/p cath --> PCI/DES to RCA, on dual antiplatelet therapy. He reports resolution of CP and SOB, still some residual fatigue. Complains of easy bruising and prolonged bleeding (frequent injuries at work).  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 No palpitations or syncope, cramps frequently. No edema, no PND. Bruises easily.   Home Medications  Prior to Admission medications   Medication Sig Start Date End Date Taking? Authorizing Provider  lisinopril (PRINIVIL,ZESTRIL) 10 MG tablet Take 1 tablet (10 mg total) by mouth daily. 01/05/13  Yes Leandrew Koyanagi, MD  tadalafil (CIALIS) 20 MG tablet Take 1 tablet (20 mg total) by mouth daily as needed for erectile dysfunction. 3 month supply 04/26/13  Yes Leandrew Koyanagi, MD    Family History  Family History  Problem Relation Age of Onset  . Heart disease Father   . Heart disease Mother   . Hypertension Sister     Social History  History   Social History  . Marital Status: Married  Spouse Name: N/A  . Number of Children: N/A  . Years of Education: N/A   Occupational History  . Not on file.   Social History Main Topics  . Smoking status: Never Smoker   . Smokeless tobacco: Current User    Last Attempt to Quit: 09/06/2011  . Alcohol Use: Yes     Comment:  maybe 3 beers a month  . Drug Use: No  . Sexual Activity: Yes   Other Topics Concern  . Not on file   Social History Narrative     Review of Systems, as per HPI, otherwise  negative General:  No chills, fever, night sweats or weight changes.  Cardiovascular:  No chest pain, dyspnea on exertion, edema, orthopnea, palpitations, paroxysmal nocturnal dyspnea. Dermatological: No rash, lesions/masses Respiratory: No cough, dyspnea Urologic: No hematuria, dysuria Abdominal:   No nausea, vomiting, diarrhea, bright red blood per rectum, melena, or hematemesis Neurologic:  No visual changes, wkns, changes in mental status. All other systems reviewed and are otherwise negative except as noted above.  Physical Exam 146/90, HR 65 BPM There were no vitals taken for this visit.  General: Pleasant, NAD Psych: Normal affect. Neuro: Alert and oriented X 3. Moves all extremities spontaneously. HEENT: Normal  Neck: Supple without bruits or JVD. Lungs:  Resp regular and unlabored, CTA. Heart: RRR no s3, s4, or murmurs. Abdomen: Soft, non-tender, non-distended, BS + x 4.  Extremities: No clubbing, cyanosis or edema. DP/PT/Radials 2+ and equal bilaterally. Bruises on upper extremities  Labs:  No results for input(s): CKTOTAL, CKMB, TROPONINI in the last 72 hours. Lab Results  Component Value Date   WBC 6.7 01/07/2014   HGB 13.1 01/07/2014   HCT 38.4* 01/07/2014   MCV 95.7 01/07/2014   PLT 202.0 01/07/2014   No results for input(s): NA, K, CL, CO2, BUN, CREATININE, CALCIUM, PROT, BILITOT, ALKPHOS, ALT, AST, GLUCOSE in the last 168 hours.  Invalid input(s): LABALBU Lab Results  Component Value Date   CHOL 79 03/20/2014   HDL 41 03/20/2014   LDLCALC 27 03/20/2014   TRIG 54 03/20/2014   Accessory Clinical Findings  Echocardiogram - none  ECG - sinus bradycardia 65 beats per minute negative T waves in inferior lead, unchanged from 2015  Cardiac cath 09/21/13 PCI Data:  Vessel - RCA/Segment - mid  Percent Stenosis (pre) 90%  TIMI-flow 3  Stent 4.0 x 23 mm Alpine stent  Percent Stenosis (post) 0%  TIMI-flow (post) 3  Final Conclusions:  1. Single vessel  obstructive CAD  2. Normal LV function  3. Successful stenting of the mid RCA with a DES.  Recommendations:  Continue dual antiplatelet therapy for one year. Start statin for risk factor modification. Anticipate DC in am.     Assessment & Plan  64 year old male   1. CAD - positive stress test, s/p PCI to RCA on 09/21/13, on ASA and Brilinta. D/C Brillinta as he bruises easily.  2. Hypertension - uncontrolled on lisinopril 20 mg po daily, check CMP today, if normal Crea increase to 40 mg po daily  3. Hyperlipidemia - on atorvastatin 80 mg po daily, rechcek lipids today  Check CBC, CMP, lipids today  Followup in 1 year.  Dorothy Spark, MD, [facility] 01/14/2015, 8:39 AM

## 2015-01-14 NOTE — Telephone Encounter (Signed)
Informed the pt that per Dr Meda Coffee his labs were normal including lipids, kidney and liver function, and she recommends that he increase his  lisinopril to 40 mg po daily. Confirmed the pharmacy of choice with the pt.  Pt verbalized understanding and agrees with this plan.

## 2015-02-06 ENCOUNTER — Ambulatory Visit (INDEPENDENT_AMBULATORY_CARE_PROVIDER_SITE_OTHER): Payer: 59 | Admitting: Emergency Medicine

## 2015-02-06 VITALS — BP 156/94 | HR 62 | Temp 98.2°F | Resp 18 | Ht 69.5 in | Wt 223.0 lb

## 2015-02-06 DIAGNOSIS — I25118 Atherosclerotic heart disease of native coronary artery with other forms of angina pectoris: Secondary | ICD-10-CM | POA: Diagnosis not present

## 2015-02-06 DIAGNOSIS — E785 Hyperlipidemia, unspecified: Secondary | ICD-10-CM

## 2015-02-06 DIAGNOSIS — I10 Essential (primary) hypertension: Secondary | ICD-10-CM

## 2015-02-06 MED ORDER — NAPROXEN SODIUM 550 MG PO TABS: 550.0000 mg | ORAL_TABLET | Freq: Two times a day (BID) | ORAL | Status: DC

## 2015-02-06 MED ORDER — CYCLOBENZAPRINE HCL 10 MG PO TABS: 10.0000 mg | ORAL_TABLET | Freq: Three times a day (TID) | ORAL | Status: DC | PRN

## 2015-02-06 NOTE — Patient Instructions (Signed)

## 2015-02-06 NOTE — Progress Notes (Signed)
Subjective:  Patient ID: Johnny Wilcox, male    DOB: 01-Oct-1950  Age: 64 y.o. MRN: [id number]  CC: Chest Pain and Neck Pain   HPI Johnny Wilcox presents  with pain in his left shoulder and left side of his neck since the 10 days ago. He said the pain is been constant and not associated with activity. Is worse when he turns his head or use his left arm. He has no shortness of breath. No nausea vomiting. No diaphoresis. Does have a history of coronary artery disease hypertension hyperlipidemia. He saw his cardiologist earlier in the week and that was unremarkable visit. Blood pressure been elevated and he has been on stable dose of antihypertensives.  History Johnny Wilcox has a past medical history of Hypertension and Medical history non-contributory.   He has past surgical history that includes Eye surgery; Vasectomy; Multiple extractions with alveoloplasty (12/06/2011); Cataract extraction (Right); Coronary stent placement; left heart catheterization with coronary angiogram (N/A, 09/21/2013); and percutaneous coronary stent intervention (pci-s) (09/21/2013).   His  family history includes Heart disease in his father and mother; Hypertension in his sister.  He   reports that he has never smoked. He uses smokeless tobacco. He reports that he drinks alcohol. He reports that he does not use illicit drugs.  Outpatient Prescriptions Prior to Visit  Medication Sig Dispense Refill  . aspirin 81 MG chewable tablet Chew 1 tablet (81 mg total) by mouth daily.    [name] atorvastatin (LIPITOR) 80 MG tablet TAKE ONE TABLET BY MOUTH ONCE DAILY AT  6PM 90 tablet 0  . lisinopril (PRINIVIL,ZESTRIL) 40 MG tablet Take 1 tablet (40 mg total) by mouth daily. 90 tablet 3  . tadalafil (CIALIS) 20 MG tablet Take 1 tablet (20 mg total) by mouth daily as needed for erectile dysfunction. 3 month supply (Patient not taking: Reported on 02/06/2015) 15 tablet 3   No facility-administered medications prior to visit.    Social  History   Social History  . Marital Status: Married    Spouse Name: N/A  . Number of Children: N/A  . Years of Education: N/A   Social History Main Topics  . Smoking status: Never Smoker   . Smokeless tobacco: Current User    Last Attempt to Quit: 09/06/2011  . Alcohol Use: Yes     Comment:  maybe 3 beers a month  . Drug Use: No  . Sexual Activity: Yes   Other Topics Concern  . None   Social History Narrative     Review of Systems  Constitutional: Negative for fever, chills and appetite change.  HENT: Negative for congestion, ear pain, postnasal drip, sinus pressure and sore throat.   Eyes: Negative for pain and redness.  Respiratory: Negative for cough, shortness of breath and wheezing.   Cardiovascular: Negative for leg swelling.  Gastrointestinal: Negative for nausea, vomiting, abdominal pain, diarrhea, constipation and blood in stool.  Endocrine: Negative for polyuria.  Genitourinary: Negative for dysuria, urgency, frequency and flank pain.  Musculoskeletal: Negative for gait problem.  Skin: Negative for rash.  Neurological: Negative for weakness and headaches.  Psychiatric/Behavioral: Negative for confusion and decreased concentration. The patient is not nervous/anxious.     Objective:  BP 156/94 mmHg  Pulse 62  Temp(Src) 98.2 F (36.8 C) (Oral)  Resp 18  Ht 5' 9.5" (1.765 m)  Wt 223 lb (101.152 kg)  BMI 32.47 kg/m2  SpO2 98%  Physical Exam  Constitutional: He is oriented to person, place, and time. He  appears well-developed and well-nourished. No distress.  HENT:  Head: Normocephalic and atraumatic.  Right Ear: External ear normal.  Left Ear: External ear normal.  Nose: Nose normal.  Eyes: Conjunctivae and EOM are normal. Pupils are equal, round, and reactive to light. No scleral icterus.  Neck: Normal range of motion. Neck supple. No tracheal deviation present.  Cardiovascular: Normal rate, regular rhythm and normal heart sounds.   Pulmonary/Chest:  Effort normal. No respiratory distress. He has no wheezes. He has no rales.  Abdominal: He exhibits no mass. There is no tenderness. There is no rebound and no guarding.  Musculoskeletal: He exhibits no edema.  Lymphadenopathy:    He has no cervical adenopathy.  Neurological: He is alert and oriented to person, place, and time. Coordination normal.  Skin: Skin is warm and dry. No rash noted.  Psychiatric: He has a normal mood and affect. His behavior is normal.   his tenderness in the infraclavicular region this reproduces his pain in his shoulder and neck. He has a normal neurologic exam.    Assessment & Plan:   Johnny Wilcox was seen today for chest pain and neck pain.  Diagnoses and all orders for this visit:  Essential hypertension  Coronary artery disease involving native coronary artery with other forms of angina pectoris -     EKG 12-Lead  Hyperlipidemia-Mild  Other orders -     naproxen sodium (ANAPROX DS) 550 MG tablet; Take 1 tablet (550 mg total) by mouth 2 (two) times daily with a meal. -     cyclobenzaprine (FLEXERIL) 10 MG tablet; Take 1 tablet (10 mg total) by mouth 3 (three) times daily as needed for muscle spasms.   I am having Johnny Wilcox start on naproxen sodium and cyclobenzaprine. I am also having him maintain his aspirin, tadalafil, atorvastatin, and lisinopril.  Meds ordered this encounter  Medications  . naproxen sodium (ANAPROX DS) 550 MG tablet    Sig: Take 1 tablet (550 mg total) by mouth 2 (two) times daily with a meal.    Dispense:  40 tablet    Refill:  0  . cyclobenzaprine (FLEXERIL) 10 MG tablet    Sig: Take 1 tablet (10 mg total) by mouth 3 (three) times daily as needed for muscle spasms.    Dispense:  30 tablet    Refill:  0    Appropriate red flag conditions were discussed with the patient as well as actions that should be taken.  Patient expressed his understanding.  Follow-up: Return if symptoms worsen or fail to improve.  Roselee Culver, MD

## 2015-03-12 ENCOUNTER — Encounter: Payer: Self-pay | Admitting: Internal Medicine

## 2015-03-12 ENCOUNTER — Ambulatory Visit (INDEPENDENT_AMBULATORY_CARE_PROVIDER_SITE_OTHER): Payer: 59 | Admitting: Internal Medicine

## 2015-03-12 VITALS — BP 110/84 | HR 45 | Temp 98.1°F | Resp 16 | Ht 68.75 in | Wt 224.6 lb

## 2015-03-12 DIAGNOSIS — Z0000 Encounter for general adult medical examination without abnormal findings: Secondary | ICD-10-CM | POA: Diagnosis not present

## 2015-03-12 DIAGNOSIS — Z1211 Encounter for screening for malignant neoplasm of colon: Secondary | ICD-10-CM | POA: Diagnosis not present

## 2015-03-12 DIAGNOSIS — I10 Essential (primary) hypertension: Secondary | ICD-10-CM

## 2015-03-12 DIAGNOSIS — Z23 Encounter for immunization: Secondary | ICD-10-CM

## 2015-03-12 DIAGNOSIS — E785 Hyperlipidemia, unspecified: Secondary | ICD-10-CM

## 2015-03-12 MED ORDER — TADALAFIL 20 MG PO TABS: 20.0000 mg | ORAL_TABLET | Freq: Every day | ORAL | Status: DC | PRN

## 2015-03-12 MED ORDER — ATORVASTATIN CALCIUM 80 MG PO TABS: 80.0000 mg | ORAL_TABLET | Freq: Every day | ORAL | Status: DC

## 2015-03-12 NOTE — Progress Notes (Signed)
Subjective:    Patient ID: Johnny Wilcox, male    DOB: [birth year], 64 y.o.   MRN: [id number]  HPIannaul  Home bp all ok x 1d q 2-3 weeks Recent cp resolved with activity Feeling good today  Patient Active Problem List   Diagnosis Date Noted  . Hearing loss at 3000 bilat 03/2014 03/20/2014  . CTS (carpal tunnel syndrome)-bilat 2015 03/20/2014  . Full dentures 03/20/2014  . CAD (coronary artery disease) 10/04/2013  . Angina pectoris 09/21/2013  . Chest pain 08/22/2013  . ED (erectile dysfunction) 04/04/2012  . HTN (hypertension) 03/22/2012  . BMI 34.0-34.9,adult 03/22/2012  . Diverticulosis Colonoscopy 2003 03/22/2012  . BPH (benign prostatic hyperplasia)-mild July 2003 03/22/2012  . Hyperlipidemia-Mild 03/22/2012  meds- lisin/lipit/cialis  Dr Meda Coffee CV--says good  HM iss disc--did not respond to referral for colonoscopy Is not interested other interventions but will consider rescheduling  Review of Systems 14pt pos only for known joint was related to working but not debilitating not requiring medication and for recent issues with constipation and diarrhea which he thinks are related to travel to work with inability to find a bathroom. He notes that fiber supplements to relieve constipation produced several loose stools in a day. No hematochezia or melanoma. No abdominal pain. No change in diet. No weight loss or night sweats.    Objective:   Physical Exam  Constitutional: He is oriented to person, place, and time. He appears well-developed and well-nourished.  HENT:  Head: Normocephalic and atraumatic.  Right Ear: Hearing, tympanic membrane, external ear and ear canal normal.  Left Ear: Hearing, tympanic membrane, external ear and ear canal normal.  Nose: Nose normal.  Mouth/Throat: Uvula is midline, oropharynx is clear and moist and mucous membranes are normal.  Eyes: Conjunctivae, EOM and lids are normal. Pupils are equal, round, and reactive to light. Right eye exhibits  no discharge. Left eye exhibits no discharge. No scleral icterus.  Neck: Trachea normal and normal range of motion. Neck supple. Carotid bruit is not present.  Cardiovascular: Normal rate, regular rhythm, normal heart sounds, intact distal pulses and normal pulses.   No murmur heard. Pulmonary/Chest: Effort normal and breath sounds normal. No respiratory distress. He has no wheezes. He has no rhonchi. He has no rales.  Abdominal: Soft. Normal appearance and bowel sounds are normal. He exhibits no abdominal bruit. There is no tenderness.  Musculoskeletal: Normal range of motion. He exhibits no edema or tenderness.  Lymphadenopathy:       Head (right side): No submental, no submandibular, no tonsillar, no preauricular, no posterior auricular and no occipital adenopathy present.       Head (left side): No submental, no submandibular, no tonsillar, no preauricular, no posterior auricular and no occipital adenopathy present.    He has no cervical adenopathy.  Neurological: He is alert and oriented to person, place, and time. He has normal strength and normal reflexes. No cranial nerve deficit or sensory deficit. Coordination and gait normal.  Skin: Skin is warm, dry and intact. No lesion and no rash noted.  Psychiatric: He has a normal mood and affect. His speech is normal and behavior is normal. Judgment and thought content normal.    BP 110/84 mmHg  Pulse 45  Temp(Src) 98.1 F (36.7 C) (Oral)  Resp 16  Ht 5' 8.75" (1.746 m)  Wt 224 lb 9.6 oz (101.878 kg)  BMI 33.42 kg/m2  SpO2 97%       Assessment & Plan:  Routine general medical examination  at a health care facility  Flu vaccine need - Plan: Flu Vaccine QUAD 36+ mos IM  Special screening for malignant neoplasms, colon - Plan: Ambulatory referral to Gastroenterology  Essential hypertension-good contr  Hyperlipidemia-- contr on meds  ED -contr cial Meds ordered this encounter  Medications  . atorvastatin (LIPITOR) 80 MG tablet      Sig: Take 1 tablet (80 mg total) by mouth daily.    Dispense:  90 tablet    Refill:  3  . tadalafil (CIALIS) 20 MG tablet    Sig: Take 1 tablet (20 mg total) by mouth daily as needed for erectile dysfunction. 3 month supply    Dispense:  15 tablet    Refill:  3   Ref colo Lis/hct good 33mon

## 2015-03-19 ENCOUNTER — Telehealth: Payer: Self-pay

## 2015-03-19 MED ORDER — ATORVASTATIN CALCIUM 80 MG PO TABS: 80.0000 mg | ORAL_TABLET | Freq: Every day | ORAL | Status: DC

## 2015-03-19 NOTE — Telephone Encounter (Signed)
Pt's wife came to office and asked if I could send in a Rx for a 90 day supply of atorvastatin and advised that [facility] supplied with a very large tablet and her Rx gotten at another pharm was exactly the same strength, but small tablets. I called [facility] to make sure they received the 90 day Rx sent by Dr L, and they stated they did and that was what the #30 was filled from (not sure why only #30 were given). Per agreement w/wife, I will sent the Rx to [facility] to have them fill it to see if they use a manufacturer w/smaller tablet.

## 2015-03-26 ENCOUNTER — Encounter: Payer: Self-pay | Admitting: Gastroenterology

## 2015-05-12 ENCOUNTER — Encounter: Payer: Self-pay | Admitting: Internal Medicine

## 2015-05-14 ENCOUNTER — Other Ambulatory Visit: Payer: Self-pay | Admitting: Internal Medicine

## 2015-05-14 ENCOUNTER — Ambulatory Visit (AMBULATORY_SURGERY_CENTER): Payer: Self-pay

## 2015-05-14 VITALS — Ht 69.5 in | Wt 230.8 lb

## 2015-05-14 DIAGNOSIS — Z1211 Encounter for screening for malignant neoplasm of colon: Secondary | ICD-10-CM

## 2015-05-14 MED ORDER — LISINOPRIL 40 MG PO TABS: 40.0000 mg | ORAL_TABLET | Freq: Every day | ORAL | Status: DC

## 2015-05-14 MED ORDER — SUPREP BOWEL PREP KIT 17.5-3.13-1.6 GM/177ML PO SOLN: 1.0000 | Freq: Once | ORAL | Status: DC

## 2015-05-14 NOTE — Progress Notes (Signed)
No allergies to eggs or soy No past problems with anesthesia No home oxygen No diet/weight loss meds  Has email and internet; refused emmi 

## 2015-05-28 ENCOUNTER — Encounter: Payer: Self-pay | Admitting: Gastroenterology

## 2015-05-28 ENCOUNTER — Ambulatory Visit (AMBULATORY_SURGERY_CENTER): Payer: 59 | Admitting: Gastroenterology

## 2015-05-28 VITALS — BP 125/68 | HR 53 | Temp 97.1°F | Resp 20 | Ht 69.5 in | Wt 230.0 lb

## 2015-05-28 DIAGNOSIS — Z1211 Encounter for screening for malignant neoplasm of colon: Secondary | ICD-10-CM | POA: Diagnosis present

## 2015-05-28 DIAGNOSIS — D120 Benign neoplasm of cecum: Secondary | ICD-10-CM

## 2015-05-28 DIAGNOSIS — D121 Benign neoplasm of appendix: Secondary | ICD-10-CM | POA: Diagnosis not present

## 2015-05-28 MED ORDER — SODIUM CHLORIDE 0.9 % IV SOLN: 500.0000 mL | INTRAVENOUS | Status: DC

## 2015-05-28 NOTE — Progress Notes (Signed)
A/ox3, pleased with MAC, report to RN 

## 2015-05-28 NOTE — Patient Instructions (Signed)
Colon polyps removed today and diverticulosis seen. Handouts given on polyps,diverticulosis.  YOU HAD AN ENDOSCOPIC PROCEDURE TODAY AT [facility]:   Refer to the procedure report that was given to you for any specific questions about what was found during the examination.  If the procedure report does not answer your questions, please call your gastroenterologist to clarify.  If you requested that your care partner not be given the details of your procedure findings, then the procedure report has been included in a sealed envelope for you to review at your convenience later.  YOU SHOULD EXPECT: Some feelings of bloating in the abdomen. Passage of more gas than usual.  Walking can help get rid of the air that was put into your GI tract during the procedure and reduce the bloating. If you had a lower endoscopy (such as a colonoscopy or flexible sigmoidoscopy) you may notice spotting of blood in your stool or on the toilet paper. If you underwent a bowel prep for your procedure, you may not have a normal bowel movement for a few days.  Please Note:  You might notice some irritation and congestion in your nose or some drainage.  This is from the oxygen used during your procedure.  There is no need for concern and it should clear up in a day or so.  SYMPTOMS TO REPORT IMMEDIATELY:   Following lower endoscopy (colonoscopy or flexible sigmoidoscopy):  Excessive amounts of blood in the stool  Significant tenderness or worsening of abdominal pains  Swelling of the abdomen that is new, acute  Fever of 100F or higher  For urgent or emergent issues, a gastroenterologist can be reached at any hour by calling [phone].   DIET: Your first meal following the procedure should be a small meal and then it is ok to progress to your normal diet. Heavy or fried foods are harder to digest and may make you feel nauseous or bloated.  Likewise, meals heavy in dairy and vegetables can increase  bloating.  Drink plenty of fluids but you should avoid alcoholic beverages for 24 hours.  ACTIVITY:  You should plan to take it easy for the rest of today and you should NOT DRIVE or use heavy machinery until tomorrow (because of the sedation medicines used during the test).    FOLLOW UP: Our staff will call the number listed on your records the next business day following your procedure to check on you and address any questions or concerns that you may have regarding the information given to you following your procedure. If we do not reach you, we will leave a message.  However, if you are feeling well and you are not experiencing any problems, there is no need to return our call.  We will assume that you have returned to your regular daily activities without incident.  If any biopsies were taken you will be contacted by phone or by letter within the next 1-3 weeks.  Please call us at [phone] if you have not heard about the biopsies in 3 weeks.    SIGNATURES/CONFIDENTIALITY: You and/or your care partner have signed paperwork which will be entered into your electronic medical record.  These signatures attest to the fact that that the information above on your After Visit Summary has been reviewed and is understood.  Full responsibility of the confidentiality of this discharge information lies with you and/or your care-partner.

## 2015-05-28 NOTE — Progress Notes (Signed)
Called to room to assist during endoscopic procedure.  Patient ID and intended procedure confirmed with present staff. Received instructions for my participation in the procedure from the performing physician.  

## 2015-05-29 ENCOUNTER — Telehealth: Payer: Self-pay

## 2015-05-29 NOTE — Op Note (Signed)
[facility]  [address] [employer] [address] [place], [address]   COLONOSCOPY PROCEDURE REPORT  PATIENT: Johnny Wilcox, Johnny Wilcox  MR#: [id number] BIRTHDATE: 10/02/1950 , 1  yrs. old GENDER: male ENDOSCOPIST: Harl Bowie, MD REFERRED [id number] Laney Pastor, M.D. PROCEDURE DATE:  05/28/2015 PROCEDURE:   Colonoscopy, screening and Colonoscopy with cold biopsy polypectomy First Screening Colonoscopy - Avg.  risk and is 50 yrs.  old or older - No.  Prior Negative Screening - Now for repeat screening. 10 or more years since last screening  History of Adenoma - Now for follow-up colonoscopy & has been > or = to 3 yrs.  N/A  Polyps removed today? Yes ASA CLASS:   Class II INDICATIONS:Screening for colonic neoplasia and Colorectal Neoplasm Risk Assessment for this procedure is average risk. MEDICATIONS: Propofol 250 mg IV  DESCRIPTION OF PROCEDURE:   After the risks benefits and alternatives of the procedure were thoroughly explained, informed consent was obtained.  The digital rectal exam revealed no abnormalities of the rectum.   The [id number]  endoscope was introduced through the anus and advanced to the cecum, which was identified by both the appendix and ileocecal valve. No adverse events experienced.   The quality of the prep was good.  The instrument was then slowly withdrawn as the colon was fully examined. Estimated blood loss is zero unless otherwise noted in this procedure report.  COLON FINDINGS: There was moderate diverticulosis noted in the descending colon and sigmoid colon with associated tortuosity.   A sessile polyp ranging between 3-19mm in size was found at the appendiceal orifice.  A polypectomy was performed with cold forceps.  The resection was complete, the polyp tissue was completely retrieved and sent to histology.  Retroflexed views revealed no abnormalities. The time to cecum = 4.5 Withdrawal time = 10.6   The scope was withdrawn and the procedure  completed. COMPLICATIONS: There were no immediate complications.  ENDOSCOPIC IMPRESSION: 1.   There was moderate diverticulosis noted in the descending colon and sigmoid colon 2.   Sessile polyp ranging between 3-25mm in size was found at the appendiceal orifice; polypectomy was performed with cold forceps  RECOMMENDATIONS: If the polyp(s) removed today are proven to be adenomatous (pre-cancerous) polyps, you will need a repeat colonoscopy in 5 years.  Otherwise you should continue to follow colorectal cancer screening guidelines for "routine risk" patients with colonoscopy in 10 years.  You will receive a letter within 1-2 weeks with the results of your biopsy as well as final recommendations.  Please call my office if you have not received a letter after 3 weeks.  eSigned:  Harl Bowie, MD 05/28/2015 2:11 PM

## 2015-05-29 NOTE — Telephone Encounter (Signed)
  Follow up Call-  Call back number 05/28/2015  Post procedure Call Back phone  # [phone] cell  Permission to leave phone message Yes     Patient questions:  Do you have a fever, pain , or abdominal swelling? No. Pain Score  0 *  Have you tolerated food without any problems? Yes.    Have you been able to return to your normal activities? Yes.    Do you have any questions about your discharge instructions: Diet   No. Medications  No. Follow up visit  No.  Do you have questions or concerns about your Care? No.  Actions: * If pain score is 4 or above: No action needed, pain <4.

## 2015-06-03 ENCOUNTER — Encounter: Payer: Self-pay | Admitting: Gastroenterology

## 2015-08-10 ENCOUNTER — Ambulatory Visit (INDEPENDENT_AMBULATORY_CARE_PROVIDER_SITE_OTHER): Payer: 59 | Admitting: Internal Medicine

## 2015-08-10 ENCOUNTER — Ambulatory Visit (INDEPENDENT_AMBULATORY_CARE_PROVIDER_SITE_OTHER): Payer: 59

## 2015-08-10 VITALS — BP 166/94 | HR 50 | Temp 98.4°F | Ht 69.0 in | Wt 226.0 lb

## 2015-08-10 DIAGNOSIS — M5416 Radiculopathy, lumbar region: Secondary | ICD-10-CM | POA: Diagnosis not present

## 2015-08-10 DIAGNOSIS — M25562 Pain in left knee: Secondary | ICD-10-CM

## 2015-08-10 DIAGNOSIS — I10 Essential (primary) hypertension: Secondary | ICD-10-CM

## 2015-08-10 DIAGNOSIS — G8929 Other chronic pain: Secondary | ICD-10-CM

## 2015-08-10 MED ORDER — PREDNISONE 20 MG PO TABS: ORAL_TABLET | ORAL | Status: DC

## 2015-08-10 MED ORDER — MELOXICAM 15 MG PO TABS
15.0000 mg | ORAL_TABLET | Freq: Every day | ORAL | Status: DC
Start: 2015-08-10 — End: 2016-05-20

## 2015-08-10 NOTE — Progress Notes (Signed)
Subjective:  By signing my name below, I, [name], attest that this documentation has been prepared under the direction and in the presence of [name], MD. Electronically Signed: [name], [place]. 08/10/2015 , 9:39 AM .  Patient was seen in Room 2 .   Patient ID: Johnny Wilcox, male    DOB: [birth year], 65 y.o.   MRN: [id number] Chief Complaint  Patient presents with  . Leg Swelling    left leg swelling    HPI Johnny Wilcox is a 65 y.o. male who presents to [facility] complaining of left leg swelling since July 2016. He had a DOT physical done at the time, and had some left leg swelling without any pain. Now, it hurts almost all the time, even just sitting and without any activity. The pain is aggravating when sitting or driving for a long time. There's a lot of discomfort when going up stairs. He notes having some swelling and pain up to his knee. He also informs some pain radiating to his lower back. His pain improves as he gets up to walk on it.   Patient Active Problem List   Diagnosis Date Noted  . Hearing loss at 3000 bilat 03/2014 03/20/2014  . CTS (carpal tunnel syndrome)-bilat 2015 03/20/2014  . Full dentures 03/20/2014  . CAD (coronary artery disease) 10/04/2013  . Angina pectoris ([place]) 09/21/2013  . Chest pain 08/22/2013  . ED (erectile dysfunction) 04/04/2012  . HTN (hypertension) 03/22/2012  . BMI 34.0-34.9,adult 03/22/2012  . Diverticulosis Colonoscopy 2003 03/22/2012  . BPH (benign prostatic hyperplasia)-mild July 2003 03/22/2012  . Hyperlipidemia-Mild 03/22/2012    Current outpatient prescriptions:  .  aspirin 81 MG chewable tablet, Chew 1 tablet (81 mg total) by mouth daily., Disp: , Rfl:  .  atorvastatin (LIPITOR) 80 MG tablet, Take 1 tablet (80 mg total) by mouth daily., Disp: 90 tablet, Rfl: 3 .  lisinopril (PRINIVIL,ZESTRIL) 40 MG tablet, Take 1 tablet (40 mg total) by mouth daily., Disp: 90 tablet, Rfl: 3 .  tadalafil (CIALIS) 20 MG tablet,  Take 1 tablet (20 mg total) by mouth daily as needed for erectile dysfunction. 3 month supply, Disp: 15 tablet, Rfl: 3   Review of Systems  Constitutional: Negative for fever, chills, activity change and fatigue.  Gastrointestinal: Negative for nausea, vomiting and diarrhea.  Musculoskeletal: Positive for myalgias, back pain, joint swelling and arthralgias. Negative for gait problem.  Skin: Negative for rash and wound.       Objective:   Physical Exam  Constitutional: He is oriented to person, place, and time. He appears well-developed and well-nourished. No distress.  HENT:  Head: Normocephalic and atraumatic.  Eyes: EOM are normal. Pupils are equal, round, and reactive to light.  Neck: Neck supple.  Cardiovascular: Normal rate.   Pulmonary/Chest: Effort normal. No respiratory distress.  Musculoskeletal: Normal range of motion.  Left leg: nontender at patellar joint line, ligaments good, negative straight leg raise bilaterally, full extension, good strength left lower extremity  Neurological: He is alert and oriented to person, place, and time.  Skin: Skin is warm and dry.  Psychiatric: He has a normal mood and affect. His behavior is normal.  Nursing note and vitals reviewed.  BP 166/94 mmHg  Pulse 50  Temp(Src) 98.4 F (36.9 C) (Oral)  Ht 5\' 9"  (1.753 m)  Wt 226 lb (102.513 kg)  BMI 33.36 kg/m2  SpO2 97%  [facility] reading (PRIMARY) by Dr. Laney Pastor : left knee xray: mild loss of space in  the med compartment L-spine xray: spondylolisthesis on L5-S1 with chronic changes vertebrae L4, L3, L2     Assessment & Plan:  Knee pain, chronic, left - Plan: DG Knee Complete 4 Views Left  Lumbar radicular pain - Plan: DG Lumbar Spine 2-3 Views  Essential hypertension--uncontrolled//?due to pain--follow BP at home daily//call if not back in control next 7-10d  Meds ordered this encounter  Medications  . predniSONE (DELTASONE) 20 MG tablet    Sig: 3/3/3/2/2/2/1/1/1 single daily dose  for 9 days    Dispense:  18 tablet    Refill:  0  . meloxicam (MOBIC) 15 MG tablet    Sig: Take 1 tablet (15 mg total) by mouth daily.    Dispense:  30 tablet    Refill:  0  stretching Call if not well 3 w for ortho eval To work in [place] after 09/11/15  I have completed the patient encounter in its entirety as documented by the scribe, with editing by me where necessary. Merri Dimaano P. Laney Pastor, M.D.

## 2015-09-08 ENCOUNTER — Other Ambulatory Visit: Payer: Self-pay | Admitting: Internal Medicine

## 2016-01-08 ENCOUNTER — Other Ambulatory Visit: Payer: Self-pay

## 2016-01-08 MED ORDER — ATORVASTATIN CALCIUM 80 MG PO TABS: 80.0000 mg | ORAL_TABLET | Freq: Every day | ORAL | 0 refills | Status: DC

## 2016-04-30 ENCOUNTER — Other Ambulatory Visit: Payer: Self-pay | Admitting: Family Medicine

## 2016-05-13 ENCOUNTER — Encounter: Payer: 59 | Admitting: Family Medicine

## 2016-05-20 ENCOUNTER — Ambulatory Visit (INDEPENDENT_AMBULATORY_CARE_PROVIDER_SITE_OTHER): Payer: 59 | Admitting: Family Medicine

## 2016-05-20 ENCOUNTER — Encounter: Payer: Self-pay | Admitting: Family Medicine

## 2016-05-20 VITALS — BP 150/80 | HR 62 | Temp 98.7°F | Resp 16 | Ht 68.5 in | Wt 233.6 lb

## 2016-05-20 DIAGNOSIS — Z0000 Encounter for general adult medical examination without abnormal findings: Secondary | ICD-10-CM | POA: Diagnosis not present

## 2016-05-20 DIAGNOSIS — N401 Enlarged prostate with lower urinary tract symptoms: Secondary | ICD-10-CM | POA: Diagnosis not present

## 2016-05-20 DIAGNOSIS — Z136 Encounter for screening for cardiovascular disorders: Secondary | ICD-10-CM | POA: Diagnosis not present

## 2016-05-20 DIAGNOSIS — R350 Frequency of micturition: Secondary | ICD-10-CM | POA: Diagnosis not present

## 2016-05-20 DIAGNOSIS — I10 Essential (primary) hypertension: Secondary | ICD-10-CM | POA: Diagnosis not present

## 2016-05-20 DIAGNOSIS — Z113 Encounter for screening for infections with a predominantly sexual mode of transmission: Secondary | ICD-10-CM

## 2016-05-20 DIAGNOSIS — Z1383 Encounter for screening for respiratory disorder NEC: Secondary | ICD-10-CM | POA: Diagnosis not present

## 2016-05-20 DIAGNOSIS — Z23 Encounter for immunization: Secondary | ICD-10-CM | POA: Diagnosis not present

## 2016-05-20 DIAGNOSIS — Z1389 Encounter for screening for other disorder: Secondary | ICD-10-CM

## 2016-05-20 LAB — POCT URINALYSIS DIP (MANUAL ENTRY)
Bilirubin, UA: NEGATIVE
Blood, UA: NEGATIVE
GLUCOSE UA: NEGATIVE
Ketones, POC UA: NEGATIVE
LEUKOCYTES UA: NEGATIVE
NITRITE UA: NEGATIVE
Protein Ur, POC: NEGATIVE
Spec Grav, UA: 1.02
Urobilinogen, UA: 0.2
pH, UA: 6

## 2016-05-20 MED ORDER — ATORVASTATIN CALCIUM 80 MG PO TABS
80.0000 mg | ORAL_TABLET | Freq: Every day | ORAL | 3 refills | Status: DC
Start: 2016-05-20 — End: 2016-05-28

## 2016-05-20 MED ORDER — TADALAFIL 20 MG PO TABS: ORAL_TABLET | ORAL | 3 refills | Status: DC

## 2016-05-20 MED ORDER — DOXAZOSIN MESYLATE 4 MG PO TABS: 4.0000 mg | ORAL_TABLET | Freq: Every day | ORAL | 1 refills | Status: DC

## 2016-05-20 MED ORDER — LISINOPRIL 40 MG PO TABS: 40.0000 mg | ORAL_TABLET | Freq: Every day | ORAL | 3 refills | Status: DC

## 2016-05-20 NOTE — Patient Instructions (Addendum)
IF you received an x-ray today, you will receive an invoice from [facility]. Please contact [facility] at [phone] with questions or concerns regarding your invoice.   IF you received labwork today, you will receive an invoice from [employer]. Please contact [facility] at [phone] with questions or concerns regarding your invoice.   Our billing staff will not be able to assist you with questions regarding bills from these companies.  You will be contacted with the lab results as soon as they are available. The fastest way to get your results is to activate your My Chart account. Instructions are located on the last page of this paperwork. If you have not heard from [place] regarding the results in 2 weeks, please contact this office.    Benign Prostatic Hyperplasia An enlarged prostate (benign prostatic hyperplasia) is common in older men. You may experience the following:  Weak urine stream.  Dribbling.  Feeling like the bladder has not emptied completely.  Difficulty starting urination.  Getting up frequently at night to urinate.  Urinating more frequently during the day. HOME CARE INSTRUCTIONS  Monitor your prostatic hyperplasia for any changes. The following actions may help to alleviate any discomfort you are experiencing:  Give yourself time when you urinate.  Stay away from alcohol.  Avoid beverages containing caffeine, such as coffee, tea, and colas, because they can make the problem worse.  Avoid decongestants, antihistamines, and some prescription medicines that can make the problem worse.  Follow up with your health care provider for further treatment as recommended. SEEK MEDICAL CARE IF:  You are experiencing progressive difficulty voiding.  Your urine stream is progressively getting narrower.  You are awaking from sleep with the urge to void more frequently.  You are constantly feeling the need to  void.  You experience loss of urine, especially in small amounts. SEEK IMMEDIATE MEDICAL CARE IF:   You develop increased pain with urination or are unable to urinate.  You develop severe abdominal pain, vomiting, a high fever, or fainting.  You develop back pain or blood in your urine. MAKE SURE YOU:   Understand these instructions.  Will watch your condition.  Will get help right away if you are not doing well or get worse. This information is not intended to replace advice given to you by your health care provider. Make sure you discuss any questions you have with your health care provider. Document Released: 05/31/2005 Document Revised: 06/21/2014 Document Reviewed: 10/31/2012 Elsevier Interactive Patient Education  2017 [employer]. Tdap Vaccine (Tetanus, Diphtheria and Pertussis): What You Need to Know 1. Why get vaccinated? Tetanus, diphtheria and pertussis are very serious diseases. Tdap vaccine can protect [place] from these diseases. And, Tdap vaccine given to pregnant women can protect newborn babies against pertussis. TETANUS (Lockjaw) is rare in the [place] today. It causes painful muscle tightening and stiffness, usually all over the body.  It can lead to tightening of muscles in the head and neck so you can't open your mouth, swallow, or sometimes even breathe. Tetanus kills about 1 out of 10 people who are infected even after receiving the best medical care. DIPHTHERIA is also rare in the [place] today. It can cause a thick coating to form in the back of the throat.  It can lead to breathing problems, heart failure, paralysis, and death. PERTUSSIS (Whooping Cough) causes severe coughing spells, which can cause difficulty breathing, vomiting and disturbed sleep.  It can also lead to  weight loss, incontinence, and rib fractures. Up to 2 in 100 adolescents and 5 in 100 adults with pertussis are hospitalized or have complications, which could include pneumonia or  death. These diseases are caused by bacteria. Diphtheria and pertussis are spread from person to person through secretions from coughing or sneezing. Tetanus enters the body through cuts, scratches, or wounds. Before vaccines, as many as 200,000 cases of diphtheria, 200,000 cases of pertussis, and hundreds of cases of tetanus, were reported in the [place] each year. Since vaccination began, reports of cases for tetanus and diphtheria have dropped by about 99% and for pertussis by about 80%. 2. Tdap vaccine Tdap vaccine can protect adolescents and adults from tetanus, diphtheria, and pertussis. One dose of Tdap is routinely given at age 29 or 2. People who did not get Tdap at that age should get it as soon as possible. Tdap is especially important for healthcare professionals and anyone having close contact with a baby younger than 12 months. Pregnant women should get a dose of Tdap during every pregnancy, to protect the newborn from pertussis. Infants are most at risk for severe, life-threatening complications from pertussis. Another vaccine, called Td, protects against tetanus and diphtheria, but not pertussis. A Td booster should be given every 10 years. Tdap may be given as one of these boosters if you have never gotten Tdap before. Tdap may also be given after a severe cut or burn to prevent tetanus infection. Your doctor or the person giving you the vaccine can give you more information. Tdap may safely be given at the same time as other vaccines. 3. Some people should not get this vaccine  A person who has ever had a life-threatening allergic reaction after a previous dose of any diphtheria, tetanus or pertussis containing vaccine, OR has a severe allergy to any part of this vaccine, should not get Tdap vaccine. Tell the person giving the vaccine about any severe allergies.  Anyone who had coma or long repeated seizures within 7 days after a childhood dose of DTP or DTaP, or a previous  dose of Tdap, should not get Tdap, unless a cause other than the vaccine was found. They can still get Td.  Talk to your doctor if you:  have seizures or another nervous system problem,  had severe pain or swelling after any vaccine containing diphtheria, tetanus or pertussis,  ever had a condition called Guillain-Barr Syndrome (GBS),  aren't feeling well on the day the shot is scheduled. 4. Risks With any medicine, including vaccines, there is a chance of side effects. These are usually mild and go away on their own. Serious reactions are also possible but are rare. Most people who get Tdap vaccine do not have any problems with it. Mild problems following Tdap: (Did not interfere with activities)  Pain where the shot was given (about 3 in 4 adolescents or 2 in 3 adults)  Redness or swelling where the shot was given (about 1 person in 5)  Mild fever of at least 100.67F (up to about 1 in 25 adolescents or 1 in 100 adults)  Headache (about 3 or 4 people in 10)  Tiredness (about 1 person in 3 or 4)  Nausea, vomiting, diarrhea, stomach ache (up to 1 in 4 adolescents or 1 in 10 adults)  Chills, sore joints (about 1 person in 10)  Body aches (about 1 person in 3 or 4)  Rash, swollen glands (uncommon) Moderate problems following Tdap: (Interfered with activities, but did  not require medical attention)  Pain where the shot was given (up to 1 in 5 or 6)  Redness or swelling where the shot was given (up to about 1 in 16 adolescents or 1 in 12 adults)  Fever over 102F (about 1 in 100 adolescents or 1 in 250 adults)  Headache (about 1 in 7 adolescents or 1 in 10 adults)  Nausea, vomiting, diarrhea, stomach ache (up to 1 or 3 people in 100)  Swelling of the entire arm where the shot was given (up to about 1 in 500). Severe problems following Tdap: (Unable to perform usual activities; required medical attention)  Swelling, severe pain, bleeding and redness in the arm where the  shot was given (rare). Problems that could happen after any vaccine:  People sometimes faint after a medical procedure, including vaccination. Sitting or lying down for about 15 minutes can help prevent fainting, and injuries caused by a fall. Tell your doctor if you feel dizzy, or have vision changes or ringing in the ears.  Some people get severe pain in the shoulder and have difficulty moving the arm where a shot was given. This happens very rarely.  Any medication can cause a severe allergic reaction. Such reactions from a vaccine are very rare, estimated at fewer than 1 in a million doses, and would happen within a few minutes to a few hours after the vaccination. As with any medicine, there is a very remote chance of a vaccine causing a serious injury or death. The safety of vaccines is always being monitored. For more information, visit: http://www.aguilar.org/ 5. What if there is a serious problem? What should I look for? Look for anything that concerns you, such as signs of a severe allergic reaction, very high fever, or unusual behavior. Signs of a severe allergic reaction can include hives, swelling of the face and throat, difficulty breathing, a fast heartbeat, dizziness, and weakness. These would usually start a few minutes to a few hours after the vaccination. What should I do?  If you think it is a severe allergic reaction or other emergency that can't wait, call 9-1-1 or get the person to the nearest hospital. Otherwise, call your doctor.  Afterward, the reaction should be reported to the Vaccine Adverse Event Reporting System (VAERS). Your doctor might file this report, or you can do it yourself through the VAERS web site at www.vaers.SamedayNews.es, or by calling (709)506-0693.  VAERS does not give medical advice. 6. The National Vaccine Injury Compensation Program The [employer] Vaccine Injury Compensation Program (VICP) is a federal program that was created to compensate people  who may have been injured by certain vaccines. Persons who believe they may have been injured by a vaccine can learn about the program and about filing a claim by calling 915-143-4240 or visiting the [place] website at GoldCloset.com.ee. There is a time limit to file a claim for compensation. 7. How can I learn more?  Ask your doctor. He or she can give you the vaccine package insert or suggest other sources of information.  Call your local or state health department.  Contact the Centers for Disease Control and Prevention (CDC):  Call 404-417-9391 (1-800-CDC-INFO) or  Visit CDC's website at http://hunter.com/ CDC Tdap Vaccine VIS (08/07/13) This information is not intended to replace advice given to you by your health care provider. Make sure you discuss any questions you have with your health care provider. Document Released: 11/30/2011 Document Revised: 02/19/2016 Document Reviewed: 02/19/2016 Elsevier Interactive Patient Education  2017  [employer].
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
## 2016-05-21 LAB — COMPREHENSIVE METABOLIC PANEL
A/G RATIO: 1.9 (ref 1.2–2.2)
ALBUMIN: 4.4 g/dL (ref 3.6–4.8)
ALT: 25 IU/L (ref 0–44)
AST: 21 IU/L (ref 0–40)
Alkaline Phosphatase: 81 IU/L (ref 39–117)
BUN / CREAT RATIO: 21 (ref 10–24)
BUN: 21 mg/dL (ref 8–27)
Bilirubin Total: 0.4 mg/dL (ref 0.0–1.2)
CO2: 27 mmol/L (ref 18–29)
CREATININE: 1.01 mg/dL (ref 0.76–1.27)
Calcium: 9.2 mg/dL (ref 8.6–10.2)
Chloride: 101 mmol/L (ref 96–106)
GFR calc Af Amer: 89 mL/min/{1.73_m2} (ref >59)
GFR calc non Af Amer: 77 mL/min/{1.73_m2} (ref >59)
GLOBULIN, TOTAL: 2.3 g/dL (ref 1.5–4.5)
Glucose: 96 mg/dL (ref 65–99)
POTASSIUM: 4.5 mmol/L (ref 3.5–5.2)
SODIUM: 141 mmol/L (ref 134–144)
Total Protein: 6.7 g/dL (ref 6.0–8.5)

## 2016-05-21 LAB — CBC
HEMATOCRIT: 40.5 % (ref 37.5–51.0)
HEMOGLOBIN: 13.4 g/dL (ref 13.0–17.7)
MCH: 31.5 pg (ref 26.6–33.0)
MCHC: 33.1 g/dL (ref 31.5–35.7)
MCV: 95 fL (ref 79–97)
Platelets: 245 10*3/uL (ref 150–379)
RBC: 4.25 x10E6/uL (ref 4.14–5.80)
RDW: 13.5 % (ref 12.3–15.4)
WBC: 5.6 10*3/uL (ref 3.4–10.8)

## 2016-05-21 LAB — LIPID PANEL
CHOL/HDL RATIO: 2.9 ratio (ref 0.0–5.0)
Cholesterol, Total: 100 mg/dL (ref 100–199)
HDL: 35 mg/dL — AB (ref >39)
LDL Calculated: 50 mg/dL (ref 0–99)
Triglycerides: 77 mg/dL (ref 0–149)
VLDL Cholesterol Cal: 15 mg/dL (ref 5–40)

## 2016-05-21 LAB — TSH: TSH: 1.36 u[IU]/mL (ref 0.450–4.500)

## 2016-05-21 LAB — HCV AB W/RFLX TO VERIFICATION

## 2016-05-21 LAB — HCV INTERPRETATION

## 2016-05-21 LAB — PSA: Prostate Specific Ag, Serum: 0.8 ng/mL (ref 0.0–4.0)

## 2016-05-27 ENCOUNTER — Other Ambulatory Visit: Payer: Self-pay

## 2016-05-27 MED ORDER — LISINOPRIL 40 MG PO TABS: 40.0000 mg | ORAL_TABLET | Freq: Every day | ORAL | 3 refills | Status: DC

## 2016-05-27 NOTE — Telephone Encounter (Signed)
Recd fax request to chg Lisinopril from Johnny Wilcox to [facility]  sent

## 2016-05-28 ENCOUNTER — Other Ambulatory Visit: Payer: Self-pay

## 2016-05-28 MED ORDER — ATORVASTATIN CALCIUM 80 MG PO TABS: 80.0000 mg | ORAL_TABLET | Freq: Every day | ORAL | 3 refills | Status: DC

## 2016-05-28 NOTE — Telephone Encounter (Signed)
Fax request for atorvastatin - chg from [facility]  sent

## 2016-06-21 ENCOUNTER — Other Ambulatory Visit: Payer: Self-pay | Admitting: Family Medicine

## 2016-06-21 MED ORDER — DOXAZOSIN MESYLATE 4 MG PO TABS: 4.0000 mg | ORAL_TABLET | Freq: Every day | ORAL | 3 refills | Status: DC

## 2016-07-03 ENCOUNTER — Other Ambulatory Visit: Payer: Self-pay

## 2016-07-03 MED ORDER — DOXAZOSIN MESYLATE 4 MG PO TABS: 4.0000 mg | ORAL_TABLET | Freq: Every day | ORAL | 3 refills | Status: DC

## 2016-07-03 NOTE — Telephone Encounter (Signed)
Fax req for [facility] to refill Doxazosin  Sent

## 2016-07-30 ENCOUNTER — Other Ambulatory Visit: Payer: Self-pay | Admitting: Family Medicine

## 2016-09-21 ENCOUNTER — Other Ambulatory Visit: Payer: Self-pay | Admitting: Physician Assistant

## 2016-09-21 DIAGNOSIS — G8929 Other chronic pain: Secondary | ICD-10-CM

## 2016-09-21 DIAGNOSIS — M25561 Pain in right knee: Principal | ICD-10-CM

## 2016-10-04 ENCOUNTER — Ambulatory Visit
Admission: RE | Admit: 2016-10-04 | Discharge: 2016-10-04 | Disposition: A | Discharge status: Home / Self Care | Payer: Worker's Compensation | Source: Ambulatory Visit | Attending: Physician Assistant | Admitting: Physician Assistant

## 2016-10-04 DIAGNOSIS — M25561 Pain in right knee: Principal | ICD-10-CM

## 2016-10-04 DIAGNOSIS — G8929 Other chronic pain: Secondary | ICD-10-CM

## 2017-01-13 ENCOUNTER — Telehealth: Payer: Self-pay | Admitting: *Deleted

## 2017-01-13 NOTE — Telephone Encounter (Signed)
Pt is scheduled on 8/27 with Melina Copa PA-C for surgical clearance, as advised by Dr Meda Coffee.  Pt made aware of appt date and time by [facility] scheduling.

## 2017-01-13 NOTE — Telephone Encounter (Signed)
-----   Message from Jeanie Sewer sent at 01/13/2017  4:07 PM EDT ----- Regarding: RE: Surgical Clearance Contact: [phone] Scheduled 08/27 with Melina Copa pt is aware of date and time.   ----- Message ----- From: Nuala Alpha, LPN Sent: 06/15/2480   3:28 PM To: [facility] Subject: FW: Surgical Clearance                         PLEASE READ BELOW:  This pt needs to be scheduled with a PA/NP for surgical clearance per Dr Meda Coffee.  This is for knee surgery.  Can you please call and arrange?  Thanks,   Karlene Einstein   ----- Message ----- From: Dorothy Spark, MD Sent: 01/13/2017   3:18 PM To: Nuala Alpha, LPN Subject: RE: Surgical Clearance                         I haven't seen this patient in 2 years, please schedule with a PA for a surgical clearance. K ----- Message ----- From: Lacretia Nicks Sent: 01/12/2017   2:46 PM To: Dorothy Spark, MD, Lacretia Nicks Subject: Surgical Clearance                             1. Type of Surgery:  Right Knee Arthroscopy with partial medial menisectomy.  2. Date of Surgery: Pending due to surgical clearance  3. Surgeon: Dr. Berenice Primas  4. Medication that needs to be held & how long: Aspirin  5. Fax/phone: [phone] & [phone]   Please address any medication that needs to be held prior to surgery

## 2017-01-14 ENCOUNTER — Encounter: Payer: Self-pay | Admitting: Physician Assistant

## 2017-01-14 ENCOUNTER — Ambulatory Visit (INDEPENDENT_AMBULATORY_CARE_PROVIDER_SITE_OTHER): Payer: 59 | Admitting: Physician Assistant

## 2017-01-14 VITALS — BP 142/82 | HR 61 | Temp 98.0°F | Resp 18 | Ht 69.69 in | Wt 242.0 lb

## 2017-01-14 DIAGNOSIS — H60392 Other infective otitis externa, left ear: Secondary | ICD-10-CM

## 2017-01-14 MED ORDER — CIPROFLOXACIN-DEXAMETHASONE 0.3-0.1 % OT SUSP: 4.0000 [drp] | Freq: Two times a day (BID) | OTIC | 0 refills | Status: DC

## 2017-01-14 NOTE — Progress Notes (Signed)
    01/14/2017 3:11 PM   DOB: 1951/04/21 / MRN: [id number]  SUBJECTIVE:  Johnny Wilcox is a 66 y.o. male presenting for left ear pain that started 4 days ago and feels like it is stopped up and "it hurts."  Feels that his hearing has changed. No history of diabetes.   He has No Known Allergies.   He  has a past medical history of Cataract; Hypertension; Medical history non-contributory; and Post PTCA (09-21-2012).    He  reports that he has never smoked. His smokeless tobacco use includes Chew. He reports that he drinks alcohol. He reports that he does not use drugs. He  reports that he currently engages in sexual activity. The patient  has a past surgical history that includes Eye surgery; Vasectomy; Multiple extractions with alveoloplasty (12/06/2011); Cataract extraction (Right); Coronary stent placement; left heart catheterization with coronary angiogram (N/A, 09/21/2013); percutaneous coronary stent intervention (pci-s) (09/21/2013); and Colonoscopy.  His family history includes Heart disease in his father and mother; Hypertension in his sister; Stroke in his mother.  Review of Systems  Constitutional: Negative for fever.  HENT: Positive for ear pain and hearing loss. Negative for congestion, ear discharge and tinnitus.   Neurological: Negative for dizziness.    The problem list and medications were reviewed and updated by myself where necessary and exist elsewhere in the encounter.   OBJECTIVE:  BP (!) 142/82   Pulse 61   Temp 98 F (36.7 C) (Oral)   Resp 18   Ht 5' 9.69" (1.77 m)   Wt 242 lb (109.8 kg)   SpO2 98%   BMI 35.04 kg/m   Physical Exam  Constitutional: He is active and cooperative.  HENT:  Ears:  Cardiovascular: Normal rate and regular rhythm.   Pulmonary/Chest: Effort normal. No tachypnea.  Musculoskeletal: Normal range of motion.  Neurological: He is alert.  Skin: Skin is warm.  Vitals reviewed.   No results found for this or any previous visit (from the  past 72 hour(s)).  No results found.  ASSESSMENT AND PLAN:  Finnlee was seen today for ear pain.  Diagnoses and all orders for this visit:  Infective otitis externa of left ear: I can not view his TM and left air conduction less than bone conduction so los threshold to add on PO ABX. If he calls and states he is not better will start him on Amox 875 BID #20 no refills.   -     ciprofloxacin-dexamethasone (CIPRODEX) OTIC suspension; Place 4 drops into the left ear 2 (two) times daily.    The patient is advised to call or return to clinic if he does not see an improvement in symptoms, or to seek the care of the closest emergency department if he worsens with the above plan.   Philis Fendt, MHS, PA-C [facility] 01/14/2017 3:11 PM

## 2017-01-14 NOTE — Patient Instructions (Addendum)
Leave the wick in for 24 hours.  Use the drops as prescribed.      IF you received an x-ray today, you will receive an invoice from [facility]. Please contact [facility] at [phone] with questions or concerns regarding your invoice.   IF you received labwork today, you will receive an invoice from [facility]. Please contact [facility] at [phone] with questions or concerns regarding your invoice.   Our billing staff will not be able to assist you with questions regarding bills from these companies.  You will be contacted with the lab results as soon as they are available. The fastest way to get your results is to activate your My Chart account. Instructions are located on the last page of this paperwork. If you have not heard from [place] regarding the results in 2 weeks, please contact this office.
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
## 2017-02-03 ENCOUNTER — Encounter: Payer: Self-pay | Admitting: Family Medicine

## 2017-02-03 ENCOUNTER — Ambulatory Visit (INDEPENDENT_AMBULATORY_CARE_PROVIDER_SITE_OTHER): Payer: 59 | Admitting: Family Medicine

## 2017-02-03 VITALS — BP 158/89 | HR 51 | Temp 98.2°F | Resp 16 | Ht 70.47 in | Wt 234.0 lb

## 2017-02-03 DIAGNOSIS — H9192 Unspecified hearing loss, left ear: Secondary | ICD-10-CM

## 2017-02-03 DIAGNOSIS — Z1383 Encounter for screening for respiratory disorder NEC: Secondary | ICD-10-CM

## 2017-02-03 DIAGNOSIS — H6123 Impacted cerumen, bilateral: Secondary | ICD-10-CM

## 2017-02-03 DIAGNOSIS — Z125 Encounter for screening for malignant neoplasm of prostate: Secondary | ICD-10-CM

## 2017-02-03 DIAGNOSIS — E7800 Pure hypercholesterolemia, unspecified: Secondary | ICD-10-CM

## 2017-02-03 DIAGNOSIS — Z0000 Encounter for general adult medical examination without abnormal findings: Secondary | ICD-10-CM | POA: Diagnosis not present

## 2017-02-03 DIAGNOSIS — R350 Frequency of micturition: Secondary | ICD-10-CM

## 2017-02-03 DIAGNOSIS — Z130 Encounter for screening for diseases of the blood and blood-forming organs and certain disorders involving the immune mechanism: Secondary | ICD-10-CM | POA: Diagnosis not present

## 2017-02-03 DIAGNOSIS — Z6834 Body mass index (BMI) 34.0-34.9, adult: Secondary | ICD-10-CM | POA: Diagnosis not present

## 2017-02-03 DIAGNOSIS — Z1389 Encounter for screening for other disorder: Secondary | ICD-10-CM

## 2017-02-03 DIAGNOSIS — I10 Essential (primary) hypertension: Secondary | ICD-10-CM | POA: Diagnosis not present

## 2017-02-03 DIAGNOSIS — Z136 Encounter for screening for cardiovascular disorders: Secondary | ICD-10-CM | POA: Diagnosis not present

## 2017-02-03 DIAGNOSIS — N401 Enlarged prostate with lower urinary tract symptoms: Secondary | ICD-10-CM

## 2017-02-03 DIAGNOSIS — I25118 Atherosclerotic heart disease of native coronary artery with other forms of angina pectoris: Secondary | ICD-10-CM

## 2017-02-03 LAB — POCT URINALYSIS DIP (MANUAL ENTRY)
BILIRUBIN UA: NEGATIVE mg/dL
Blood, UA: NEGATIVE
GLUCOSE UA: NEGATIVE mg/dL
LEUKOCYTES UA: NEGATIVE
Nitrite, UA: NEGATIVE
Protein Ur, POC: 30 mg/dL — AB
Spec Grav, UA: 1.025 (ref 1.010–1.025)
Urobilinogen, UA: 0.2 E.U./dL
pH, UA: 5.5 (ref 5.0–8.0)

## 2017-02-03 MED ORDER — LISINOPRIL-HYDROCHLOROTHIAZIDE 20-25 MG PO TABS: 1.0000 | ORAL_TABLET | Freq: Every day | ORAL | 0 refills | Status: DC

## 2017-02-03 NOTE — Patient Instructions (Addendum)
Please call me in 2 months and let me know if your blood pressures have been better since we changed the blood pressure medication and what they have ranged. This way I can decide whether to refill your current medicine or change it to a higher dose. Let me know if you want to try another pill for prostate - to help you more fully empty your bladder.    IF you received an x-ray today, you will receive an invoice from [facility]. Please contact [facility] at [phone] with questions or concerns regarding your invoice.   IF you received labwork today, you will receive an invoice from [place] [facility]. Please contact [facility] at [phone] with questions or concerns regarding your invoice.   Our billing staff will not be able to assist you with questions regarding bills from these companies.  You will be contacted with the lab results as soon as they are available. The fastest way to get your results is to activate your My Chart account. Instructions are located on the last page of this paperwork. If you have not heard from [place] regarding the results in 2 weeks, please contact this office.      Managing Your Hypertension Hypertension is commonly called high blood pressure. This is when the force of your blood pressing against the walls of your arteries is too strong. Arteries are blood vessels that carry blood from your heart throughout your body. Hypertension forces the heart to work harder to pump blood, and may cause the arteries to become narrow or stiff. Having untreated or uncontrolled hypertension can cause heart attack, stroke, kidney disease, and other problems. What are blood pressure readings? A blood pressure reading consists of a higher number over a lower number. Ideally, your blood pressure should be below 120/80. The first ("top") number is called the systolic pressure. It is a measure of the pressure in your arteries as your heart beats. The second ("bottom") number is  called the diastolic pressure. It is a measure of the pressure in your arteries as the heart relaxes. What does my blood pressure reading mean? Blood pressure is classified into four stages. Based on your blood pressure reading, your health care provider may use the following stages to determine what type of treatment you need, if any. Systolic pressure and diastolic pressure are measured in a unit called mm Hg. Normal  Systolic pressure: below 469.  Diastolic pressure: below 80. Elevated  Systolic pressure: 629-528.  Diastolic pressure: below 80. Hypertension stage 1  Systolic pressure: 413-244.  Diastolic pressure: 01-02. Hypertension stage 2  Systolic pressure: 725 or above.  Diastolic pressure: 90 or above. What health risks are associated with hypertension? Managing your hypertension is an important responsibility. Uncontrolled hypertension can lead to:  A heart attack.  A stroke.  A weakened blood vessel (aneurysm).  Heart failure.  Kidney damage.  Eye damage.  Metabolic syndrome.  Memory and concentration problems.  What changes can I make to manage my hypertension? Hypertension can be managed by making lifestyle changes and possibly by taking medicines. Your health care provider will help you make a plan to bring your blood pressure within a normal range. Eating and drinking  Eat a diet that is high in fiber and potassium, and low in salt (sodium), added sugar, and fat. An example eating plan is called the DASH (Dietary Approaches to Stop Hypertension) diet. To eat this way: ? Eat plenty of fresh fruits and vegetables. Try to fill half of your plate at  each meal with fruits and vegetables. ? Eat whole grains, such as whole wheat pasta, brown rice, or whole grain bread. Fill about one quarter of your plate with whole grains. ? Eat low-fat diary products. ? Avoid fatty cuts of meat, processed or cured meats, and poultry with skin. Fill about one quarter of your  plate with lean proteins such as fish, chicken without skin, beans, eggs, and tofu. ? Avoid premade and processed foods. These tend to be higher in sodium, added sugar, and fat.  Reduce your daily sodium intake. Most people with hypertension should eat less than 1,500 mg of sodium a day.  Limit alcohol intake to no more than 1 drink a day for nonpregnant women and 2 drinks a day for men. One drink equals 12 oz of beer, 5 oz of wine, or 1 oz of hard liquor. Lifestyle  Work with your health care provider to maintain a healthy body weight, or to lose weight. Ask what an ideal weight is for you.  Get at least 30 minutes of exercise that causes your heart to beat faster (aerobic exercise) most days of the week. Activities may include walking, swimming, or biking.  Include exercise to strengthen your muscles (resistance exercise), such as weight lifting, as part of your weekly exercise routine. Try to do these types of exercises for 30 minutes at least 3 days a week.  Do not use any products that contain nicotine or tobacco, such as cigarettes and e-cigarettes. If you need help quitting, ask your health care provider.  Control any long-term (chronic) conditions you have, such as high cholesterol or diabetes. Monitoring  Monitor your blood pressure at home as told by your health care provider. Your personal target blood pressure may vary depending on your medical conditions, your age, and other factors.  Have your blood pressure checked regularly, as often as told by your health care provider. Working with your health care provider  Review all the medicines you take with your health care provider because there may be side effects or interactions.  Talk with your health care provider about your diet, exercise habits, and other lifestyle factors that may be contributing to hypertension.  Visit your health care provider regularly. Your health care provider can help you create and adjust your plan  for managing hypertension. Will I need medicine to control my blood pressure? Your health care provider may prescribe medicine if lifestyle changes are not enough to get your blood pressure under control, and if:  Your systolic blood pressure is 130 or higher.  Your diastolic blood pressure is 80 or higher.  Take medicines only as told by your health care provider. Follow the directions carefully. Blood pressure medicines must be taken as prescribed. The medicine does not work as well when you skip doses. Skipping doses also puts you at risk for problems. Contact a health care provider if:  You think you are having a reaction to medicines you have taken.  You have repeated (recurrent) headaches.  You feel dizzy.  You have swelling in your ankles.  You have trouble with your vision. Get help right away if:  You develop a severe headache or confusion.  You have unusual weakness or numbness, or you feel faint.  You have severe pain in your chest or abdomen.  You vomit repeatedly.  You have trouble breathing. Summary  Hypertension is when the force of blood pumping through your arteries is too strong. If this condition is not controlled, it may put  you at risk for serious complications.  Your personal target blood pressure may vary depending on your medical conditions, your age, and other factors. For most people, a normal blood pressure is less than 120/80.  Hypertension is managed by lifestyle changes, medicines, or both. Lifestyle changes include weight loss, eating a healthy, low-sodium diet, exercising more, and limiting alcohol. This information is not intended to replace advice given to you by your health care provider. Make sure you discuss any questions you have with your health care provider. Document Released: 02/23/2012 Document Revised: 04/28/2016 Document Reviewed: 04/28/2016 Elsevier Interactive Patient Education  [employer].

## 2017-02-03 NOTE — Progress Notes (Signed)
   Subjective:    Patient ID: Johnny Wilcox, male    DOB: Feb 09, 1951, 66 y.o.   MRN: [id number]  HPI    Review of Systems     Objective:   Physical Exam        Assessment & Plan:

## 2017-02-04 LAB — COMPREHENSIVE METABOLIC PANEL
ALT: 20 IU/L (ref 0–44)
AST: 24 IU/L (ref 0–40)
Albumin/Globulin Ratio: 2 (ref 1.2–2.2)
Albumin: 4.7 g/dL (ref 3.6–4.8)
Alkaline Phosphatase: 78 IU/L (ref 39–117)
BUN / CREAT RATIO: 17 (ref 10–24)
BUN: 16 mg/dL (ref 8–27)
Bilirubin Total: 0.8 mg/dL (ref 0.0–1.2)
CALCIUM: 9.5 mg/dL (ref 8.6–10.2)
CO2: 21 mmol/L (ref 20–29)
Chloride: 103 mmol/L (ref 96–106)
Creatinine, Ser: 0.96 mg/dL (ref 0.76–1.27)
GFR, EST AFRICAN AMERICAN: 94 mL/min/{1.73_m2} (ref >59)
GFR, EST NON AFRICAN AMERICAN: 81 mL/min/{1.73_m2} (ref >59)
GLUCOSE: 100 mg/dL — AB (ref 65–99)
Globulin, Total: 2.3 g/dL (ref 1.5–4.5)
Potassium: 4.2 mmol/L (ref 3.5–5.2)
Sodium: 142 mmol/L (ref 134–144)
TOTAL PROTEIN: 7 g/dL (ref 6.0–8.5)

## 2017-02-04 LAB — LIPID PANEL
Chol/HDL Ratio: 3 ratio (ref 0.0–5.0)
Cholesterol, Total: 102 mg/dL (ref 100–199)
HDL: 34 mg/dL — AB (ref >39)
LDL Calculated: 48 mg/dL (ref 0–99)
Triglycerides: 99 mg/dL (ref 0–149)
VLDL Cholesterol Cal: 20 mg/dL (ref 5–40)

## 2017-02-04 LAB — CBC
HEMATOCRIT: 39.8 % (ref 37.5–51.0)
Hemoglobin: 13.8 g/dL (ref 13.0–17.7)
MCH: 32 pg (ref 26.6–33.0)
MCHC: 34.7 g/dL (ref 31.5–35.7)
MCV: 92 fL (ref 79–97)
Platelets: 239 10*3/uL (ref 150–379)
RBC: 4.31 x10E6/uL (ref 4.14–5.80)
RDW: 13.5 % (ref 12.3–15.4)
WBC: 4.9 10*3/uL (ref 3.4–10.8)

## 2017-02-04 LAB — PSA: PROSTATE SPECIFIC AG, SERUM: 1 ng/mL (ref 0.0–4.0)

## 2017-02-07 ENCOUNTER — Encounter: Payer: Self-pay | Admitting: Physician Assistant

## 2017-02-07 ENCOUNTER — Ambulatory Visit (INDEPENDENT_AMBULATORY_CARE_PROVIDER_SITE_OTHER): Payer: 59 | Admitting: Physician Assistant

## 2017-02-07 VITALS — BP 124/82 | HR 57 | Ht 70.47 in | Wt 236.0 lb

## 2017-02-07 DIAGNOSIS — Z01810 Encounter for preprocedural cardiovascular examination: Secondary | ICD-10-CM | POA: Diagnosis not present

## 2017-02-07 DIAGNOSIS — I2510 Atherosclerotic heart disease of native coronary artery without angina pectoris: Secondary | ICD-10-CM

## 2017-02-07 DIAGNOSIS — I10 Essential (primary) hypertension: Secondary | ICD-10-CM | POA: Diagnosis not present

## 2017-02-07 DIAGNOSIS — E785 Hyperlipidemia, unspecified: Secondary | ICD-10-CM | POA: Diagnosis not present

## 2017-02-07 NOTE — Progress Notes (Addendum)
Cardiology Office Note    Date:  02/07/2017  ID:  Marvelous, Bouwens [birth year], MRN [id number] PCP:  Shawnee Knapp, MD  Cardiologist: Dr. Meda Coffee   Chief Complaint: pre-operative CV evaluation  History of Present Illness:  Johnny Wilcox is a 66 y.o. male with history of CAD s/p DES to RCA, HTN, hyperlipidemia, ED who presents for pre-op clearance for knee surgery. Last cath was following nuclear stress test in 2015 which showed 30% prox LAD, 90% RCA mid RCA s/p DES, EF 55-65%. Last labs 01/2017 showed LDL 48, HDL 34, Hgb 13.8, Hct 39.8, CMET wnl.   He returns for follow-up feeling great from a cardiac standpoint. He continues to work in the [employer] 80-100 hours a week and does not seem like he wants to slow down. He walks a tremendous amount for work and has not had any recent functional limitation. He's not had any recent CP, arm pain, chest pressure, dyspnea, LEE, orthopnea, PND, dizziness or syncope. He hurt his left knee and per his report has an injured meniscus. He states that orthopedics wants to proceed with an injection for this. We have not received any paperwork yet regarding this.  Past Medical History:  Diagnosis Date  . CAD in native artery    a. abnormal nuc 2015 - s/p DES to RCA, minimal disease in prox LAD, EF 55-65%.  . Cataract    removed from left eye  . ED (erectile dysfunction)   . Hyperlipidemia   . Hypertension     Past Surgical History:  Procedure Laterality Date  . CATARACT EXTRACTION Right   . COLONOSCOPY    . CORONARY STENT PLACEMENT    . EYE SURGERY     child injury-repair.    [name] LEFT HEART CATHETERIZATION WITH CORONARY ANGIOGRAM N/A 09/21/2013   Procedure: LEFT HEART CATHETERIZATION WITH CORONARY ANGIOGRAM;  Surgeon: [name], MD;  Location: [facility] CATH LAB;  Service: Cardiovascular;  Laterality: N/A;  . MULTIPLE EXTRACTIONS WITH ALVEOLOPLASTY  12/06/2011   Procedure: MULTIPLE EXTRACION WITH ALVEOLOPLASTY;  Surgeon: [name], DDS;  Location: [place];  Service: Oral Surgery;  Laterality: Bilateral;  Biopsy tissue right vestibule  . PERCUTANEOUS CORONARY STENT INTERVENTION (PCI-S)  09/21/2013   Procedure: PERCUTANEOUS CORONARY STENT INTERVENTION (PCI-S);  Surgeon: [name], MD;  Location: [facility] CATH LAB;  Service: Cardiovascular;;  . VASECTOMY      Current Medications: Current Meds  Medication Sig  . aspirin 81 MG chewable tablet Chew 1 tablet (81 mg total) by mouth daily.  [name] atorvastatin (LIPITOR) 80 MG tablet Take 1 tablet (80 mg total) by mouth daily.  [name] lisinopril-hydrochlorothiazide (PRINZIDE,ZESTORETIC) 20-25 MG tablet Take 1 tablet by mouth daily.  . tadalafil (CIALIS) 20 MG tablet Take 1 tablet (20 mg total) by mouth daily as needed  for erectile dysfunction.     Allergies:   Patient has no known allergies.   Social History   Social History  . Marital status: Married    Spouse name: N/A  . Number of children: N/A  . Years of education: N/A   Social History Main Topics  . Smoking status: Never Smoker  . Smokeless tobacco: Current User    Types: Chew    Last attempt to quit: 09/06/2011  . Alcohol use Yes     Comment:  maybe 3 beers a month  . Drug use: No  . Sexual activity: Yes   Other Topics Concern  . None   Social  History Narrative  . None     Family History:  Family History  Problem Relation Age of Onset  . Heart disease Father   . Heart disease Mother   . Stroke Mother   . Hypertension Sister   . Colon cancer Neg Hx   . Esophageal cancer Neg Hx   . Rectal cancer Neg Hx   . Stomach cancer Neg Hx    ROS:   Please see the history of present illness.  All other systems are reviewed and otherwise negative.     PHYSICAL EXAM:   VS:  BP 124/82   Pulse (!) 57   Ht 5' 10.47" (1.79 m)   Wt 236 lb (107 kg)   SpO2 97%   BMI 33.41 kg/m   BMI: Body mass index is 33.41 kg/m. GEN: Well nourished, well developed WM, in no acute distress  HEENT: normocephalic, atraumatic Neck: no JVD, carotid  bruits, or masses Cardiac: RRR; no murmurs, rubs, or gallops, no edema  Respiratory:  clear to auscultation bilaterally, normal work of breathing GI: soft, nontender, nondistended, + BS MS: no deformity or atrophy  Skin: warm and dry, no rash Neuro:  Alert and Oriented x 3, Strength and sensation are intact, follows commands Psych: euthymic mood, full affect  Wt Readings from Last 3 Encounters:  02/07/17 236 lb (107 kg)  02/03/17 234 lb (106.1 kg)  01/14/17 242 lb (109.8 kg)      Studies/Labs Reviewed:   EKG:  EKG was ordered today and personally reviewed by me and demonstrates NSR 61 IRBBB, nonspecific TW changes in lead III  Recent Labs: 05/20/2016: TSH 1.360 02/03/2017: ALT 20; BUN 16; Creatinine, Ser 0.96; Hemoglobin 13.8; Platelets 239; Potassium 4.2; Sodium 142   Lipid Panel    Component Value Date/Time   CHOL 102 02/03/2017 0955   TRIG 99 02/03/2017 0955   HDL 34 (L) 02/03/2017 0955   CHOLHDL 3.0 02/03/2017 0955   CHOLHDL 3 01/14/2015 0901   VLDL 21.6 01/14/2015 0901   LDLCALC 48 02/03/2017 0955    Additional studies/ records that were reviewed today include: Summarized above    ASSESSMENT & PLAN:   1. Pre-operative CV evaluation - per patient, going for injection into the meniscus of his knee. EKG appears similar to prior and he has not had any progressive anginal symptoms. I believe he is at acceptable risk to proceed with this without further CV testing at this time. He is not sure which physician referred him. He will find out when he gets home and will call us so a copy of this note can be routed to them for their review. ADDENDUM 02/08/2017: received updated surgical clearance information from surgeon with clarification on procedure - patient is scheduled to undergo Right Knee Arthroscopy with partial medial menisectomy. Based on functional capacity, lack of anginal symptoms and no recent EKG changes, I still feel he is acceptable to proceed. Timing of cessation  of aspirin per surgery, but would hold for no longer than necessary prior to procedure and resume as soon as safe after procedure. He should notify our office if he has any interim changes between now and surgery. 2. CAD - doing well. No recent anginal symptoms. Continue ASA, statin. Baseline HR in the 50s precludes BB. 3. Essential Hypertension - controlled. Tolerating meds well without difficulty. Recent potassium and creatinine were normal. 4. Hyperlipidemia - recent LDL controlled. Discussed healthy diet/continued regular physical activity for means to increase HDL. Continue present regimen.  Disposition: F/u  with Dr. Meda Coffee in 1 year.   Medication Adjustments/Labs and Tests Ordered: Current medicines are reviewed at length with the patient today.  Concerns regarding medicines are outlined above. Medication changes, Labs and Tests ordered today are summarized above and listed in the Patient Instructions accessible in Encounters.   Signed, Charlie Pitter, PA-C  02/07/2017 3:49 PM    [facility] [address], [place], [place]  [address] Phone: [phone]; Fax: [phone]

## 2017-02-07 NOTE — Patient Instructions (Addendum)
Medication Instructions:  Your physician recommends that you continue on your current medications as directed. Please refer to the Current Medication list given to you today.   Labwork: None ordered  Testing/Procedures: None ordered  Follow-Up: Your physician wants you to follow-up in: 1 year with Dr. Nelson. You will receive a reminder letter in the mail two months in advance. If you don't receive a letter, please call our office to schedule the follow-up appointment.   Any Other Special Instructions Will Be Listed Below (If Applicable).     If you need a refill on your cardiac medications before your next appointment, please call your pharmacy.   

## 2017-02-08 ENCOUNTER — Telehealth: Payer: Self-pay | Admitting: Family Medicine

## 2017-02-08 ENCOUNTER — Telehealth: Payer: Self-pay | Admitting: *Deleted

## 2017-02-08 MED ORDER — TADALAFIL 20 MG PO TABS: ORAL_TABLET | ORAL | 4 refills | Status: DC

## 2017-02-08 NOTE — Telephone Encounter (Signed)
Follow Up:    Please call,concerning pt's surgical clearance.Pt is scheduled for surgery tomorrow.

## 2017-02-08 NOTE — Telephone Encounter (Signed)
Sent to [place] [facility].

## 2017-02-08 NOTE — Telephone Encounter (Signed)
Please advise. I seen that you sent 15 tablets with 3 refills back in December. Okay to refill ?

## 2017-02-08 NOTE — Telephone Encounter (Signed)
Pt requesting Rx  Refill for Cialis   Best phone for pt is [phone]   Pharmacy mail order [facility]

## 2017-02-08 NOTE — Telephone Encounter (Signed)
Spoke with patient's wife and advised that rx that was requested was sent into pharmacy.

## 2017-02-08 NOTE — Telephone Encounter (Signed)
Received a call back from [place] re: pt. Elmyra Ricks sent a message to me and Melina Copa, PA-C and she will take care of this.

## 2017-02-08 NOTE — Telephone Encounter (Signed)
Pt walked into the office this morning requesting a paper from [place] to be faxed Dr. Berenice Primas. I left a message for him to call back.

## 2017-02-11 ENCOUNTER — Other Ambulatory Visit: Payer: Self-pay | Admitting: Orthopedic Surgery

## 2017-02-16 ENCOUNTER — Encounter (HOSPITAL_COMMUNITY): Payer: Self-pay | Admitting: *Deleted

## 2017-02-18 ENCOUNTER — Ambulatory Visit (HOSPITAL_COMMUNITY): Payer: Worker's Compensation | Admitting: Registered Nurse

## 2017-02-18 ENCOUNTER — Encounter (HOSPITAL_COMMUNITY): Payer: Self-pay | Admitting: *Deleted

## 2017-02-18 ENCOUNTER — Ambulatory Visit (HOSPITAL_COMMUNITY)
Admission: RE | Admit: 2017-02-18 | Discharge: 2017-02-18 | Disposition: A | Discharge status: Home / Self Care | Payer: Worker's Compensation | Source: Ambulatory Visit | Attending: Orthopedic Surgery | Admitting: Orthopedic Surgery

## 2017-02-18 ENCOUNTER — Encounter (HOSPITAL_COMMUNITY)
Admission: RE | Disposition: A | Discharge status: Home / Self Care | Payer: Self-pay | Source: Ambulatory Visit | Attending: Orthopedic Surgery

## 2017-02-18 DIAGNOSIS — M94261 Chondromalacia, right knee: Secondary | ICD-10-CM | POA: Diagnosis present

## 2017-02-18 DIAGNOSIS — E785 Hyperlipidemia, unspecified: Secondary | ICD-10-CM | POA: Diagnosis not present

## 2017-02-18 DIAGNOSIS — I10 Essential (primary) hypertension: Secondary | ICD-10-CM | POA: Diagnosis not present

## 2017-02-18 DIAGNOSIS — Z6832 Body mass index (BMI) 32.0-32.9, adult: Secondary | ICD-10-CM | POA: Diagnosis not present

## 2017-02-18 DIAGNOSIS — M23231 Derangement of other medial meniscus due to old tear or injury, right knee: Secondary | ICD-10-CM | POA: Diagnosis not present

## 2017-02-18 DIAGNOSIS — Z8249 Family history of ischemic heart disease and other diseases of the circulatory system: Secondary | ICD-10-CM | POA: Insufficient documentation

## 2017-02-18 DIAGNOSIS — M6751 Plica syndrome, right knee: Secondary | ICD-10-CM | POA: Diagnosis not present

## 2017-02-18 DIAGNOSIS — F17220 Nicotine dependence, chewing tobacco, uncomplicated: Secondary | ICD-10-CM | POA: Insufficient documentation

## 2017-02-18 DIAGNOSIS — S83241A Other tear of medial meniscus, current injury, right knee, initial encounter: Secondary | ICD-10-CM | POA: Diagnosis present

## 2017-02-18 DIAGNOSIS — R001 Bradycardia, unspecified: Secondary | ICD-10-CM | POA: Diagnosis not present

## 2017-02-18 DIAGNOSIS — Z9841 Cataract extraction status, right eye: Secondary | ICD-10-CM | POA: Diagnosis not present

## 2017-02-18 DIAGNOSIS — E669 Obesity, unspecified: Secondary | ICD-10-CM | POA: Insufficient documentation

## 2017-02-18 DIAGNOSIS — Z955 Presence of coronary angioplasty implant and graft: Secondary | ICD-10-CM | POA: Diagnosis not present

## 2017-02-18 DIAGNOSIS — I2510 Atherosclerotic heart disease of native coronary artery without angina pectoris: Secondary | ICD-10-CM | POA: Insufficient documentation

## 2017-02-18 DIAGNOSIS — Z823 Family history of stroke: Secondary | ICD-10-CM | POA: Insufficient documentation

## 2017-02-18 HISTORY — PX: KNEE ARTHROSCOPY: SHX127

## 2017-02-18 SURGERY — ARTHROSCOPY, KNEE
Anesthesia: General | Site: Knee | Laterality: Right

## 2017-02-18 MED ORDER — FENTANYL CITRATE (PF) 100 MCG/2ML IJ SOLN: 25.0000 ug | INTRAMUSCULAR | Status: DC | PRN

## 2017-02-18 MED ORDER — BUPIVACAINE HCL (PF) 0.5 % IJ SOLN
INTRAMUSCULAR | Status: DC | PRN
  Administered 2017-02-18: 20 mL

## 2017-02-18 MED ORDER — HYDROCODONE-ACETAMINOPHEN 5-325 MG PO TABS: 1.0000 | ORAL_TABLET | Freq: Once | ORAL | Status: DC

## 2017-02-18 MED ORDER — ONDANSETRON HCL 4 MG/2ML IJ SOLN
INTRAMUSCULAR | Status: AC
  Filled 2017-02-18: qty 2

## 2017-02-18 MED ORDER — CHLORHEXIDINE GLUCONATE 4 % EX LIQD: 60.0000 mL | Freq: Once | CUTANEOUS | Status: DC

## 2017-02-18 MED ORDER — PROMETHAZINE HCL 25 MG/ML IJ SOLN: 6.2500 mg | INTRAMUSCULAR | Status: DC | PRN

## 2017-02-18 MED ORDER — HYDROCODONE-ACETAMINOPHEN 5-325 MG PO TABS: 1.0000 | ORAL_TABLET | Freq: Four times a day (QID) | ORAL | 0 refills | Status: DC | PRN

## 2017-02-18 MED ORDER — EPHEDRINE SULFATE-NACL 50-0.9 MG/10ML-% IV SOSY
PREFILLED_SYRINGE | INTRAVENOUS | Status: DC | PRN
  Administered 2017-02-18: 5 mg via INTRAVENOUS

## 2017-02-18 MED ORDER — BUPIVACAINE HCL (PF) 0.5 % IJ SOLN
INTRAMUSCULAR | Status: AC
  Filled 2017-02-18: qty 30

## 2017-02-18 MED ORDER — LACTATED RINGERS IV SOLN
INTRAVENOUS | Status: DC
  Administered 2017-02-18: 09:00:00 via INTRAVENOUS

## 2017-02-18 MED ORDER — POVIDONE-IODINE 10 % EX SWAB: 2.0000 "application " | Freq: Once | CUTANEOUS | Status: DC

## 2017-02-18 MED ORDER — ONDANSETRON HCL 4 MG/2ML IJ SOLN
INTRAMUSCULAR | Status: DC | PRN
  Administered 2017-02-18: 4 mg via INTRAVENOUS

## 2017-02-18 MED ORDER — STERILE WATER FOR IRRIGATION IR SOLN
Status: DC | PRN
  Administered 2017-02-18: 500 mL

## 2017-02-18 MED ORDER — MIDAZOLAM HCL 5 MG/5ML IJ SOLN
INTRAMUSCULAR | Status: DC | PRN
  Administered 2017-02-18: 2 mg via INTRAVENOUS

## 2017-02-18 MED ORDER — LIDOCAINE 2% (20 MG/ML) 5 ML SYRINGE
INTRAMUSCULAR | Status: AC
  Filled 2017-02-18: qty 5

## 2017-02-18 MED ORDER — PROPOFOL 10 MG/ML IV BOLUS
INTRAVENOUS | Status: DC | PRN
  Administered 2017-02-18: 200 mg via INTRAVENOUS

## 2017-02-18 MED ORDER — EPHEDRINE 5 MG/ML INJ
INTRAVENOUS | Status: AC
  Filled 2017-02-18: qty 10

## 2017-02-18 MED ORDER — CEFAZOLIN SODIUM-DEXTROSE 2-4 GM/100ML-% IV SOLN
2.0000 g | INTRAVENOUS | Status: AC
  Administered 2017-02-18: 2 g via INTRAVENOUS
  Filled 2017-02-18: qty 100

## 2017-02-18 MED ORDER — FENTANYL CITRATE (PF) 100 MCG/2ML IJ SOLN
INTRAMUSCULAR | Status: DC | PRN
  Administered 2017-02-18: 50 ug via INTRAVENOUS
  Administered 2017-02-18: 5 ug via INTRAVENOUS

## 2017-02-18 MED ORDER — LACTATED RINGERS IR SOLN
Status: DC | PRN
  Administered 2017-02-18: 6000 mL

## 2017-02-18 MED ORDER — MIDAZOLAM HCL 2 MG/2ML IJ SOLN
INTRAMUSCULAR | Status: AC
  Filled 2017-02-18: qty 2

## 2017-02-18 MED ORDER — FENTANYL CITRATE (PF) 250 MCG/5ML IJ SOLN
INTRAMUSCULAR | Status: AC
  Filled 2017-02-18: qty 5

## 2017-02-18 MED ORDER — LIDOCAINE 2% (20 MG/ML) 5 ML SYRINGE
INTRAMUSCULAR | Status: DC | PRN
  Administered 2017-02-18: 100 mg via INTRAVENOUS

## 2017-02-18 MED ORDER — PROPOFOL 10 MG/ML IV BOLUS
INTRAVENOUS | Status: AC
  Filled 2017-02-18: qty 20

## 2017-02-18 SURGICAL SUPPLY — 23 items
BANDAGE ACE 6X5 VEL STRL LF (GAUZE/BANDAGES/DRESSINGS) ×3 IMPLANT
BLADE 4.2CUDA (BLADE) ×5 IMPLANT
BLADE SURG SZ11 CARB STEEL (BLADE) ×2 IMPLANT
BNDG GAUZE ELAST 4 BULKY (GAUZE/BANDAGES/DRESSINGS) ×3 IMPLANT
BOOTIES KNEE HIGH SLOAN (MISCELLANEOUS) ×3 IMPLANT
COVER SURGICAL LIGHT HANDLE (MISCELLANEOUS) ×3 IMPLANT
DRAPE SHEET LG 3/4 BI-LAMINATE (DRAPES) ×3 IMPLANT
DRSG EMULSION OIL 3X3 NADH (GAUZE/BANDAGES/DRESSINGS) ×3 IMPLANT
DRSG PAD ABDOMINAL 8X10 ST (GAUZE/BANDAGES/DRESSINGS) ×3 IMPLANT
DURAPREP 26ML APPLICATOR (WOUND CARE) ×3 IMPLANT
GAUZE SPONGE 4X4 12PLY STRL (GAUZE/BANDAGES/DRESSINGS) ×3 IMPLANT
GLOVE ECLIPSE 7.5 STRL STRAW (GLOVE) ×6 IMPLANT
GOWN STRL REUS W/TWL XL LVL3 (GOWN DISPOSABLE) ×4 IMPLANT
KIT BASIN OR (CUSTOM PROCEDURE TRAY) ×2 IMPLANT
MANIFOLD NEPTUNE II (INSTRUMENTS) ×6 IMPLANT
PACK ARTHROSCOPY WL (CUSTOM PROCEDURE TRAY) ×3 IMPLANT
PACK ICE MAXI GEL EZY WRAP (MISCELLANEOUS) ×9 IMPLANT
PAD ABD 8X10 STRL (GAUZE/BANDAGES/DRESSINGS) ×2 IMPLANT
PADDING CAST COTTON 6X4 STRL (CAST SUPPLIES) ×3 IMPLANT
POSITIONER SURGICAL ARM (MISCELLANEOUS) ×6 IMPLANT
SUT ETHILON 4 0 PS 2 18 (SUTURE) ×3 IMPLANT
TUBING ARTHRO INFLOW-ONLY STRL (TUBING) ×3 IMPLANT
WRAP KNEE MAXI GEL POST OP (GAUZE/BANDAGES/DRESSINGS) ×3 IMPLANT

## 2017-02-18 NOTE — Anesthesia Preprocedure Evaluation (Addendum)
Anesthesia Evaluation  Patient identified by MRN, date of birth, ID band Patient awake    Reviewed: Allergy & Precautions, H&P , NPO status , Patient's Chart, lab work & pertinent test results  Airway Mallampati: II   Neck ROM: Full    Dental  (+) Edentulous Upper, Edentulous Lower   Pulmonary neg pulmonary ROS,    breath sounds clear to auscultation       Cardiovascular Exercise Tolerance: Good hypertension, + CAD and + Cardiac Stents   Rhythm:Regular Rate:Bradycardia  DES to RCA 2015   Neuro/Psych negative neurological ROS  negative psych ROS   GI/Hepatic negative GI ROS, Neg liver ROS,   Endo/Other  Obesity  Renal/GU negative Renal ROS  negative genitourinary   Musculoskeletal negative musculoskeletal ROS (+)   Abdominal   Peds  Hematology negative hematology ROS (+)   Anesthesia Other Findings   Reproductive/Obstetrics                            Anesthesia Physical  Anesthesia Plan  ASA: III  Anesthesia Plan: General   Post-op Pain Management:    Induction: Intravenous  PONV Risk Score and Plan: 2 and Ondansetron, Dexamethasone and Treatment may vary due to age or medical condition  Airway Management Planned: LMA  Additional Equipment: None  Intra-op Plan:   Post-operative Plan: Extubation in OR  Informed Consent: I have reviewed the patients History and Physical, chart, labs and discussed the procedure including the risks, benefits and alternatives for the proposed anesthesia with the patient or authorized representative who has indicated his/her understanding and acceptance.   Dental advisory given  Plan Discussed with: CRNA  Anesthesia Plan Comments:         Anesthesia Quick Evaluation

## 2017-02-18 NOTE — Transfer of Care (Signed)
Immediate Anesthesia Transfer of Care Note  Patient: Johnny Wilcox  Procedure(s) Performed: Procedure(s): ARTHROSCOPY RIGHT KNEE WITH PARTIAL MEDIAL MENISECTOMY, MEDIAL CHONDROPLASTY AND PATELLA, AND MEDIAL PLICO EXCISION (Right)  Patient Location: PACU  Anesthesia Type:General  Level of Consciousness: awake, alert , oriented and patient cooperative  Airway & Oxygen Therapy: Patient Spontanous Breathing and Patient connected to face mask oxygen  Post-op Assessment: Report given to RN, Post -op Vital signs reviewed and stable and Patient moving all extremities  Post vital signs: Reviewed and stable  Last Vitals:  Vitals:   02/18/17 0835  BP: (!) 137/92  Pulse: (!) 42  Resp: 12  Temp: 36.7 C  SpO2: 99%    Last Pain:  Vitals:   02/18/17 0835  TempSrc: Oral      Patients Stated Pain Goal: 4 (56/43/32 9518)  Complications: No apparent anesthesia complications

## 2017-02-18 NOTE — Progress Notes (Signed)
Pt states his heart rate is always low. Presents with skepticism regarding surgical process, emotional support and reassurance provided.

## 2017-02-18 NOTE — Progress Notes (Signed)
PACU note-----Dr. Fransisco Beau, anesthesiologist, notified pt's heart rate 36-42, sinus brady; SAO2 100, BP 117/77, resp 12; pt drowsy but easily arousable, oriented; states heart rate "OK, he was like that preop; as long as not symptomatic"; no orders rec'd; pt OK to go to short stay when meets PACU discharge criteria

## 2017-02-18 NOTE — Anesthesia Postprocedure Evaluation (Signed)
Anesthesia Post Note  Patient: Johnny Wilcox  Procedure(s) Performed: Procedure(s) (LRB): ARTHROSCOPY RIGHT KNEE WITH PARTIAL MEDIAL MENISECTOMY, MEDIAL CHONDROPLASTY AND PATELLA, AND MEDIAL PLICO EXCISION (Right)     Patient location during evaluation: PACU Anesthesia Type: General Level of consciousness: awake and alert Pain management: pain level controlled Vital Signs Assessment: post-procedure vital signs reviewed and stable Respiratory status: spontaneous breathing, nonlabored ventilation and respiratory function stable Cardiovascular status: blood pressure returned to baseline and stable Postop Assessment: no signs of nausea or vomiting Anesthetic complications: no    Last Vitals:  Vitals:   02/18/17 1115 02/18/17 1130  BP: 117/77 120/72  Pulse: (!) 38 (!) 37  Resp: 12 13  Temp:  (!) 36.3 C  SpO2: 100% 100%    Last Pain:  Vitals:   02/18/17 1115  TempSrc:   PainSc: 0-No pain                 Audry Pili

## 2017-02-18 NOTE — H&P (Signed)
PREOPERATIVE H&P  Chief Complaint: painful right knee  HPI: Johnny Wilcox is a 66 y.o. male who presents for evaluation of painful right knee. It has been present for greater than 3 months and has been worsening. He has failed conservative measures. Pain is rated as moderate.  Past Medical History:  Diagnosis Date  . CAD in native artery    a. abnormal nuc 2015 - s/p DES to RCA, minimal disease in prox LAD, EF 55-65%.  . Cataract    removed from left eye  . ED (erectile dysfunction)   . Hyperlipidemia   . Hypertension    Past Surgical History:  Procedure Laterality Date  . CATARACT EXTRACTION Right   . COLONOSCOPY    . CORONARY STENT PLACEMENT    . EYE SURGERY     child injury-repair.    [name] LEFT HEART CATHETERIZATION WITH CORONARY ANGIOGRAM N/A 09/21/2013   Procedure: LEFT HEART CATHETERIZATION WITH CORONARY ANGIOGRAM;  Surgeon: [name], MD;  Location: [facility] CATH LAB;  Service: Cardiovascular;  Laterality: N/A;  . MULTIPLE EXTRACTIONS WITH ALVEOLOPLASTY  12/06/2011   Procedure: MULTIPLE EXTRACION WITH ALVEOLOPLASTY;  Surgeon: [name], DDS;  Location: [place];  Service: Oral Surgery;  Laterality: Bilateral;  Biopsy tissue right vestibule  . PERCUTANEOUS CORONARY STENT INTERVENTION (PCI-S)  09/21/2013   Procedure: PERCUTANEOUS CORONARY STENT INTERVENTION (PCI-S);  Surgeon: [name], MD;  Location: [facility] CATH LAB;  Service: Cardiovascular;;  . VASECTOMY     Social History   Social History  . Marital status: Married    Spouse name: N/A  . Number of children: N/A  . Years of education: N/A   Social History Main Topics  . Smoking status: Never Smoker  . Smokeless tobacco: Current User    Types: Chew    Last attempt to quit: 09/06/2011  . Alcohol use Yes     Comment:  maybe 3 beers a month  . Drug use: No  . Sexual activity: Yes   Other Topics Concern  . Not on file   Social History Narrative  . No narrative on file   Family History  Problem Relation Age of  Onset  . Heart disease Father   . Heart disease Mother   . Stroke Mother   . Hypertension Sister   . Colon cancer Neg Hx   . Esophageal cancer Neg Hx   . Rectal cancer Neg Hx   . Stomach cancer Neg Hx    No Known Allergies Prior to Admission medications   Medication Sig Start Date End Date Taking? Authorizing Provider  aspirin EC 81 MG tablet Take 81 mg by mouth daily.   Yes [provider]  atorvastatin (LIPITOR) 80 MG tablet Take 1 tablet (80 mg total) by mouth daily. 05/28/16  Yes Shawnee Knapp, MD  lisinopril-hydrochlorothiazide (PRINZIDE,ZESTORETIC) 20-25 MG tablet Take 1 tablet by mouth daily. 02/03/17  Yes Shawnee Knapp, MD  tadalafil (CIALIS) 20 MG tablet Take 1 tablet (20 mg total) by mouth daily as needed  for erectile dysfunction. 02/08/17  Yes Shawnee Knapp, MD     Positive ROS: none  All other systems have been reviewed and were otherwise negative with the exception of those mentioned in the HPI and as above.  Physical Exam: There were no vitals filed for this visit.  General: Alert, no acute distress Cardiovascular: No pedal edema Respiratory: No cyanosis, no use of accessory musculature GI: No organomegaly, abdomen is soft and non-tender Skin: No lesions  in the area of chief complaint Neurologic: Sensation intact distally Psychiatric: Patient is competent for consent with normal mood and affect Lymphatic: No axillary or cervical lymphadenopathy  MUSCULOSKELETAL: right knee:Positive McMurray. Negative instability. Trace effusion.Painful range of motion.  MRI: MRI shows medial meniscal tear complex in nature. Assessment/Plan: RIGHT KNEE MEDIAL MENISCUS TEAR Plan for Procedure(s): ARTHROSCOPY RIGHT KNEE  The risks benefits and alternatives were discussed with the patient including but not limited to the risks of nonoperative treatment, versus surgical intervention including infection, bleeding, nerve injury, malunion, nonunion, hardware prominence, hardware  failure, need for hardware removal, blood clots, cardiopulmonary complications, morbidity, mortality, among others, and they were willing to proceed.  Predicted outcome is good, although there will be at least a six to nine month expected recovery.  Jacqueline Delapena L, MD 02/18/2017 7:13 AM

## 2017-02-18 NOTE — Discharge Instructions (Addendum)
POST-OP KNEE ARTHROSCOPY INSTRUCTIONS  Dr. Alain Marion PA-C  Pain You will be expected to have a moderate amount of pain in the affected knee for approximately two weeks. However, the first two days will be the most severe pain. A prescription has been provided to take as needed for the pain. The pain can be reduced by applying ice packs to the knee for the first 1-2 weeks post surgery. Also, keeping the leg elevated on pillows will help alleviate the pain. If you develop any acute pain or swelling in your calf muscle, please call the doctor.  Activity It is preferred that you stay at bed rest for approximately 24 hours. However, you may go to the bathroom with help. Weight bearing as tolerated. You may begin the knee exercises the day of surgery. Discontinue crutches as the knee pain resolves.  Dressing Keep the dressing dry. If the ace bandage should wrinkle or roll up, this can be rewrapped to prevent ridges in the bandage. You may remove all dressings in 48 hours,  apply bandaids to each wound. You may shower on the 4th day after surgery but no tub bath.  Symptoms to report to your doctor Extreme pain Extreme swelling Temperature above 101 degrees Change in the feeling, color, or movement of your toes Redness, heat, or swelling at your incision  Exercise If is preferred that as soon as possible you try to do a straight leg raise without bending the knee and concentrate on bringing the heel of your foot off the bed up to approximately 45 degrees and hold for the count of 10 seconds. Repeat this at least 10 times three or four times per day. Additional exercises are provided below.  You are encouraged to bend the knee as tolerated.  Follow-Up Call to schedule a follow-up appointment in 5-7 days. Our office # is [phone].  POST-OP EXERCISES  Short Arc Quads  1. Lie on back with legs straight. Place towel roll under thigh, just above knee. 2. Tighten thigh muscles to  straighten knee and lift heel off bed. 3. Hold for slow count of five, then lower. 4. Do three sets of ten    Straight Leg Raises  1. Lie on back with operative leg straight and other leg bent. 2. Keeping operative leg completely straight, slowly lift operative leg so foot is 5 inches off bed. 3. Hold for slow count of five, then lower. 4. Do three sets of ten.    DO BOTH EXERCISES 2 TIMES A DAY  Ankle Pumps  Work/move the operative ankle and foot up and down 10 times every hour while awake.   General Anesthesia, Adult, Care After These instructions provide you with information about caring for yourself after your procedure. Your health care provider may also give you more specific instructions. Your treatment has been planned according to current medical practices, but problems sometimes occur. Call your health care provider if you have any problems or questions after your procedure. What can I expect after the procedure? After the procedure, it is common to have:  Vomiting.  A sore throat.  Mental slowness.  It is common to feel:  Nauseous.  Cold or shivery.  Sleepy.  Tired.  Sore or achy, even in parts of your body where you did not have surgery.  Follow these instructions at home: For at least 24 hours after the procedure:  Do not: ? Participate in activities where you could fall or become injured. ? Drive. ? Use heavy  machinery. ? Drink alcohol. ? Take sleeping pills or medicines that cause drowsiness. ? Make important decisions or sign legal documents. ? Take care of children on your own.  Rest. Eating and drinking  If you vomit, drink water, juice, or soup when you can drink without vomiting.  Drink enough fluid to keep your urine clear or pale yellow.  Make sure you have little or no nausea before eating solid foods.  Follow the diet recommended by your health care provider. General instructions  Have a responsible adult stay with you until  you are awake and alert.  Return to your normal activities as told by your health care provider. Ask your health care provider what activities are safe for you.  Take over-the-counter and prescription medicines only as told by your health care provider.  If you smoke, do not smoke without supervision.  Keep all follow-up visits as told by your health care provider. This is important. Contact a health care provider if:  You continue to have nausea or vomiting at home, and medicines are not helpful.  You cannot drink fluids or start eating again.  You cannot urinate after 8-12 hours.  You develop a skin rash.  You have fever.  You have increasing redness at the site of your procedure. Get help right away if:  You have difficulty breathing.  You have chest pain.  You have unexpected bleeding.  You feel that you are having a life-threatening or urgent problem. This information is not intended to replace advice given to you by your health care provider. Make sure you discuss any questions you have with your health care provider. Document Released: 09/06/2000 Document Revised: 11/03/2015 Document Reviewed: 05/15/2015 Elsevier Interactive Patient Education  [employer].

## 2017-02-18 NOTE — Brief Op Note (Signed)
02/18/2017  10:51 AM  PATIENT:  Johnny Wilcox  66 y.o. male  PRE-OPERATIVE DIAGNOSIS:  RIGHT KNEE MEDIAL MENISCUS TEAR  POST-OPERATIVE DIAGNOSIS:  RIGHT KNEE MEDIAL MENISCUS TEAR  PROCEDURE:  Procedure(s): ARTHROSCOPY RIGHT KNEE WITH PARTIAL MEDIAL MENISECTOMY, MEDIAL CHONDROPLASTY AND PATELLA, AND MEDIAL PLICO EXCISION (Right)  SURGEON:  Surgeon(s) and Role:    [name], MD - Primary  PHYSICIAN ASSISTANT:   ASSISTANTS: bethune   ANESTHESIA:   general  EBL:  Total I/O In: 1000 [I.V.:1000] Out: 15 [Blood:15]  BLOOD ADMINISTERED:none  DRAINS: none   LOCAL MEDICATIONS USED:  MARCAINE     SPECIMEN:  No Specimen  DISPOSITION OF SPECIMEN:  N/A  COUNTS:  YES  TOURNIQUET:  * No tourniquets in log *  DICTATION: .Other Dictation: Dictation Number [id number]  PLAN OF CARE: Discharge to home after PACU  PATIENT DISPOSITION:  PACU - hemodynamically stable.   Delay start of Pharmacological VTE agent (>24hrs) due to surgical blood loss or risk of bleeding: no

## 2017-02-18 NOTE — Anesthesia Procedure Notes (Signed)
Procedure Name: LMA Insertion Date/Time: 02/18/2017 10:09 AM Performed by: Carleene Cooper A Pre-anesthesia Checklist: Patient identified, Emergency Drugs available, Suction available, Patient being monitored and Timeout performed Patient Re-evaluated:Patient Re-evaluated prior to induction Oxygen Delivery Method: Circle system utilized Preoxygenation: Pre-oxygenation with 100% oxygen Induction Type: IV induction Ventilation: Mask ventilation without difficulty LMA: LMA with gastric port inserted LMA Size: 4.0 Number of attempts: 1 Placement Confirmation: positive ETCO2 and breath sounds checked- equal and bilateral Tube secured with: Tape Dental Injury: Teeth and Oropharynx as per pre-operative assessment

## 2017-02-19 NOTE — Op Note (Signed)
NAMELEMONT, SITZMANN NO.:  [id number]  MEDICAL RECORD NO.:  [id number]  LOCATION:  PERIO                        FACILITY:  [facility]  PHYSICIAN:  Alta Corning, M.D.   DATE OF BIRTH:  October 03, 1950  DATE OF PROCEDURE:  02/18/2017 DATE OF DISCHARGE:                              OPERATIVE REPORT   PREOPERATIVE DIAGNOSIS:  Medial meniscal tear, right knee.  POSTOPERATIVE DIAGNOSES: 1. Medial meniscal tear, right knee. 2. Chondromalacia of medial femoral condyle and patellofemoral joint. 3. Medial shelf plica.  PROCEDURES: 1. Partial medial meniscectomy, right knee. 2. Chondroplasty, medial femoral condyle and patellofemoral joint,     right knee. 3. Excision of medial shelf plica, right knee.  SURGEON:  Alta Corning, M.D.  ASSISTANT:  Gary Fleet, P.A.  ANESTHESIA:  General.  BRIEF HISTORY:  Mr. Lamere is a 66 year old male with long history of significant complaints of right knee pain.  He had been treated conservatively for prolonged period of time.  MRI was obtained, which showed medial meniscal tear, and after failure of conservative care, he was taken to the operating room for operative knee arthroscopy.  DESCRIPTION OF PROCEDURE:  The patient was taken to the operating room. After adequate anesthesia was obtained with general anesthetic, the patient was placed supine on the operating table.  The right leg was prepped and draped in usual sterile fashion.  Following this, routine arthroscopic examination of the knee revealed there was chondromalacia of the patellofemoral joint, was debrided back to a smooth and stable rim and down to bleeding bone were necessary.  Attention was then turned to the medial compartment where the medial plica was excised to allow access into the medial compartment.  There was a significant medial meniscal tear, which was debrided back to a smooth and stable rim, and the medial femoral condyle was debrided back to a smooth  and stable rim of articular cartilage.  Once this was done, the ACL was examined, noted to be normal.  Lateral side was normal.  The knee was copiously and thoroughly irrigated, and suctioned dry.  The arthroscopic portals were closed with the bandage.  A 20 mL of 0.25% Marcaine was instilled in the knee for postoperative anesthesia.  A sterile compressive dressing was applied and the patient was taken to the recovery room and was noted to be in satisfactory condition.  Estimated blood loss for the procedure was minimal.     Alta Corning, M.D.     Corliss Skains  D:  02/18/2017  T:  02/19/2017  Job:  [id number]

## 2017-03-29 ENCOUNTER — Other Ambulatory Visit: Payer: Self-pay | Admitting: Family Medicine

## 2017-05-21 ENCOUNTER — Other Ambulatory Visit: Payer: Self-pay | Admitting: Family Medicine

## 2017-05-23 ENCOUNTER — Ambulatory Visit: Payer: 59 | Admitting: Family Medicine

## 2017-05-28 ENCOUNTER — Ambulatory Visit: Payer: 59 | Admitting: Family Medicine

## 2017-06-04 ENCOUNTER — Encounter: Payer: Self-pay | Admitting: Family Medicine

## 2017-06-04 ENCOUNTER — Ambulatory Visit: Payer: 59 | Admitting: Family Medicine

## 2017-06-04 ENCOUNTER — Other Ambulatory Visit: Payer: Self-pay

## 2017-06-04 VITALS — BP 128/68 | HR 57 | Temp 98.7°F | Resp 16 | Ht 70.0 in | Wt 239.4 lb

## 2017-06-04 DIAGNOSIS — R5382 Chronic fatigue, unspecified: Secondary | ICD-10-CM | POA: Diagnosis not present

## 2017-06-04 DIAGNOSIS — N529 Male erectile dysfunction, unspecified: Secondary | ICD-10-CM

## 2017-06-04 DIAGNOSIS — I10 Essential (primary) hypertension: Secondary | ICD-10-CM

## 2017-06-04 DIAGNOSIS — E782 Mixed hyperlipidemia: Secondary | ICD-10-CM

## 2017-06-04 DIAGNOSIS — R809 Proteinuria, unspecified: Secondary | ICD-10-CM | POA: Diagnosis not present

## 2017-06-04 DIAGNOSIS — Z5181 Encounter for therapeutic drug level monitoring: Secondary | ICD-10-CM | POA: Diagnosis not present

## 2017-06-04 DIAGNOSIS — Z23 Encounter for immunization: Secondary | ICD-10-CM

## 2017-06-04 DIAGNOSIS — R7301 Impaired fasting glucose: Secondary | ICD-10-CM | POA: Diagnosis not present

## 2017-06-04 LAB — POCT URINALYSIS DIP (MANUAL ENTRY)
BILIRUBIN UA: NEGATIVE
BILIRUBIN UA: NEGATIVE mg/dL
Blood, UA: NEGATIVE
GLUCOSE UA: NEGATIVE mg/dL
Leukocytes, UA: NEGATIVE
Nitrite, UA: NEGATIVE
Protein Ur, POC: NEGATIVE mg/dL
SPEC GRAV UA: 1.02 (ref 1.010–1.025)
Urobilinogen, UA: 0.2 E.U./dL
pH, UA: 5.5 (ref 5.0–8.0)

## 2017-06-04 MED ORDER — LISINOPRIL-HYDROCHLOROTHIAZIDE 20-25 MG PO TABS: 1.0000 | ORAL_TABLET | Freq: Every day | ORAL | 0 refills | Status: DC

## 2017-06-04 MED ORDER — ATORVASTATIN CALCIUM 80 MG PO TABS: 80.0000 mg | ORAL_TABLET | Freq: Every day | ORAL | 3 refills | Status: DC

## 2017-06-04 MED ORDER — AVANAFIL 200 MG PO TABS: 200.0000 mg | ORAL_TABLET | Freq: Once | ORAL | 2 refills | Status: AC

## 2017-06-04 MED ORDER — VARDENAFIL HCL 20 MG PO TABS: 20.0000 mg | ORAL_TABLET | Freq: Every day | ORAL | 2 refills | Status: DC | PRN

## 2017-06-04 NOTE — Progress Notes (Signed)
Subjective:  By signing my name below, I, Johnny Wilcox, attest that this documentation has been prepared under the direction and in the presence of Delman Cheadle, MD. Electronically Signed: Moises Wilcox, [place]. 06/04/2017 , 1:47 PM .  Patient was seen in Room 1 .   Patient ID: Johnny Wilcox, male    DOB: May 06, 1951, 66 y.o.   MRN: [id number] Chief Complaint  Patient presents with  . Follow-up    labwork    HPI Johnny Wilcox is a 66 y.o. male who presents to Primary Care at [facility] for follow up. He was last seen 4 months ago for his physical. He has a past medical history of HTN, hyperlipidemia, CAD, BPH, ED, and OA. He's due for repeat pneumovax today as last prior was 03/22/12, prior to age 74. He had Prevnar administered at 66 years old. His BP not controlled on lisinopril 40mg , so changed to lisinopril-HCTZ 20-25mg . Lipids at goal with LDL of 48 and non-HDL 68.   He states having frequent urination during the day, but rare nocturia. He also mentions feeling more fatigue lately. He checks his BP occasionally. He had ran out of the combination pill, Lisinopril-HCTZ and went back to taking lisinopril 40mg , but wasn't able to check his BP, as at the time, he was in [place].   He received flu vaccine on Nov 5th.  He agrees to pneumovax today.   He states cialis isn't working for him. He's tried Viagra in the past too without benefit.   Past Medical History:  Diagnosis Date  . CAD in native artery    a. abnormal nuc 2015 - s/p DES to RCA, minimal disease in prox LAD, EF 55-65%.  . Cataract    removed from left eye  . ED (erectile dysfunction)   . Hyperlipidemia   . Hypertension    Past Surgical History:  Procedure Laterality Date  . CATARACT EXTRACTION Right   . COLONOSCOPY    . CORONARY STENT PLACEMENT    . EYE SURGERY     child injury-repair.    [name] KNEE ARTHROSCOPY Right 02/18/2017   Procedure: ARTHROSCOPY RIGHT KNEE WITH PARTIAL MEDIAL MENISECTOMY, MEDIAL CHONDROPLASTY AND  PATELLA, AND MEDIAL PLICO EXCISION;  Surgeon: [name], MD;  Location: [facility];  Service: Orthopedics;  Laterality: Right;  . LEFT HEART CATHETERIZATION WITH CORONARY ANGIOGRAM N/A 09/21/2013   Procedure: LEFT HEART CATHETERIZATION WITH CORONARY ANGIOGRAM;  Surgeon: [name], MD;  Location: [facility] CATH LAB;  Service: Cardiovascular;  Laterality: N/A;  . MULTIPLE EXTRACTIONS WITH ALVEOLOPLASTY  12/06/2011   Procedure: MULTIPLE EXTRACION WITH ALVEOLOPLASTY;  Surgeon: [name], DDS;  Location: [place];  Service: Oral Surgery;  Laterality: Bilateral;  Biopsy tissue right vestibule  . PERCUTANEOUS CORONARY STENT INTERVENTION (PCI-S)  09/21/2013   Procedure: PERCUTANEOUS CORONARY STENT INTERVENTION (PCI-S);  Surgeon: [name], MD;  Location: [facility] CATH LAB;  Service: Cardiovascular;;  . VASECTOMY     Prior to Admission medications   Medication Sig Start Date End Date Taking? Authorizing Provider  aspirin EC 81 MG tablet Take 81 mg by mouth daily.    [provider]  atorvastatin (LIPITOR) 80 MG tablet TAKE 1 TABLET BY MOUTH  DAILY 05/22/17   Shawnee Knapp, MD  HYDROcodone-acetaminophen (NORCO) 5-325 MG tablet Take 1-2 tablets by mouth every 6 (six) hours as needed for moderate pain. 02/18/17   Gary Fleet, PA-C  lisinopril-hydrochlorothiazide (PRINZIDE,ZESTORETIC) 20-25 MG tablet TAKE 1 TABLET BY MOUTH  DAILY 03/30/17  Shawnee Knapp, MD  tadalafil (CIALIS) 20 MG tablet Take 1 tablet (20 mg total) by mouth daily as needed  for erectile dysfunction. 02/08/17   Shawnee Knapp, MD   No Known Allergies Family History  Problem Relation Age of Onset  . Heart disease Father   . Heart disease Mother   . Stroke Mother   . Hypertension Sister   . Colon cancer Neg Hx   . Esophageal cancer Neg Hx   . Rectal cancer Neg Hx   . Stomach cancer Neg Hx    Social History   Socioeconomic History  . Marital status: Married    Spouse name: None  . Number of children: None  . Years of education: None   . Highest education level: None  Social Needs  . Financial resource strain: None  . Food insecurity - worry: None  . Food insecurity - inability: None  . Transportation needs - medical: None  . Transportation needs - non-medical: None  Occupational History  . None  Tobacco Use  . Smoking status: Never Smoker  . Smokeless tobacco: Current User    Types: Chew  Substance and Sexual Activity  . Alcohol use: Yes    Comment:  maybe 3 beers a month  . Drug use: No  . Sexual activity: Yes  Other Topics Concern  . None  Social History Narrative  . None   Depression screen [facility] 2/9 06/04/2017 02/03/2017 02/03/2017 01/14/2017 05/20/2016  Decreased Interest 0 0 0 0 0  Down, Depressed, Hopeless 0 0 0 0 0  PHQ - 2 Score 0 0 0 0 0    Review of Systems  Constitutional: Positive for fatigue. Negative for unexpected weight change.  Eyes: Negative for visual disturbance.  Respiratory: Negative for cough, chest tightness and shortness of breath.   Cardiovascular: Negative for chest pain, palpitations and leg swelling.  Gastrointestinal: Negative for abdominal pain and Wilcox in stool.  Genitourinary: Positive for frequency.  Neurological: Negative for dizziness, light-headedness and headaches.       Objective:   Physical Exam  Constitutional: He is oriented to person, place, and time. He appears well-developed and well-nourished. No distress.  HENT:  Head: Normocephalic and atraumatic.  Eyes: EOM are normal. Pupils are equal, round, and reactive to light.  Neck: Neck supple.  Cardiovascular: Normal rate.  Pulmonary/Chest: Effort normal. No respiratory distress.  Musculoskeletal: Normal range of motion.  Neurological: He is alert and oriented to person, place, and time.  Skin: Skin is warm and dry.  Psychiatric: He has a normal mood and affect. His behavior is normal.  Nursing note and vitals reviewed.   BP 128/68   Pulse (!) 57   Temp 98.7 F (37.1 C)   Resp 16   Ht 5\' 10"  (1.778  m)   Wt 239 lb 6.4 oz (108.6 kg)   SpO2 96%   BMI 34.35 kg/m      Assessment & Plan:   1. Mixed hyperlipidemia - cont lipitor 80. Lipids at goal  2. Medication monitoring encounter   3. Proteinuria, unspecified type   4. Essential hypertension -- cont lisinopril-hctz 20-25  5. Vasculogenic erectile dysfunction, unspecified vasculogenic erectile dysfunction type - has failed cialis and viagra.  Avanafil has a significantly faster onset of action so will try that.  If ineffective -will need urology referral to discuss alternative trxs.    6. Chronic fatigue   7. Elevated fasting glucose    Prefers VERY direct paternalistic medical care (just  tell him what to do).  Orders Placed This Encounter  Procedures  . Pneumococcal polysaccharide vaccine 23-valent greater than or equal to 2yo subcutaneous/IM  . Comprehensive metabolic panel    Order Specific Question:   Has the patient fasted?    Answer:   Yes  . Lipid panel    Order Specific Question:   Has the patient fasted?    Answer:   Yes  . TestT+TestF+SHBG  . TSH  . Hemoglobin A1c  . Hemoglobin A1c  . POCT urinalysis dipstick  . POCT Microscopic Urinalysis ([facility])    Meds ordered this encounter  Medications  . atorvastatin (LIPITOR) 80 MG tablet    Sig: Take 1 tablet (80 mg total) by mouth daily.    Dispense:  90 tablet    Refill:  3  . lisinopril-hydrochlorothiazide (PRINZIDE,ZESTORETIC) 20-25 MG tablet    Sig: Take 1 tablet by mouth daily.    Dispense:  90 tablet    Refill:  0  . DISCONTD: vardenafil (LEVITRA) 20 MG tablet    Sig: Take 1 tablet (20 mg total) by mouth daily as needed for erectile dysfunction.    Dispense:  10 tablet    Refill:  2    D/C Rx for CialisFailed Cialis and Viagra  . Avanafil 200 MG TABS    Sig: Take 200 mg by mouth once for 1 dose. Daily 15 minutes before sexual activity on empty stomach or after low fat meal best    Dispense:  30 tablet    Refill:  2    D/C prior rx for cialis and Levitra  (vardenafil) Failed cialis and viagra    I personally performed the services described in this documentation, which was scribed in my presence. The recorded information has been reviewed and considered, and addended by me as needed.   Delman Cheadle, M.D.  Primary Care at [facility] [address] [place], [place] [address] [phone] phone [phone] fax  06/11/17 10:55 PM

## 2017-06-04 NOTE — Patient Instructions (Addendum)
IF you received an x-ray today, you will receive an invoice from [facility]. Please contact [facility] at [phone] with questions or concerns regarding your invoice.   IF you received labwork today, you will receive an invoice from [place]. Please contact [facility] at [phone] with questions or concerns regarding your invoice.   Our billing staff will not be able to assist you with questions regarding bills from these companies.  You will be contacted with the lab results as soon as they are available. The fastest way to get your results is to activate your My Chart account. Instructions are located on the last page of this paperwork. If you have not heard from [place] regarding the results in 2 weeks, please contact this office.      Erectile Dysfunction Erectile dysfunction (ED) is the inability to get or keep an erection in order to have sexual intercourse. Erectile dysfunction may include:  Inability to get an erection.  Lack of enough hardness of the erection to allow penetration.  Loss of the erection before sex is finished.  What are the causes? This condition may be caused by:  Certain medicines, such as: ? Pain relievers. ? Antihistamines. ? Antidepressants. ? Blood pressure medicines. ? Water pills (diuretics). ? Ulcer medicines. ? Muscle relaxants. ? Drugs.  Excessive drinking.  Psychological causes, such as: ? Anxiety. ? Depression. ? Sadness. ? Exhaustion. ? Performance fear. ? Stress.  Physical causes, such as: ? Artery problems. This may include diabetes, smoking, liver disease, or atherosclerosis. ? High blood pressure. ? Hormonal problems, such as low testosterone. ? Obesity. ? Nerve problems. This may include back or pelvic injuries, diabetes mellitus, multiple sclerosis, or Parkinson disease.  What are the signs or symptoms? Symptoms of this condition include:  Inability to get an erection.  Lack of enough  hardness of the erection to allow penetration.  Loss of the erection before sex is finished.  Normal erections at some times, but with frequent unsatisfactory episodes.  Low sexual satisfaction in either partner due to erection problems.  A curved penis occurring with erection. The curve may cause pain or the penis may be too curved to allow for intercourse.  Never having nighttime erections.  How is this diagnosed? This condition is often diagnosed by:  Performing a physical exam to find other diseases or specific problems with the penis.  Asking you detailed questions about the problem.  Performing blood tests to check for diabetes mellitus or to measure hormone levels.  Performing other tests to check for underlying health conditions.  Performing an ultrasound exam to check for scarring.  Performing a test to check blood flow to the penis.  Doing a sleep study at home to measure nighttime erections.  How is this treated? This condition may be treated by:  Medicine taken by mouth to help you achieve an erection (oral medicine).  Hormone replacement therapy to replace low testosterone levels.  Medicine that is injected into the penis. Your health care provider may instruct you how to give yourself these injections at home.  Vacuum pump. This is a pump with a ring on it. The pump and ring are placed on the penis and used to create pressure that helps the penis become erect.  Penile implant surgery. In this procedure, you may receive: ? An inflatable implant. This consists of cylinders, a pump, and a reservoir. The cylinders can be inflated with a fluid that helps to create an erection, and they can be  deflated after intercourse. ? A semi-rigid implant. This consists of two silicone rubber rods. The rods provide some rigidity. They are also flexible, so the penis can both curve downward in its normal position and become straight for sexual intercourse.  Blood vessel  surgery, to improve blood flow to the penis. During this procedure, a blood vessel from a different part of the body is placed into the penis to allow blood to flow around (bypass) damaged or blocked blood vessels.  Lifestyle changes, such as exercising more, losing weight, and quitting smoking.  Follow these instructions at home: Medicines  Take over-the-counter and prescription medicines only as told by your health care provider. Do not increase the dosage without first discussing it with your health care provider.  If you are using self-injections, perform injections as directed by your health care provider. Make sure to avoid any veins that are on the surface of the penis. After giving an injection, apply pressure to the injection site for 5 minutes. General instructions  Exercise regularly, as directed by your health care provider. Work with your health care provider to lose weight, if needed.  Do not use any products that contain nicotine or tobacco, such as cigarettes and e-cigarettes. If you need help quitting, ask your health care provider.  Before using a vacuum pump, read the instructions that come with the pump and discuss any questions with your health care provider.  Keep all follow-up visits as told by your health care provider. This is important. Contact a health care provider if:  You feel nauseous.  You vomit. Get help right away if:  You are taking oral or injectable medicines and you have an erection that lasts longer than 4 hours. If your health care provider is unavailable, go to the nearest emergency room for evaluation. An erection that lasts much longer than 4 hours can result in permanent damage to your penis.  You have severe pain in your groin or abdomen.  You develop redness or severe swelling of your penis.  You have redness spreading up into your groin or lower abdomen.  You are unable to urinate.  You experience chest pain or a rapid heart beat  (palpitations) after taking oral medicines. Summary  Erectile dysfunction (ED) is the inability to get or keep an erection during sexual intercourse. This problem can usually be treated successfully.  This condition is diagnosed based on a physical exam, your symptoms, and tests to determine the cause. Treatment varies depending on the cause, and may include medicines, hormone therapy, surgery, or vacuum pump.  You may need follow-up visits to make sure that you are using your medicines or devices correctly.  Get help right away if you are taking or injecting medicines and you have an erection that lasts longer than 4 hours. This information is not intended to replace advice given to you by your health care provider. Make sure you discuss any questions you have with your health care provider. Document Released: 05/28/2000 Document Revised: 06/16/2016 Document Reviewed: 06/16/2016 Elsevier Interactive Patient Education  2017 [employer].

## 2017-06-05 LAB — COMPREHENSIVE METABOLIC PANEL
A/G RATIO: 1.8 (ref 1.2–2.2)
ALBUMIN: 4.5 g/dL (ref 3.6–4.8)
ALT: 22 IU/L (ref 0–44)
AST: 21 IU/L (ref 0–40)
Alkaline Phosphatase: 81 IU/L (ref 39–117)
BILIRUBIN TOTAL: 0.5 mg/dL (ref 0.0–1.2)
BUN / CREAT RATIO: 18 (ref 10–24)
BUN: 19 mg/dL (ref 8–27)
CHLORIDE: 100 mmol/L (ref 96–106)
CO2: 25 mmol/L (ref 20–29)
Calcium: 9.5 mg/dL (ref 8.6–10.2)
Creatinine, Ser: 1.03 mg/dL (ref 0.76–1.27)
GFR calc non Af Amer: 75 mL/min/{1.73_m2} (ref >59)
GFR, EST AFRICAN AMERICAN: 86 mL/min/{1.73_m2} (ref >59)
Globulin, Total: 2.5 g/dL (ref 1.5–4.5)
Glucose: 106 mg/dL — ABNORMAL HIGH (ref 65–99)
POTASSIUM: 4.1 mmol/L (ref 3.5–5.2)
Sodium: 139 mmol/L (ref 134–144)
TOTAL PROTEIN: 7 g/dL (ref 6.0–8.5)

## 2017-06-05 LAB — LIPID PANEL
Chol/HDL Ratio: 2.9 ratio (ref 0.0–5.0)
Cholesterol, Total: 116 mg/dL (ref 100–199)
HDL: 40 mg/dL (ref >39)
LDL Calculated: 58 mg/dL (ref 0–99)
Triglycerides: 90 mg/dL (ref 0–149)
VLDL CHOLESTEROL CAL: 18 mg/dL (ref 5–40)

## 2017-06-09 LAB — TSH: TSH: 2.21 u[IU]/mL (ref 0.450–4.500)

## 2017-06-09 LAB — HEMOGLOBIN A1C
ESTIMATED AVERAGE GLUCOSE: 123 mg/dL
HEMOGLOBIN A1C: 5.9 % — AB (ref 4.8–5.6)

## 2017-06-09 LAB — TESTT+TESTF+SHBG
Sex Hormone Binding: 64.1 nmol/L (ref 19.3–76.4)
TESTOSTERONE FREE: 7.6 pg/mL (ref 6.6–18.1)
TESTOSTERONE, TOTAL: 546 ng/dL (ref 264.0–916.0)

## 2017-06-13 DIAGNOSIS — Z720 Tobacco use: Secondary | ICD-10-CM | POA: Insufficient documentation

## 2017-06-13 DIAGNOSIS — H9012 Conductive hearing loss, unilateral, left ear, with unrestricted hearing on the contralateral side: Secondary | ICD-10-CM | POA: Insufficient documentation

## 2017-06-13 DIAGNOSIS — H6123 Impacted cerumen, bilateral: Secondary | ICD-10-CM | POA: Insufficient documentation

## 2017-08-11 ENCOUNTER — Ambulatory Visit: Payer: 59 | Admitting: Family Medicine

## 2017-08-11 ENCOUNTER — Other Ambulatory Visit: Payer: Self-pay

## 2017-08-11 ENCOUNTER — Ambulatory Visit (INDEPENDENT_AMBULATORY_CARE_PROVIDER_SITE_OTHER): Payer: 59

## 2017-08-11 ENCOUNTER — Encounter: Payer: Self-pay | Admitting: Family Medicine

## 2017-08-11 VITALS — BP 114/76 | HR 49 | Temp 97.7°F | Resp 18 | Ht 70.0 in | Wt 235.8 lb

## 2017-08-11 DIAGNOSIS — N401 Enlarged prostate with lower urinary tract symptoms: Secondary | ICD-10-CM | POA: Diagnosis not present

## 2017-08-11 DIAGNOSIS — M7918 Myalgia, other site: Secondary | ICD-10-CM | POA: Diagnosis not present

## 2017-08-11 DIAGNOSIS — I10 Essential (primary) hypertension: Secondary | ICD-10-CM | POA: Diagnosis not present

## 2017-08-11 DIAGNOSIS — N529 Male erectile dysfunction, unspecified: Secondary | ICD-10-CM

## 2017-08-11 DIAGNOSIS — E782 Mixed hyperlipidemia: Secondary | ICD-10-CM | POA: Diagnosis not present

## 2017-08-11 DIAGNOSIS — N139 Obstructive and reflux uropathy, unspecified: Secondary | ICD-10-CM | POA: Diagnosis not present

## 2017-08-11 MED ORDER — SILODOSIN 8 MG PO CAPS: 8.0000 mg | ORAL_CAPSULE | Freq: Every day | ORAL | 3 refills | Status: DC

## 2017-08-11 MED ORDER — SILODOSIN 8 MG PO CAPS: 8.0000 mg | ORAL_CAPSULE | Freq: Every day | ORAL | 0 refills | Status: DC

## 2017-08-11 NOTE — Progress Notes (Signed)
Subjective:  By signing my name below, I, Johnny Wilcox, attest that this documentation has been prepared under the direction and in the presence of Johnny Cheadle, MD Electronically Signed: Ladene Artist, ED Scribe 08/11/2017 at 8:23 AM.   Patient ID: Johnny Wilcox, male    DOB: 03-10-1951, 67 y.o.   MRN: [id number]  Chief Complaint  Patient presents with  . Hypertension    pt states BP have been "fair". Pt states he does check BPs at home. Pt gives examples of 131/72.  Johnny Wilcox Hyperlipidemia  . Follow-up   HPI  Johnny Wilcox is a 67 y.o. male who presents to Primary Care at [facility] for f/u. Saw pt 2 months ago; had fasting labs done at that time.   HTN Pt has been checking his BP at home with readings of 130s/70s. Denies cp/pressure, chest tightness, any other symptoms on exertion.   ED Viagra and cialis didn't really work for him. However, he did report heartburn with stendra so he discontinued the medication.   BPH with LUTS Pt has tried doxazosin last yr with side-effects of fatigue. Has never tried any other prostate medication but urine sxs are worsening so is getting to the point where he is ready to see urology to discuss options.  Cerumen Impaction Pt saw Dr. Constance Holster for cerumen impaction removal on L ear.  Was put under strict instructions to put NOTHING into that ear again including ear wax softener drops and only return to ENT for vacuum cerumen extraction only - pt not sure why.  L Hip Pain S/p Fall Pt reports a fall that occurred 1 wk before Christmas. He reports tripping, falling forward and injuring his L hip pain (points to the base of his left glut at the most posterior aspect which is directly over ischial tuberosity). He reports persistent L hip pain which is exacerbated with sitting and certain movements. Also reports falling down stairs on his buttocks 2 days later.  Past Medical History:  Diagnosis Date  . CAD in native artery    a. abnormal nuc 2015 - s/p DES to RCA,  minimal disease in prox LAD, EF 55-65%.  . Cataract    removed from left eye  . ED (erectile dysfunction)   . Hyperlipidemia   . Hypertension    Current Outpatient Medications on File Prior to Visit  Medication Sig Dispense Refill  . aspirin EC 81 MG tablet Take 81 mg by mouth daily.    Johnny Wilcox atorvastatin (LIPITOR) 80 MG tablet Take 1 tablet (80 mg total) by mouth daily. 90 tablet 3  . lisinopril-hydrochlorothiazide (PRINZIDE,ZESTORETIC) 20-25 MG tablet Take 1 tablet by mouth daily. 90 tablet 0   No current facility-administered medications on file prior to visit.    No Known Allergies   Past Surgical History:  Procedure Laterality Date  . CATARACT EXTRACTION Right   . COLONOSCOPY    . CORONARY STENT PLACEMENT    . EYE SURGERY     child injury-repair.    Johnny Wilcox KNEE ARTHROSCOPY Right 02/18/2017   Procedure: ARTHROSCOPY RIGHT KNEE WITH PARTIAL MEDIAL MENISECTOMY, MEDIAL CHONDROPLASTY AND PATELLA, AND MEDIAL PLICO EXCISION;  Surgeon: [name], MD;  Location: [facility];  Service: Orthopedics;  Laterality: Right;  . LEFT HEART CATHETERIZATION WITH CORONARY ANGIOGRAM N/A 09/21/2013   Procedure: LEFT HEART CATHETERIZATION WITH CORONARY ANGIOGRAM;  Surgeon: [name], MD;  Location: [facility] CATH LAB;  Service: Cardiovascular;  Laterality: N/A;  . MULTIPLE EXTRACTIONS WITH ALVEOLOPLASTY  12/06/2011  Procedure: MULTIPLE EXTRACION WITH ALVEOLOPLASTY;  Surgeon: [name], DDS;  Location: [place];  Service: Oral Surgery;  Laterality: Bilateral;  Biopsy tissue right vestibule  . PERCUTANEOUS CORONARY STENT INTERVENTION (PCI-S)  09/21/2013   Procedure: PERCUTANEOUS CORONARY STENT INTERVENTION (PCI-S);  Surgeon: [name], MD;  Location: [facility] CATH LAB;  Service: Cardiovascular;;  . VASECTOMY     Family History  Problem Relation Age of Onset  . Heart disease Father   . Heart disease Mother   . Stroke Mother   . Hypertension Sister   . Colon cancer Neg Hx   . Esophageal cancer Neg Hx   . Rectal  cancer Neg Hx   . Stomach cancer Neg Hx    Social History   Socioeconomic History  . Marital status: Married    Spouse name: None  . Number of children: None  . Years of education: None  . Highest education level: None  Social Needs  . Financial resource strain: None  . Food insecurity - worry: None  . Food insecurity - inability: None  . Transportation needs - medical: None  . Transportation needs - non-medical: None  Occupational History  . None  Tobacco Use  . Smoking status: Never Smoker  . Smokeless tobacco: Current User    Types: Chew  Substance and Sexual Activity  . Alcohol use: Yes    Comment:  maybe 3 beers a month  . Drug use: No  . Sexual activity: Yes  Other Topics Concern  . None  Social History Narrative  . None   Depression screen [facility] 2/9 08/11/2017 06/04/2017 02/03/2017 02/03/2017 01/14/2017  Decreased Interest 0 0 0 0 0  Down, Depressed, Hopeless 0 0 0 0 0  PHQ - 2 Score 0 0 0 0 0    Review of Systems  Respiratory: Negative for chest tightness.   Cardiovascular: Negative for chest pain.  Musculoskeletal: Positive for arthralgias.      Objective:   Physical Exam  Constitutional: He is oriented to person, place, and time. He appears well-developed and well-nourished. No distress.  HENT:  Head: Normocephalic and atraumatic.  Eyes: Conjunctivae and EOM are normal.  Neck: Neck supple. No tracheal deviation present.  Cardiovascular: Normal rate.  Pulmonary/Chest: Effort normal. No respiratory distress.  Musculoskeletal: Normal range of motion.  Neurological: He is alert and oriented to person, place, and time.  Skin: Skin is warm and dry.  Psychiatric: He has a normal mood and affect. His behavior is normal.  Nursing note and vitals reviewed.  BP 114/76 (BP Location: Left Arm, Patient Position: Sitting, Cuff Size: Normal)   Pulse (!) 49   Temp 97.7 F (36.5 C) (Oral)   Resp 18   Ht 5\' 10"  (1.778 m)   Wt 235 lb 12.8 oz (107 kg)   SpO2 99%    BMI 33.83 kg/m     Dg Hip Unilat W Or W/o Pelvis 2-3 Views Left  Result Date: 08/11/2017 CLINICAL DATA:  Fall.  Pain over ischial tuberosity. EXAM: DG HIP (WITH OR WITHOUT PELVIS) 2-3V LEFT COMPARISON:  No recent prior. FINDINGS: No acute bony or joint abnormality identified. No evidence of fracture dislocation. Degenerative changes lumbar spine and both hips. Pelvic calcifications consistent phleboliths. Calcific density noted over the soft tissues of the left hip appears corticated is most likely related old injury or phlebolith. IMPRESSION: No acute bony or joint abnormality identified. No evidence of acute fracture or dislocation. Electronically Signed   By: Marcello Moores  Register  On: 08/11/2017 08:48   Assessment & Plan:   1. Left buttock pain - pt mentioned at end of visit when requested xray - suspect low gluteal strain, rest, ice, watchful waiting - if pain cont after several weeks, call for referral to PT or ortho  2. Benign localized hyperplasia of prostate with urinary obstruction and lower urinary tract symptoms - worsening sxs so refer to urology, in the meantime, trial of alpha-blocker. Only other med trial for sxs prior was doxazosin which he couldn't tolerate due to fatigue  3. Erectile dysfunction, unspecified erectile dysfunction type - failed numerous PDE inhibitors - refer to urology to discuss other poss treatments  4. Essential hypertension   5. Mixed hyperlipidemia     Orders Placed This Encounter  Procedures  . DG HIP UNILAT W OR W/O PELVIS 2-3 VIEWS LEFT    Standing Status:   Future    Number of Occurrences:   1    Standing Expiration Date:   08/11/2018    Order Specific Question:   Reason for Exam (SYMPTOM  OR DIAGNOSIS REQUIRED)    Answer:   tripped and fell onto left buttock 2 mos ago wiht continued pain over ischial tuberosity    Order Specific Question:   Preferred imaging location?    Answer:   External  . Ambulatory referral to Urology    Referral Priority:    Routine    Referral Type:   Consultation    Referral Reason:   Specialty Services Required    Requested Specialty:   Urology    Number of Visits Requested:   1    Meds ordered this encounter  Medications  . DISCONTD: silodosin (RAPAFLO) 8 MG CAPS capsule    Sig: Take 1 capsule (8 mg total) by mouth daily with breakfast.    Dispense:  30 capsule    Refill:  3  . silodosin (RAPAFLO) 8 MG CAPS capsule    Sig: Take 1 capsule (8 mg total) by mouth daily with breakfast.    Dispense:  90 capsule    Refill:  0    I personally performed the services described in this documentation, which was scribed in my presence. The recorded information has been reviewed and considered, and addended by me as needed.   Johnny Wilcox, M.D.  Primary Care at [facility] [address] [place], [place] [address] [phone] phone [phone] fax  08/17/17 4:36 AM

## 2017-08-11 NOTE — Patient Instructions (Addendum)
Dg Hip Unilat W Or W/o Pelvis 2-3 Views Left  Result Date: 08/11/2017 CLINICAL DATA:  Fall.  Pain over ischial tuberosity. EXAM: DG HIP (WITH OR WITHOUT PELVIS) 2-3V LEFT COMPARISON:  No recent prior. FINDINGS: No acute bony or joint abnormality identified. No evidence of fracture dislocation. Degenerative changes lumbar spine and both hips. Pelvic calcifications consistent phleboliths. Calcific density noted over the soft tissues of the left hip appears corticated is most likely related old injury or phlebolith. IMPRESSION: No acute bony or joint abnormality identified. No evidence of acute fracture or dislocation. Electronically Signed   By: Marcello Moores  Register   On: 08/11/2017 08:48    IF you received an x-ray today, you will receive an invoice from [facility]. Please contact [facility] at [phone] with questions or concerns regarding your invoice.   IF you received labwork today, you will receive an invoice from [place]. Please contact [facility] at [phone] with questions or concerns regarding your invoice.   Our billing staff will not be able to assist you with questions regarding bills from these companies.  You will be contacted with the lab results as soon as they are available. The fastest way to get your results is to activate your My Chart account. Instructions are located on the last page of this paperwork. If you have not heard from [place] regarding the results in 2 weeks, please contact this office.     Gluteal Strain A gluteal strain happens when the muscles in the buttocks (gluteal muscles) are overstretched or torn. A tear can be partial or complete. A gluteal strain can cause pain and stiffness in your buttocks, legs, and lower back. A strain might be referred to as "pulling a muscle." The severity of a gluteal strain is rated in degrees. First-degree strains have the least amount of muscle tearing and pain. Second-degree and third-degrees strains have  increasingly more tearing and pain. What are the causes? There are many possible causes of gluteal strain, such as:  Stretching the muscles too far.  Putting too much stress on the muscles before they are warmed up.  Overusing the muscles.  Repetitive muscle movements over long periods of time.  Injury.  What increases the risk? This condition is more likely to develop in:  People who are in cold weather.  People who are physically tired.  People with poor strength and flexibility.  People who do not warm up properly before physical activity.  People who do exercises or play sports with sudden bursts of activity.  People who have poor exercise technique.  What are the signs or symptoms? Symptoms of this condition include:  Pain in the buttocks, especially when moving the legs. Pain may spread to the lower back or the legs.  Bruising and swelling of the buttocks.  Tenderness, weakness, or stiffness in the buttocks.  Muscle spasms.  How is this diagnosed? This condition is diagnosed based on a physical exam and medical history. Your health care provider may do some range of motion exercises with you. You may have tests, such as MRI or X-rays. Your strain may be rated based on how severe it is. The ratings are:  Grade 1 strain (mild). Your muscles are overstretched. You might have very small muscle tears. This type of strain generally heals in about 1 week.  Grade 2 strain (moderate). Your muscles are partially torn. This may take 1 to 2 months to heal.  Grade 3 strain (severe). Your muscles are completely torn. A severe strain  can take more than three months to heal. Grade 3 gluteal strains are rare.  How is this treated? Treatment for this condition includes resting, icing, and raising (elevating) the injured area as much as possible. Your health care provider may recommend over-the-counter pain medicines. If your gluteal strain is severe or very painful, your health  care provider may prescribe pain medicines or physical therapy. Surgery for severe strains is rare. Follow these instructions at home:  Take over-the-counter and prescription medicines only as told by your health care provider.  Do not drive or operate heavy machinery while taking prescription pain medicine.  Return to your normal activities as told by your health care provider. Ask your health care provider what activities are safe for you. ? Rest your gluteal muscles as much as possible, especially for the first 2-3 days. ? Begin exercising or stretching as told by your health care provider.  If directed, apply ice to the injured area: ? Put ice in a plastic bag. ? Place a towel between your skin and the bag. ? Leave the ice on for 20 minutes, 2-3 times per day.  Keep all follow-up visits as told by your health care provider. This is important. How is this prevented?  Warm up and stretch before physical activity.  Stretch after physical activity.  Learn and use correct techniques for exercising and playing sports.  Avoid difficult physical activities if your muscles are tired or sore.  Strengthen your gluteal muscles and the surrounding muscles.  Develop your flexibility by stretching at least once a day. Contact a health care provider if:  You have pain or swelling that gets worse or does not get better with medicine.  You have "shooting" pain that moves through your leg. Get help right away if:  You develop weakness in part of your body.  You lose feeling in part of your body.  You have severe pain.  You are unable to walk.  You have difficulty controlling your bladder or bowel movements. This information is not intended to replace advice given to you by your health care provider. Make sure you discuss any questions you have with your health care provider. Document Released: 03/28/2009 Document Revised: 11/06/2015 Document Reviewed: 10/16/2014 Elsevier Interactive  Patient Education  2018 [employer].

## 2017-10-18 ENCOUNTER — Other Ambulatory Visit: Payer: Self-pay | Admitting: Family Medicine

## 2017-11-08 ENCOUNTER — Encounter: Payer: Self-pay | Admitting: Family Medicine

## 2018-01-26 ENCOUNTER — Other Ambulatory Visit: Payer: Self-pay | Admitting: Family Medicine

## 2018-01-26 NOTE — Telephone Encounter (Signed)
Lisinopril-HCTZ refill Last Refill:10/18/17 # 90 No Rf Last OV: 08/11/17 PCP: Dr Brigitte Pulse Pharmacy:[facility]

## 2018-02-08 DIAGNOSIS — H60332 Swimmer's ear, left ear: Secondary | ICD-10-CM | POA: Insufficient documentation

## 2018-03-06 ENCOUNTER — Encounter: Payer: 59 | Admitting: Family Medicine

## 2018-03-17 ENCOUNTER — Encounter: Payer: 59 | Admitting: Family Medicine

## 2018-03-22 ENCOUNTER — Other Ambulatory Visit: Payer: Self-pay | Admitting: Family Medicine

## 2018-04-26 ENCOUNTER — Other Ambulatory Visit: Payer: Self-pay

## 2018-04-26 ENCOUNTER — Ambulatory Visit (INDEPENDENT_AMBULATORY_CARE_PROVIDER_SITE_OTHER): Payer: 59 | Admitting: Family Medicine

## 2018-04-26 ENCOUNTER — Encounter: Payer: Self-pay | Admitting: Family Medicine

## 2018-04-26 VITALS — BP 111/69 | HR 60 | Temp 98.0°F | Resp 16 | Ht 69.0 in | Wt 243.0 lb

## 2018-04-26 DIAGNOSIS — I10 Essential (primary) hypertension: Secondary | ICD-10-CM | POA: Diagnosis not present

## 2018-04-26 DIAGNOSIS — I25118 Atherosclerotic heart disease of native coronary artery with other forms of angina pectoris: Secondary | ICD-10-CM

## 2018-04-26 DIAGNOSIS — Z125 Encounter for screening for malignant neoplasm of prostate: Secondary | ICD-10-CM

## 2018-04-26 DIAGNOSIS — Z0001 Encounter for general adult medical examination with abnormal findings: Secondary | ICD-10-CM

## 2018-04-26 DIAGNOSIS — N401 Enlarged prostate with lower urinary tract symptoms: Secondary | ICD-10-CM

## 2018-04-26 DIAGNOSIS — Z0000 Encounter for general adult medical examination without abnormal findings: Secondary | ICD-10-CM

## 2018-04-26 DIAGNOSIS — E782 Mixed hyperlipidemia: Secondary | ICD-10-CM | POA: Diagnosis not present

## 2018-04-26 DIAGNOSIS — R350 Frequency of micturition: Secondary | ICD-10-CM

## 2018-04-26 DIAGNOSIS — Z6834 Body mass index (BMI) 34.0-34.9, adult: Secondary | ICD-10-CM | POA: Diagnosis not present

## 2018-04-26 LAB — POCT URINALYSIS DIP (MANUAL ENTRY)
BILIRUBIN UA: NEGATIVE
GLUCOSE UA: NEGATIVE mg/dL
Ketones, POC UA: NEGATIVE mg/dL
Leukocytes, UA: NEGATIVE
NITRITE UA: NEGATIVE
Protein Ur, POC: NEGATIVE mg/dL
RBC UA: NEGATIVE
Spec Grav, UA: 1.02 (ref 1.010–1.025)
Urobilinogen, UA: 0.2 E.U./dL
pH, UA: 7 (ref 5.0–8.0)

## 2018-04-26 MED ORDER — ATORVASTATIN CALCIUM 80 MG PO TABS: 80.0000 mg | ORAL_TABLET | Freq: Every day | ORAL | 4 refills | Status: DC

## 2018-04-26 MED ORDER — LISINOPRIL-HYDROCHLOROTHIAZIDE 20-25 MG PO TABS: 1.0000 | ORAL_TABLET | Freq: Every day | ORAL | 4 refills | Status: DC

## 2018-04-26 MED ORDER — FINASTERIDE 5 MG PO TABS: 5.0000 mg | ORAL_TABLET | Freq: Every day | ORAL | 3 refills | Status: DC

## 2018-04-26 MED ORDER — ZOSTER VAC RECOMB ADJUVANTED 50 MCG/0.5ML IM SUSR: 0.5000 mL | Freq: Once | INTRAMUSCULAR | 1 refills | Status: AC

## 2018-04-26 NOTE — Progress Notes (Signed)
Subjective:    Patient ID: Johnny Wilcox; male   DOB: 1951/02/18; 67 y.o.   MRN: [id number]  Chief Complaint  Patient presents with  . Annual Exam    HPI Primary Preventative Screenings: Prostate Cancer: STI screening:  NO cancer at all in family  Colorectal Cancer: Tobacco use/AAA/Lung/EtOH/Illicit substances:  Cardiac: EKG done 02/07/2017 with incomplete RBBB Weight/Blood sugar/Diet/Exercise: BMI Readings from Last 3 Encounters:  08/11/17 33.83 kg/m  06/04/17 34.35 kg/m  02/18/17 32.20 kg/m   Lab Results  Component Value Date   HGBA1C 5.9 (H) 06/04/2017   OTC/Vit/Supp/Herbal: Dentist/Optho: Immunizations:  Immunization History  Administered Date(s) Administered  . Influenza Split 03/22/2012  . Influenza,inj,Quad PF,6+ Mos 03/20/2014, 03/12/2015  . Influenza-Unspecified 03/21/2016, 04/18/2017  . Pneumococcal Conjugate-13 03/20/2014  . Pneumococcal Polysaccharide-23 03/22/2012, 06/04/2017  . Tdap 10/12/2006, 05/20/2016  . Zoster 06/14/2010    Chronic Medical Conditions: Patient Active Problem List   Diagnosis Date Noted  . Acute medial meniscus tear of right knee 02/18/2017  . Chondromalacia, right knee 02/18/2017  . Hearing loss at 3000 bilat 03/2014 03/20/2014  . CTS (carpal tunnel syndrome)-bilat 2015 03/20/2014  . Full dentures 03/20/2014  . CAD (coronary artery disease) 10/04/2013  . Angina pectoris ([place]) 09/21/2013  . Chest pain 08/22/2013  . ED (erectile dysfunction) 04/04/2012  . HTN (hypertension) 03/22/2012  . BMI 34.0-34.9,adult 03/22/2012  . Diverticulosis Colonoscopy 2003 03/22/2012  . BPH (benign prostatic hyperplasia)-mild July 2003 03/22/2012  . Hyperlipidemia-Mild 03/22/2012     Medical History: Past Medical History:  Diagnosis Date  . CAD in native artery    a. abnormal nuc 2015 - s/p DES to RCA, minimal disease in prox LAD, EF 55-65%.  . Cataract    removed from left eye  . ED (erectile dysfunction)   . Hyperlipidemia   .  Hypertension    Past Surgical History:  Procedure Laterality Date  . CATARACT EXTRACTION Right   . COLONOSCOPY    . CORONARY STENT PLACEMENT    . EYE SURGERY     child injury-repair.    [name] KNEE ARTHROSCOPY Right 02/18/2017   Procedure: ARTHROSCOPY RIGHT KNEE WITH PARTIAL MEDIAL MENISECTOMY, MEDIAL CHONDROPLASTY AND PATELLA, AND MEDIAL PLICO EXCISION;  Surgeon: [name], MD;  Location: [facility];  Service: Orthopedics;  Laterality: Right;  . LEFT HEART CATHETERIZATION WITH CORONARY ANGIOGRAM N/A 09/21/2013   Procedure: LEFT HEART CATHETERIZATION WITH CORONARY ANGIOGRAM;  Surgeon: [name], MD;  Location: [facility] CATH LAB;  Service: Cardiovascular;  Laterality: N/A;  . MULTIPLE EXTRACTIONS WITH ALVEOLOPLASTY  12/06/2011   Procedure: MULTIPLE EXTRACION WITH ALVEOLOPLASTY;  Surgeon: [name], DDS;  Location: [place];  Service: Oral Surgery;  Laterality: Bilateral;  Biopsy tissue right vestibule  . PERCUTANEOUS CORONARY STENT INTERVENTION (PCI-S)  09/21/2013   Procedure: PERCUTANEOUS CORONARY STENT INTERVENTION (PCI-S);  Surgeon: [name], MD;  Location[facility] CATH LAB;  Service: Cardiovascular;;  . VASECTOMY     Current Outpatient Medications on File Prior to Visit  Medication Sig Dispense Refill  . aspirin EC 81 MG tablet Take 81 mg by mouth daily.    [name] atorvastatin (LIPITOR) 80 MG tablet Take 1 tablet (80 mg total) by mouth daily. 90 tablet 3  . lisinopril-hydrochlorothiazide (PRINZIDE,ZESTORETIC) 20-25 MG tablet TAKE 1 TABLET BY MOUTH  DAILY 60 tablet 0  . silodosin (RAPAFLO) 8 MG CAPS capsule Take 1 capsule (8 mg total) by mouth daily with breakfast. 90 capsule 0   No current facility-administered medications on file prior to  visit.    No Known Allergies Family History  Problem Relation Age of Onset  . Heart disease Father   . Heart disease Mother   . Stroke Mother   . Hypertension Sister   . Colon cancer Neg Hx   . Esophageal cancer Neg Hx   . Rectal cancer Neg Hx   .  Stomach cancer Neg Hx    Social History   Socioeconomic History  . Marital status: Married    Spouse name: Not on file  . Number of children: Not on file  . Years of education: Not on file  . Highest education level: Not on file  Occupational History  . Not on file  Social Needs  . Financial resource strain: Not on file  . Food insecurity:    Worry: Not on file    Inability: Not on file  . Transportation needs:    Medical: Not on file    Non-medical: Not on file  Tobacco Use  . Smoking status: Never Smoker  . Smokeless tobacco: Current User    Types: Chew  Substance and Sexual Activity  . Alcohol use: Yes    Comment:  maybe 3 beers a month  . Drug use: No  . Sexual activity: Yes  Lifestyle  . Physical activity:    Days per week: Not on file    Minutes per session: Not on file  . Stress: Not on file  Relationships  . Social connections:    Talks on phone: Not on file    Gets together: Not on file    Attends religious service: Not on file    Active member of club or organization: Not on file    Attends meetings of clubs or organizations: Not on file    Relationship status: Not on file  Other Topics Concern  . Not on file  Social History Narrative  . Not on file   Depression screen [facility] 2/9 08/11/2017 06/04/2017 02/03/2017 02/03/2017 01/14/2017  Decreased Interest 0 0 0 0 0  Down, Depressed, Hopeless 0 0 0 0 0  PHQ - 2 Score 0 0 0 0 0     ROS As noted in HPI All others reviewed and negative.     Objective:  BP 111/69   Pulse 60   Temp 98 F (36.7 C) (Oral)   Resp 16   Ht 5\' 9"  (1.753 m)   Wt 243 lb (110.2 kg)   SpO2 96%   BMI 35.88 kg/m   Visual Acuity Screening   Right eye Left eye Both eyes  Without correction:     With correction: 20/15 20/40 20/15    Physical Exam  Constitutional: He is oriented to person, place, and time. He appears well-developed and well-nourished. No distress.  HENT:  Head: Normocephalic and atraumatic.  Right Ear: Tympanic  membrane, external ear and ear canal normal.  Left Ear: Tympanic membrane, external ear and ear canal normal.  Nose: Nose normal.  Mouth/Throat: Uvula is midline, oropharynx is clear and moist and mucous membranes are normal. No oropharyngeal exudate.  Eyes: Conjunctivae are normal. Right eye exhibits no discharge. Left eye exhibits no discharge. No scleral icterus.  Neck: Normal range of motion. Neck supple. No thyromegaly present.  Cardiovascular: Normal rate, regular rhythm, normal heart sounds and intact distal pulses.  Pulmonary/Chest: Effort normal and breath sounds normal. No respiratory distress.  Abdominal: Soft. Bowel sounds are normal. He exhibits no distension and no mass. There is no tenderness. There is no rebound  and no guarding.  Genitourinary: Rectum normal and prostate normal. Prostate is not enlarged and not tender.  Musculoskeletal: He exhibits no edema.  Lymphadenopathy:    He has no cervical adenopathy.  Neurological: He is alert and oriented to person, place, and time. He has normal reflexes. He displays normal reflexes. No cranial nerve deficit. He exhibits normal muscle tone.  Skin: Skin is warm and dry. No rash noted. He is not diaphoretic. No erythema.  Psychiatric: He has a normal mood and affect. His behavior is normal.   [facility] Office Visit on 04/26/2018  Component Date Value Ref Range Status  . WBC 04/26/2018 5.6  3.4 - 10.8 x10E3/uL Final  . RBC 04/26/2018 4.29  4.14 - 5.80 x10E6/uL Final  . Hemoglobin 04/26/2018 13.5  13.0 - 17.7 g/dL Final  . Hematocrit 04/26/2018 40.2  37.5 - 51.0 % Final  . MCV 04/26/2018 94  79 - 97 fL Final  . MCH 04/26/2018 31.5  26.6 - 33.0 pg Final  . MCHC 04/26/2018 33.6  31.5 - 35.7 g/dL Final  . RDW 04/26/2018 12.4  12.3 - 15.4 % Final  . Platelets 04/26/2018 229  150 - 450 x10E3/uL Final  . Neutrophils 04/26/2018 62  Not Estab. % Final  . Lymphs 04/26/2018 26  Not Estab. % Final  . Monocytes 04/26/2018 9  Not Estab. %  Final  . Eos 04/26/2018 2  Not Estab. % Final  . Basos 04/26/2018 1  Not Estab. % Final  . Neutrophils Absolute 04/26/2018 3.5  1.4 - 7.0 x10E3/uL Final  . Lymphocytes Absolute 04/26/2018 1.4  0.7 - 3.1 x10E3/uL Final  . Monocytes Absolute 04/26/2018 0.5  0.1 - 0.9 x10E3/uL Final  . EOS (ABSOLUTE) 04/26/2018 0.1  0.0 - 0.4 x10E3/uL Final  . Basophils Absolute 04/26/2018 0.0  0.0 - 0.2 x10E3/uL Final  . Immature Granulocytes 04/26/2018 0  Not Estab. % Final  . Immature Grans (Abs) 04/26/2018 0.0  0.0 - 0.1 x10E3/uL Final  . Glucose 04/26/2018 90  65 - 99 mg/dL Final  . BUN 04/26/2018 17  8 - 27 mg/dL Final  . Creatinine, Ser 04/26/2018 1.10  0.76 - 1.27 mg/dL Final  . GFR calc non Af Amer 04/26/2018 69  >59 mL/min/1.73 Final  . GFR calc Af Amer 04/26/2018 79  >59 mL/min/1.73 Final  . BUN/Creatinine Ratio 04/26/2018 15  10 - 24 Final  . Sodium 04/26/2018 141  134 - 144 mmol/L Final  . Potassium 04/26/2018 3.8  3.5 - 5.2 mmol/L Final  . Chloride 04/26/2018 102  96 - 106 mmol/L Final  . CO2 04/26/2018 25  20 - 29 mmol/L Final  . Calcium 04/26/2018 9.5  8.6 - 10.2 mg/dL Final  . Total Protein 04/26/2018 7.0  6.0 - 8.5 g/dL Final  . Albumin 04/26/2018 4.4  3.6 - 4.8 g/dL Final  . Globulin, Total 04/26/2018 2.6  1.5 - 4.5 g/dL Final  . Albumin/Globulin Ratio 04/26/2018 1.7  1.2 - 2.2 Final  . Bilirubin Total 04/26/2018 0.5  0.0 - 1.2 mg/dL Final  . Alkaline Phosphatase 04/26/2018 68  39 - 117 IU/L Final  . AST 04/26/2018 20  0 - 40 IU/L Final  . ALT 04/26/2018 19  0 - 44 IU/L Final  . Cholesterol, Total 04/26/2018 96* 100 - 199 mg/dL Final  . Triglycerides 04/26/2018 139  0 - 149 mg/dL Final  . HDL 04/26/2018 32* >39 mg/dL Final  . VLDL Cholesterol Cal 04/26/2018 28  5 - 40 mg/dL Final  .  LDL Calculated 04/26/2018 36  0 - 99 mg/dL Final  . Chol/HDL Ratio 04/26/2018 3.0  0.0 - 5.0 ratio Final   Comment:                                   T. Chol/HDL Ratio                                              Men  Women                               1/2 Avg.Risk  3.4    3.3                                   Avg.Risk  5.0    4.4                                2X Avg.Risk  9.6    7.1                                3X Avg.Risk 23.4   11.0   . Prostate Specific Ag, Serum 04/26/2018 0.7  0.0 - 4.0 ng/mL Final   Comment: Roche ECLIA methodology. According to the American Urological Association, Serum PSA should decrease and remain at undetectable levels after radical prostatectomy. The AUA defines biochemical recurrence as an initial PSA value 0.2 ng/mL or greater followed by a subsequent confirmatory PSA value 0.2 ng/mL or greater. Values obtained with different assay methods or kits cannot be used interchangeably. Results cannot be interpreted as absolute evidence of the presence or absence of malignant disease.   . Color, UA 04/26/2018 yellow  yellow Final  . Clarity, UA 04/26/2018 clear  clear Final  . Glucose, UA 04/26/2018 negative  negative mg/dL Final  . Bilirubin, UA 04/26/2018 negative  negative Final  . Ketones, POC UA 04/26/2018 negative  negative mg/dL Final  . Spec Grav, UA 04/26/2018 1.020  1.010 - 1.025 Final  . Blood, UA 04/26/2018 negative  negative Final  . pH, UA 04/26/2018 7.0  5.0 - 8.0 Final  . Protein Ur, POC 04/26/2018 negative  negative mg/dL Final  . Urobilinogen, UA 04/26/2018 0.2  0.2 or 1.0 E.U./dL Final  . Nitrite, UA 04/26/2018 Negative  Negative Final  . Leukocytes, UA 04/26/2018 Negative  Negative Final  . TSH 04/26/2018 1.160  0.450 - 4.500 uIU/mL Final     Assessment & Plan:   1. Annual physical exam   2. Essential hypertension   3. Mixed hyperlipidemia   4. Screening for prostate cancer   5. Benign prostatic hyperplasia with urinary frequency   6. BMI 34.0-34.9,adult   7. Coronary artery disease involving native coronary artery of native heart with other form of angina pectoris ([place])     Gets tdap before going on medicare.   Patient will  continue on current chronic medications other than changes noted above, so ok to refill when needed.   Reviewed all health maintenance recommendations per USPSTF guidelines.   See after visit summary  for patient specific instructions.  Orders Placed This Encounter  Procedures  . CBC with Differential/Platelet  . Comprehensive metabolic panel    Order Specific Question:   Has the patient fasted?    Answer:   No  . Lipid panel    Order Specific Question:   Has the patient fasted?    Answer:   No  . PSA  . TSH  . POCT urinalysis dipstick    Meds ordered this encounter  Medications  . finasteride (PROSCAR) 5 MG tablet    Sig: Take 1 tablet (5 mg total) by mouth daily.    Dispense:  90 tablet    Refill:  3  . Zoster Vaccine Adjuvanted [facility]) injection    Sig: Inject 0.5 mLs into the muscle once for 1 dose. Repeat once in 2 to 6 months    Dispense:  0.5 mL    Refill:  1  . atorvastatin (LIPITOR) 80 MG tablet    Sig: Take 1 tablet (80 mg total) by mouth daily.    Dispense:  90 tablet    Refill:  4  . lisinopril-hydrochlorothiazide (PRINZIDE,ZESTORETIC) 20-25 MG tablet    Sig: Take 1 tablet by mouth daily.    Dispense:  90 tablet    Refill:  4    Patient verbalized to me that they understand the following: diagnosis, what is being done for them, what to expect and what should be done at home.  Their questions have been answered. They understand that I am unable to predict every possible medication interaction or adverse outcome and that if any unexpected symptoms arise, they should contact us and their pharmacist, as well as never hesitate to seek urgent/emergent care at [facility] or ER if they think it might be warranted.    Delman Cheadle, MD, MPH [facility] [address] [place], [place]  [address] [phone] Office phone  [phone] Office fax   04/26/18 10:09 AM

## 2018-04-26 NOTE — Patient Instructions (Addendum)

## 2018-04-27 LAB — CBC WITH DIFFERENTIAL/PLATELET
Basophils Absolute: 0 10*3/uL (ref 0.0–0.2)
Basos: 1 %
EOS (ABSOLUTE): 0.1 10*3/uL (ref 0.0–0.4)
Eos: 2 %
Hematocrit: 40.2 % (ref 37.5–51.0)
Hemoglobin: 13.5 g/dL (ref 13.0–17.7)
IMMATURE GRANS (ABS): 0 10*3/uL (ref 0.0–0.1)
IMMATURE GRANULOCYTES: 0 %
Lymphocytes Absolute: 1.4 10*3/uL (ref 0.7–3.1)
Lymphs: 26 %
MCH: 31.5 pg (ref 26.6–33.0)
MCHC: 33.6 g/dL (ref 31.5–35.7)
MCV: 94 fL (ref 79–97)
MONOS ABS: 0.5 10*3/uL (ref 0.1–0.9)
Monocytes: 9 %
NEUTROS PCT: 62 %
Neutrophils Absolute: 3.5 10*3/uL (ref 1.4–7.0)
Platelets: 229 10*3/uL (ref 150–450)
RBC: 4.29 x10E6/uL (ref 4.14–5.80)
RDW: 12.4 % (ref 12.3–15.4)
WBC: 5.6 10*3/uL (ref 3.4–10.8)

## 2018-04-27 LAB — COMPREHENSIVE METABOLIC PANEL
ALT: 19 IU/L (ref 0–44)
AST: 20 IU/L (ref 0–40)
Albumin/Globulin Ratio: 1.7 (ref 1.2–2.2)
Albumin: 4.4 g/dL (ref 3.6–4.8)
Alkaline Phosphatase: 68 IU/L (ref 39–117)
BUN/Creatinine Ratio: 15 (ref 10–24)
BUN: 17 mg/dL (ref 8–27)
Bilirubin Total: 0.5 mg/dL (ref 0.0–1.2)
CALCIUM: 9.5 mg/dL (ref 8.6–10.2)
CO2: 25 mmol/L (ref 20–29)
Chloride: 102 mmol/L (ref 96–106)
Creatinine, Ser: 1.1 mg/dL (ref 0.76–1.27)
GFR calc Af Amer: 79 mL/min/{1.73_m2} (ref >59)
GFR, EST NON AFRICAN AMERICAN: 69 mL/min/{1.73_m2} (ref >59)
GLUCOSE: 90 mg/dL (ref 65–99)
Globulin, Total: 2.6 g/dL (ref 1.5–4.5)
POTASSIUM: 3.8 mmol/L (ref 3.5–5.2)
Sodium: 141 mmol/L (ref 134–144)
Total Protein: 7 g/dL (ref 6.0–8.5)

## 2018-04-27 LAB — LIPID PANEL
CHOL/HDL RATIO: 3 ratio (ref 0.0–5.0)
Cholesterol, Total: 96 mg/dL — ABNORMAL LOW (ref 100–199)
HDL: 32 mg/dL — AB (ref >39)
LDL Calculated: 36 mg/dL (ref 0–99)
TRIGLYCERIDES: 139 mg/dL (ref 0–149)
VLDL CHOLESTEROL CAL: 28 mg/dL (ref 5–40)

## 2018-04-27 LAB — PSA: Prostate Specific Ag, Serum: 0.7 ng/mL (ref 0.0–4.0)

## 2018-04-27 LAB — TSH: TSH: 1.16 u[IU]/mL (ref 0.450–4.500)

## 2018-09-06 ENCOUNTER — Ambulatory Visit: Payer: 59 | Admitting: Emergency Medicine

## 2018-09-06 ENCOUNTER — Encounter: Payer: Self-pay | Admitting: Emergency Medicine

## 2018-09-06 ENCOUNTER — Other Ambulatory Visit: Payer: Self-pay

## 2018-09-06 VITALS — BP 150/82 | HR 60 | Temp 98.4°F | Resp 18 | Ht 69.0 in | Wt 246.0 lb

## 2018-09-06 DIAGNOSIS — M25512 Pain in left shoulder: Secondary | ICD-10-CM

## 2018-09-06 DIAGNOSIS — M25552 Pain in left hip: Secondary | ICD-10-CM | POA: Diagnosis not present

## 2018-09-06 DIAGNOSIS — M5432 Sciatica, left side: Secondary | ICD-10-CM | POA: Diagnosis not present

## 2018-09-06 DIAGNOSIS — M7918 Myalgia, other site: Secondary | ICD-10-CM

## 2018-09-06 DIAGNOSIS — S43402A Unspecified sprain of left shoulder joint, initial encounter: Secondary | ICD-10-CM

## 2018-09-06 MED ORDER — MELOXICAM 15 MG PO TABS: 15.0000 mg | ORAL_TABLET | Freq: Every day | ORAL | 1 refills | Status: AC

## 2018-09-06 MED ORDER — TRAMADOL HCL 50 MG PO TABS: 50.0000 mg | ORAL_TABLET | Freq: Three times a day (TID) | ORAL | 0 refills | Status: DC | PRN

## 2018-09-06 NOTE — Patient Instructions (Addendum)
   If you have lab work done today you will be contacted with your lab results within the next 2 weeks.  If you have not heard from us then please contact us. The fastest way to get your results is to register for My Chart.   IF you received an x-ray today, you will receive an invoice from [facility]. Please contact [facility] at [phone] with questions or concerns regarding your invoice.   IF you received labwork today, you will receive an invoice from [facility]. Please contact [facility] at [phone] with questions or concerns regarding your invoice.   Our billing staff will not be able to assist you with questions regarding bills from these companies.  You will be contacted with the lab results as soon as they are available. The fastest way to get your results is to activate your My Chart account. Instructions are located on the last page of this paperwork. If you have not heard from us regarding the results in 2 weeks, please contact this office.     Sciatica  Sciatica is pain, numbness, weakness, or tingling along your sciatic nerve. The sciatic nerve starts in the lower back and goes down the back of each leg. Sciatica happens when this nerve is pinched or has pressure put on it. Sciatica usually goes away on its own or with treatment. Sometimes, sciatica may keep coming back (recur). Follow these instructions at home: Medicines  Take over-the-counter and prescription medicines only as told by your doctor.  Do not drive or use heavy machinery while taking prescription pain medicine. Managing pain  If directed, put ice on the affected area. ? Put ice in a plastic bag. ? Place a towel between your skin and the bag. ? Leave the ice on for 20 minutes, 2-3 times a day.  After icing, apply heat to the affected area before you exercise or as often as told by your doctor. Use the heat source that your doctor tells you to use, such as a moist heat pack or a  heating pad. ? Place a towel between your skin and the heat source. ? Leave the heat on for 20-30 minutes. ? Remove the heat if your skin turns bright red. This is especially important if you are unable to feel pain, heat, or cold. You may have a greater risk of getting burned. Activity  Return to your normal activities as told by your doctor. Ask your doctor what activities are safe for you. ? Avoid activities that make your sciatica worse.  Take short rests during the day. Rest in a lying or standing position. This is usually better than sitting to rest. ? When you rest for a long time, do some physical activity or stretching between periods of rest. ? Avoid sitting for a long time without moving. Get up and move around at least one time each hour.  Exercise and stretch regularly, as told by your doctor.  Do not lift anything that is heavier than 10 lb (4.5 kg) while you have symptoms of sciatica. ? Avoid lifting heavy things even when you do not have symptoms. ? Avoid lifting heavy things over and over.  When you lift objects, always lift in a way that is safe for your body. To do this, you should: ? Bend your knees. ? Keep the object close to your body. ? Avoid twisting. General instructions  Use good posture. ? Avoid leaning forward when you are sitting. ? Avoid hunching over when you   are standing.  Stay at a healthy weight.  Wear comfortable shoes that support your feet. Avoid wearing high heels.  Avoid sleeping on a mattress that is too soft or too hard. You might have less pain if you sleep on a mattress that is firm enough to support your back.  Keep all follow-up visits as told by your doctor. This is important. Contact a doctor if:  You have pain that: ? Wakes you up when you are sleeping. ? Gets worse when you lie down. ? Is worse than the pain you have had in the past. ? Lasts longer than 4 weeks.  You lose weight for without trying. Get help right away  if:  You cannot control when you pee (urinate) or poop (have a bowel movement).  You have weakness in any of these areas and it gets worse. ? Lower back. ? Lower belly (pelvis). ? Butt (buttocks). ? Legs.  You have redness or swelling of your back.  You have a burning feeling when you pee. This information is not intended to replace advice given to you by your health care provider. Make sure you discuss any questions you have with your health care provider. Document Released: 03/09/2008 Document Revised: 11/06/2015 Document Reviewed: 02/07/2015 Elsevier Interactive Patient Education  2019 [employer].  Shoulder Pain Many things can cause shoulder pain, including:  An injury.  Moving the shoulder in the same way again and again (overuse).  Joint pain (arthritis). Pain can come from:  Swelling and irritation (inflammation) of any part of the shoulder.  An injury to the shoulder joint.  An injury to: ? Tissues that connect muscle to bone (tendons). ? Tissues that connect bones to each other (ligaments). ? Bones. Follow these instructions at home: Watch for changes in your symptoms. Let your doctor know about them. Follow these instructions to help with your pain. If you have a sling:  Wear the sling as told by your doctor. Remove it only as told by your doctor.  Loosen the sling if your fingers: ? Tingle. ? Become numb. ? Turn cold and blue.  Keep the sling clean.  If the sling is not waterproof: ? Do not let it get wet. ? Take the sling off when you shower or bathe. Managing pain, stiffness, and swelling   If told, put ice on the painful area: ? Put ice in a plastic bag. ? Place a towel between your skin and the bag. ? Leave the ice on for 20 minutes, 2-3 times a day. Stop putting ice on if it does not help with the pain.  Squeeze a soft ball or a foam pad as much as possible. This prevents swelling in the shoulder. It also helps to strengthen the  arm. General instructions  Take over-the-counter and prescription medicines only as told by your doctor.  Keep all follow-up visits as told by your doctor. This is important. Contact a doctor if:  Your pain gets worse.  Medicine does not help your pain.  You have new pain in your arm, hand, or fingers. Get help right away if:  Your arm, hand, or fingers: ? Tingle. ? Are numb. ? Are swollen. ? Are painful. ? Turn white or blue. Summary  Shoulder pain can be caused by many things. These include injury, moving the shoulder in the same away again and again, and joint pain.  Watch for changes in your symptoms. Let your doctor know about them.  This condition may be treated with  a sling, ice, and pain medicine.  Contact your doctor if the pain gets worse or you have new pain. Get help right away if your arm, hand, or fingers tingle or get numb, swollen, or painful.  Keep all follow-up visits as told by your doctor. This is important. This information is not intended to replace advice given to you by your health care provider. Make sure you discuss any questions you have with your health care provider. Document Released: 11/17/2007 Document Revised: 12/13/2017 Document Reviewed: 12/13/2017 Elsevier Interactive Patient Education  2019 [employer].

## 2018-09-06 NOTE — Progress Notes (Signed)
Johnny Wilcox 68 y.o.   Chief Complaint  Patient presents with  . Back Pain    started with back and left hip having been hurting for x2weeks and now left shoulder is also hurting for about a day     HISTORY OF PRESENT ILLNESS: This is a 68 y.o. male complaining of pain to left hip for 2 weeks.  States pain is constant and starts at the left lumbar area with some radiation to left buttock and down the back of his left leg.  Has history of similar episodes in the past.  Denies direct injury or trauma to the area.  Still able to ambulate okay.  Difficult to go up and down stairs or to get up and down the chair.  Also complaining of pain to left shoulder that started a couple days ago.  Constant pain worse with movement.  No associated symptoms.   HPI   Prior to Admission medications   Medication Sig Start Date End Date Taking? Authorizing Provider  aspirin EC 81 MG tablet Take 81 mg by mouth daily.   Yes [provider]  atorvastatin (LIPITOR) 80 MG tablet Take 1 tablet (80 mg total) by mouth daily. 04/26/18  Yes Shawnee Knapp, MD  finasteride (PROSCAR) 5 MG tablet Take 1 tablet (5 mg total) by mouth daily. 04/26/18  Yes Shawnee Knapp, MD  lisinopril-hydrochlorothiazide (PRINZIDE,ZESTORETIC) 20-25 MG tablet Take 1 tablet by mouth daily. 04/26/18  Yes Shawnee Knapp, MD    No Known Allergies  Patient Active Problem List   Diagnosis Date Noted  . Chondromalacia, right knee 02/18/2017  . Hearing loss at 3000 bilat 03/2014 03/20/2014  . CTS (carpal tunnel syndrome)-bilat 2015 03/20/2014  . Full dentures 03/20/2014  . CAD (coronary artery disease) 10/04/2013  . Angina pectoris ([place]) 09/21/2013  . ED (erectile dysfunction) 04/04/2012  . HTN (hypertension) 03/22/2012  . BMI 34.0-34.9,adult 03/22/2012  . Diverticulosis Colonoscopy 2003 03/22/2012  . BPH (benign prostatic hyperplasia)-mild July 2003 03/22/2012  . Hyperlipidemia-Mild 03/22/2012    Past Medical History:  Diagnosis  Date  . CAD in native artery    a. abnormal nuc 2015 - s/p DES to RCA, minimal disease in prox LAD, EF 55-65%.  . Cataract    removed from left eye  . ED (erectile dysfunction)   . Hyperlipidemia   . Hypertension     Past Surgical History:  Procedure Laterality Date  . CATARACT EXTRACTION Right   . COLONOSCOPY    . CORONARY STENT PLACEMENT    . EYE SURGERY     child injury-repair.    [name] KNEE ARTHROSCOPY Right 02/18/2017   Procedure: ARTHROSCOPY RIGHT KNEE WITH PARTIAL MEDIAL MENISECTOMY, MEDIAL CHONDROPLASTY AND PATELLA, AND MEDIAL PLICO EXCISION;  Surgeon: [name], MD;  Location: [facility];  Service: Orthopedics;  Laterality: Right;  . LEFT HEART CATHETERIZATION WITH CORONARY ANGIOGRAM N/A 09/21/2013   Procedure: LEFT HEART CATHETERIZATION WITH CORONARY ANGIOGRAM;  Surgeon: [name], MD;  Location: [facility] CATH LAB;  Service: Cardiovascular;  Laterality: N/A;  . MULTIPLE EXTRACTIONS WITH ALVEOLOPLASTY  12/06/2011   Procedure: MULTIPLE EXTRACION WITH ALVEOLOPLASTY;  Surgeon: [name], DDS;  Location: [place];  Service: Oral Surgery;  Laterality: Bilateral;  Biopsy tissue right vestibule  . PERCUTANEOUS CORONARY STENT INTERVENTION (PCI-S)  09/21/2013   Procedure: PERCUTANEOUS CORONARY STENT INTERVENTION (PCI-S);  Surgeon: [name], MD;  Location: [facility] CATH LAB;  Service: Cardiovascular;;  . VASECTOMY      Social History  Socioeconomic History  . Marital status: Married    Spouse name: Not on file  . Number of children: Not on file  . Years of education: Not on file  . Highest education level: Not on file  Occupational History  . Not on file  Social Needs  . Financial resource strain: Not on file  . Food insecurity:    Worry: Not on file    Inability: Not on file  . Transportation needs:    Medical: Not on file    Non-medical: Not on file  Tobacco Use  . Smoking status: Never Smoker  . Smokeless tobacco: Current User    Types: Chew  Substance and Sexual Activity   . Alcohol use: Yes    Comment:  maybe 3 beers a month  . Drug use: No  . Sexual activity: Yes  Lifestyle  . Physical activity:    Days per week: Not on file    Minutes per session: Not on file  . Stress: Not on file  Relationships  . Social connections:    Talks on phone: Not on file    Gets together: Not on file    Attends religious service: Not on file    Active member of club or organization: Not on file    Attends meetings of clubs or organizations: Not on file    Relationship status: Not on file  . Intimate partner violence:    Fear of current or ex partner: Not on file    Emotionally abused: Not on file    Physically abused: Not on file    Forced sexual activity: Not on file  Other Topics Concern  . Not on file  Social History Narrative  . Not on file    Family History  Problem Relation Age of Onset  . Heart disease Father   . Heart disease Mother   . Stroke Mother   . Hypertension Sister   . Colon cancer Neg Hx   . Esophageal cancer Neg Hx   . Rectal cancer Neg Hx   . Stomach cancer Neg Hx      Review of Systems  Constitutional: Negative.  Negative for chills and fever.  HENT: Negative.   Respiratory: Negative for cough and shortness of breath.   Cardiovascular: Negative for chest pain and palpitations.  Gastrointestinal: Negative for abdominal pain, diarrhea, nausea and vomiting.  Genitourinary: Negative.   Musculoskeletal: Positive for back pain and joint pain.  Skin: Negative for rash.  Neurological: Negative.  Negative for dizziness and headaches.  Endo/Heme/Allergies: Negative.    Vitals:   09/06/18 1530  BP: (!) 150/82  Pulse: 60  Resp: 18  Temp: 98.4 F (36.9 C)  SpO2: 97%     Physical Exam Vitals signs reviewed.  Constitutional:      Appearance: Normal appearance.  HENT:     Head: Normocephalic and atraumatic.     Mouth/Throat:     Mouth: Mucous membranes are moist.     Pharynx: Oropharynx is clear.  Eyes:     Extraocular  Movements: Extraocular movements intact.     Pupils: Pupils are equal, round, and reactive to light.  Neck:     Musculoskeletal: Normal range of motion and neck supple.  Cardiovascular:     Rate and Rhythm: Normal rate and regular rhythm.     Heart sounds: Normal heart sounds.  Pulmonary:     Effort: Pulmonary effort is normal.     Breath sounds: Normal breath sounds.  Musculoskeletal:  Lumbar back: He exhibits decreased range of motion, tenderness and pain. He exhibits no bony tenderness, no spasm and normal pulse.     Comments: Left shoulder: Nontender.  Limited range of motion due to pain. Left hip: Nontender.  Limited range of motion due to pain.  Skin:    General: Skin is warm and dry.     Capillary Refill: Capillary refill takes less than 2 seconds.  Neurological:     General: No focal deficit present.     Mental Status: He is alert and oriented to person, place, and time.  Psychiatric:        Mood and Affect: Mood normal.        Behavior: Behavior normal.     A total of 25 minutes was spent in the room with the patient, greater than 50% of which was in counseling/coordination of care regarding differential diagnosis, treatment, medications, prognosis, and need for follow-up if no better or worse.  ASSESSMENT & PLAN: Johnny Wilcox was seen today for back pain.  Diagnoses and all orders for this visit:  Acute pain of left shoulder  Left hip pain  Sprain of left shoulder, unspecified shoulder sprain type, initial encounter  Left sided sciatica  Musculoskeletal pain -     traMADol (ULTRAM) 50 MG tablet; Take 1 tablet (50 mg total) by mouth every 8 (eight) hours as needed for severe pain. -     meloxicam (MOBIC) 15 MG tablet; Take 1 tablet (15 mg total) by mouth daily for 7 days.    Patient Instructions       If you have lab work done today you will be contacted with your lab results within the next 2 weeks.  If you have not heard from [place] then please contact us. The  fastest way to get your results is to register for My Chart.   IF you received an x-ray today, you will receive an invoice from [facility]. Please contact [facility] at [phone] with questions or concerns regarding your invoice.   IF you received labwork today, you will receive an invoice from [place]. Please contact [facility] at [phone] with questions or concerns regarding your invoice.   Our billing staff will not be able to assist you with questions regarding bills from these companies.  You will be contacted with the lab results as soon as they are available. The fastest way to get your results is to activate your My Chart account. Instructions are located on the last page of this paperwork. If you have not heard from [place] regarding the results in 2 weeks, please contact this office.     Sciatica  Sciatica is pain, numbness, weakness, or tingling along your sciatic nerve. The sciatic nerve starts in the lower back and goes down the back of each leg. Sciatica happens when this nerve is pinched or has pressure put on it. Sciatica usually goes away on its own or with treatment. Sometimes, sciatica may keep coming back (recur). Follow these instructions at home: Medicines  Take over-the-counter and prescription medicines only as told by your doctor.  Do not drive or use heavy machinery while taking prescription pain medicine. Managing pain  If directed, put ice on the affected area. ? Put ice in a plastic bag. ? Place a towel between your skin and the bag. ? Leave the ice on for 20 minutes, 2-3 times a day.  After icing, apply heat to the affected area before you exercise or as often as told by  your doctor. Use the heat source that your doctor tells you to use, such as a moist heat pack or a heating pad. ? Place a towel between your skin and the heat source. ? Leave the heat on for 20-30 minutes. ? Remove the heat if your skin turns bright red. This is  especially important if you are unable to feel pain, heat, or cold. You may have a greater risk of getting burned. Activity  Return to your normal activities as told by your doctor. Ask your doctor what activities are safe for you. ? Avoid activities that make your sciatica worse.  Take short rests during the day. Rest in a lying or standing position. This is usually better than sitting to rest. ? When you rest for a long time, do some physical activity or stretching between periods of rest. ? Avoid sitting for a long time without moving. Get up and move around at least one time each hour.  Exercise and stretch regularly, as told by your doctor.  Do not lift anything that is heavier than 10 lb (4.5 kg) while you have symptoms of sciatica. ? Avoid lifting heavy things even when you do not have symptoms. ? Avoid lifting heavy things over and over.  When you lift objects, always lift in a way that is safe for your body. To do this, you should: ? Bend your knees. ? Keep the object close to your body. ? Avoid twisting. General instructions  Use good posture. ? Avoid leaning forward when you are sitting. ? Avoid hunching over when you are standing.  Stay at a healthy weight.  Wear comfortable shoes that support your feet. Avoid wearing high heels.  Avoid sleeping on a mattress that is too soft or too hard. You might have less pain if you sleep on a mattress that is firm enough to support your back.  Keep all follow-up visits as told by your doctor. This is important. Contact a doctor if:  You have pain that: ? Wakes you up when you are sleeping. ? Gets worse when you lie down. ? Is worse than the pain you have had in the past. ? Lasts longer than 4 weeks.  You lose weight for without trying. Get help right away if:  You cannot control when you pee (urinate) or poop (have a bowel movement).  You have weakness in any of these areas and it gets worse. ? Lower back. ? Lower  belly (pelvis). ? Butt (buttocks). ? Legs.  You have redness or swelling of your back.  You have a burning feeling when you pee. This information is not intended to replace advice given to you by your health care provider. Make sure you discuss any questions you have with your health care provider. Document Released: 03/09/2008 Document Revised: 11/06/2015 Document Reviewed: 02/07/2015 Elsevier Interactive Patient Education  2019 [employer].  Shoulder Pain Many things can cause shoulder pain, including:  An injury.  Moving the shoulder in the same way again and again (overuse).  Joint pain (arthritis). Pain can come from:  Swelling and irritation (inflammation) of any part of the shoulder.  An injury to the shoulder joint.  An injury to: ? Tissues that connect muscle to bone (tendons). ? Tissues that connect bones to each other (ligaments). ? Bones. Follow these instructions at home: Watch for changes in your symptoms. Let your doctor know about them. Follow these instructions to help with your pain. If you have a sling:  Wear the  sling as told by your doctor. Remove it only as told by your doctor.  Loosen the sling if your fingers: ? Tingle. ? Become numb. ? Turn cold and blue.  Keep the sling clean.  If the sling is not waterproof: ? Do not let it get wet. ? Take the sling off when you shower or bathe. Managing pain, stiffness, and swelling   If told, put ice on the painful area: ? Put ice in a plastic bag. ? Place a towel between your skin and the bag. ? Leave the ice on for 20 minutes, 2-3 times a day. Stop putting ice on if it does not help with the pain.  Squeeze a soft ball or a foam pad as much as possible. This prevents swelling in the shoulder. It also helps to strengthen the arm. General instructions  Take over-the-counter and prescription medicines only as told by your doctor.  Keep all follow-up visits as told by your doctor. This is  important. Contact a doctor if:  Your pain gets worse.  Medicine does not help your pain.  You have new pain in your arm, hand, or fingers. Get help right away if:  Your arm, hand, or fingers: ? Tingle. ? Are numb. ? Are swollen. ? Are painful. ? Turn white or blue. Summary  Shoulder pain can be caused by many things. These include injury, moving the shoulder in the same away again and again, and joint pain.  Watch for changes in your symptoms. Let your doctor know about them.  This condition may be treated with a sling, ice, and pain medicine.  Contact your doctor if the pain gets worse or you have new pain. Get help right away if your arm, hand, or fingers tingle or get numb, swollen, or painful.  Keep all follow-up visits as told by your doctor. This is important. This information is not intended to replace advice given to you by your health care provider. Make sure you discuss any questions you have with your health care provider. Document Released: 11/17/2007 Document Revised: 12/13/2017 Document Reviewed: 12/13/2017 Elsevier Interactive Patient Education  2019 [employer]      Agustina Caroli, MD [facility]

## 2018-09-10 ENCOUNTER — Encounter (HOSPITAL_BASED_OUTPATIENT_CLINIC_OR_DEPARTMENT_OTHER): Payer: Self-pay | Admitting: Adult Health

## 2018-09-10 ENCOUNTER — Emergency Department (HOSPITAL_BASED_OUTPATIENT_CLINIC_OR_DEPARTMENT_OTHER)
Admission: EM | Admit: 2018-09-10 | Discharge: 2018-09-10 | Disposition: A | Discharge status: Home / Self Care | Payer: 59 | Attending: Emergency Medicine | Admitting: Emergency Medicine

## 2018-09-10 ENCOUNTER — Emergency Department (HOSPITAL_BASED_OUTPATIENT_CLINIC_OR_DEPARTMENT_OTHER): Payer: 59

## 2018-09-10 ENCOUNTER — Other Ambulatory Visit: Payer: Self-pay

## 2018-09-10 DIAGNOSIS — Z79899 Other long term (current) drug therapy: Secondary | ICD-10-CM | POA: Diagnosis not present

## 2018-09-10 DIAGNOSIS — I10 Essential (primary) hypertension: Secondary | ICD-10-CM | POA: Diagnosis not present

## 2018-09-10 DIAGNOSIS — M62838 Other muscle spasm: Secondary | ICD-10-CM | POA: Insufficient documentation

## 2018-09-10 DIAGNOSIS — Z7982 Long term (current) use of aspirin: Secondary | ICD-10-CM | POA: Diagnosis not present

## 2018-09-10 DIAGNOSIS — M25512 Pain in left shoulder: Secondary | ICD-10-CM | POA: Diagnosis present

## 2018-09-10 DIAGNOSIS — I2510 Atherosclerotic heart disease of native coronary artery without angina pectoris: Secondary | ICD-10-CM | POA: Diagnosis not present

## 2018-09-10 MED ORDER — CYCLOBENZAPRINE HCL 10 MG PO TABS: 10.0000 mg | ORAL_TABLET | Freq: Two times a day (BID) | ORAL | 0 refills | Status: DC | PRN

## 2018-09-10 NOTE — ED Notes (Signed)
Patient transported to X-ray 

## 2018-09-10 NOTE — ED Notes (Signed)
ED Provider at bedside. 

## 2018-09-10 NOTE — ED Triage Notes (Signed)
PResents with left sided shoulder pain that began over one week ago. PT has a significant cardiac HX. Movement and breathing make the pain worse. THe pain comes and goes. Nothing makes it better.

## 2018-09-10 NOTE — ED Provider Notes (Signed)
[facility] Provider Note   CSN: [id number] Arrival date & time: 09/10/18  1251    History   Chief Complaint Chief Complaint  Patient presents with   Back Pain    HPI Johnny Wilcox is a 68 y.o. male.     HPI   Left shoulder pain, began greater than 1 week ago, Friday or Saturday Under left shoulder blade in the back Right now pain is 6/10, if moving wrong it becomes unbearable.  Stabbing pain. When it gets really bad it radiates all the way through to the chest. Has had it before. Told before it was muscle spasms. But is normally not this bad. Reports about 15 years ago he was admitted for the pain and had work up and then was told it was muscle in the end.  Does have hx of stent but reports didn't have symptoms prior to this-reports went to dr for carpal tunnel and was told needed stent and this does not feel like symptoms he had prior to stent placement. 3 nights had trouble sleeping because of pain, would try to lay in different ways to help Pain has been constant. Worse with looking up, worse with certain movements. No injuries, has not been using it more No numbness/weakness Hx of carpal tunnel No abdominal pain, no n/v, no CP, no dyspnea No loss of control of bowel or bladder Left hip pain started 2-3 weeks ago, will have that from time to time. Left hip pain runs down to knee.    Taking meloxicam, gave something else that can't remember (per chart tramadol).  Not helping with pain, not trying to take it much.  Heat makes area of pain worse, ice does help some.  Went to urgent care today and was sent here for further care      Past Medical History:  Diagnosis Date   CAD in native artery    a. abnormal nuc 2015 - s/p DES to RCA, minimal disease in prox LAD, EF 55-65%.   Cataract    removed from left eye   ED (erectile dysfunction)    Hyperlipidemia    Hypertension     Patient Active Problem List   Diagnosis Date Noted     Chondromalacia, right knee 02/18/2017   Hearing loss at 3000 bilat 03/2014 03/20/2014   CTS (carpal tunnel syndrome)-bilat 2015 03/20/2014   Full dentures 03/20/2014   CAD (coronary artery disease) 10/04/2013   Angina pectoris ([place]) 09/21/2013   ED (erectile dysfunction) 04/04/2012   HTN (hypertension) 03/22/2012   BMI 34.0-34.9,adult 03/22/2012   Diverticulosis Colonoscopy 2003 03/22/2012   BPH (benign prostatic hyperplasia)-mild July 2003 03/22/2012   Hyperlipidemia-Mild 03/22/2012    Past Surgical History:  Procedure Laterality Date   CATARACT EXTRACTION Right    COLONOSCOPY     CORONARY STENT PLACEMENT     EYE SURGERY     child injury-repair.     KNEE ARTHROSCOPY Right 02/18/2017   Procedure: ARTHROSCOPY RIGHT KNEE WITH PARTIAL MEDIAL MENISECTOMY, MEDIAL CHONDROPLASTY AND PATELLA, AND MEDIAL PLICO EXCISION;  Surgeon: [name], MD;  Location: [facility] ORS;  Service: Orthopedics;  Laterality: Right;   LEFT HEART CATHETERIZATION WITH CORONARY ANGIOGRAM N/A 09/21/2013   Procedure: LEFT HEART CATHETERIZATION WITH CORONARY ANGIOGRAM;  Surgeon: [name], MD;  Location[facility] CATH LAB;  Service: Cardiovascular;  Laterality: N/A;   MULTIPLE EXTRACTIONS WITH ALVEOLOPLASTY  12/06/2011   Procedure: MULTIPLE EXTRACION WITH ALVEOLOPLASTY;  Surgeon: [name], DDS;  Location: [facility]  OR;  Service: Oral Surgery;  Laterality: Bilateral;  Biopsy tissue right vestibule   PERCUTANEOUS CORONARY STENT INTERVENTION (PCI-S)  09/21/2013   Procedure: PERCUTANEOUS CORONARY STENT INTERVENTION (PCI-S);  Surgeon: [name], MD;  Location: [facility] CATH LAB;  Service: Cardiovascular;;   VASECTOMY          Home Medications    Prior to Admission medications   Medication Sig Start Date End Date Taking? Authorizing Provider  aspirin EC 81 MG tablet Take 81 mg by mouth daily.    [provider]  atorvastatin (LIPITOR) 80 MG tablet Take 1 tablet (80 mg total) by mouth daily.  04/26/18   Shawnee Knapp, MD  cyclobenzaprine (FLEXERIL) 10 MG tablet Take 1 tablet (10 mg total) by mouth 2 (two) times daily as needed for muscle spasms. 09/10/18   Gareth Morgan, MD  finasteride (PROSCAR) 5 MG tablet Take 1 tablet (5 mg total) by mouth daily. 04/26/18   Shawnee Knapp, MD  lisinopril-hydrochlorothiazide (PRINZIDE,ZESTORETIC) 20-25 MG tablet Take 1 tablet by mouth daily. 04/26/18   Shawnee Knapp, MD  meloxicam (MOBIC) 15 MG tablet Take 1 tablet (15 mg total) by mouth daily for 7 days. 09/06/18 09/13/18  Horald Pollen, MD  traMADol (ULTRAM) 50 MG tablet Take 1 tablet (50 mg total) by mouth every 8 (eight) hours as needed for severe pain. 09/06/18   Horald Pollen, MD    Family History Family History  Problem Relation Age of Onset   Heart disease Father    Heart disease Mother    Stroke Mother    Hypertension Sister    Colon cancer Neg Hx    Esophageal cancer Neg Hx    Rectal cancer Neg Hx    Stomach cancer Neg Hx     Social History Social History   Tobacco Use   Smoking status: Never Smoker   Smokeless tobacco: Current User    Types: Chew  Substance Use Topics   Alcohol use: Yes    Comment:  maybe 3 beers a month   Drug use: No     Allergies   Patient has no known allergies.   Review of Systems Review of Systems  Constitutional: Negative for fatigue and fever.  HENT: Negative for sore throat.   Eyes: Negative for visual disturbance.  Respiratory: Negative for shortness of breath.   Cardiovascular: Positive for chest pain.  Gastrointestinal: Negative for abdominal pain, diarrhea, nausea and vomiting.  Genitourinary: Negative for difficulty urinating.  Musculoskeletal: Positive for arthralgias and back pain. Negative for neck stiffness.  Skin: Negative for rash.  Neurological: Negative for syncope, facial asymmetry, weakness, numbness and headaches.     Physical Exam Updated Vital Signs BP (!) 162/87 (BP Location: Left Arm)     Pulse (!) 55    Temp 97.7 F (36.5 C)    Resp 17    Ht 5\' 9"  (1.753 m)    Wt 111 kg    SpO2 97%    BMI 36.14 kg/m   Physical Exam Vitals signs and nursing note reviewed.  Constitutional:      General: He is not in acute distress.    Appearance: He is well-developed. He is not diaphoretic.  HENT:     Head: Normocephalic and atraumatic.  Eyes:     Conjunctiva/sclera: Conjunctivae normal.  Neck:     Musculoskeletal: Normal range of motion.  Cardiovascular:     Rate and Rhythm: Normal rate and regular rhythm.     Heart  sounds: Normal heart sounds. No murmur. No friction rub. No gallop.      Comments: Tenderness to left chest and left side of back Pulmonary:     Effort: Pulmonary effort is normal. No respiratory distress.     Breath sounds: Normal breath sounds. No wheezing or rales.  Abdominal:     General: There is no distension.     Palpations: Abdomen is soft.     Tenderness: There is no abdominal tenderness. There is no guarding.  Skin:    General: Skin is warm and dry.  Neurological:     Mental Status: He is alert and oriented to person, place, and time.      ED Treatments / Results  Labs (all labs ordered are listed, but only abnormal results are displayed) Labs Reviewed - No data to display  EKG EKG Interpretation  Date/Time:  Sunday September 10 2018 13:03:39 EDT Ventricular Rate:  58 PR Interval:    QRS Duration: 105 QT Interval:  443 QTC Calculation: 436 R Axis:   64 Text Interpretation:  Sinus rhythm No significant change since last tracing Confirmed by Jaylenne Hamelin ([id number]) on 09/10/2018 1:31:15 PM   Radiology Dg Chest 2 View  Result Date: 09/10/2018 CLINICAL DATA:  Left upper back pain for a week. EXAM: CHEST - 2 VIEW COMPARISON:  August 05, 2013 FINDINGS: The heart size and mediastinal contours are within normal limits. Both lungs are clear. The visualized skeletal structures are unremarkable. IMPRESSION: No active cardiopulmonary disease.  Electronically Signed   By: Hadi  Williams III M.D   On: 09/10/2018 14:11    Procedures Procedures (including critical care time)  Medications Ordered in ED Medications - No data to display   Initial Impression / Assessment and Plan / ED Course  I have reviewed the triage vital signs and the nursing notes.  Pertinent labs & imaging results that were available during my care of the patient were reviewed by me and considered in my medical decision making (see chart for details).        67yo male with history of htn, hlpd, CAD, presents with concern for left sided back pain and left hip pain for one week. Was seen at [facility] [place] 3/25 for the same.  Describes pain that is worse with certain movements and palpation, most consistent with muscular pain.  He has normal bilateral upper and lower extremity pulses, normal bilateral blood pressures, and given duration of symptoms, normal puses/BP, pain worse with movements, I doubt the pain represents aortic dissection.  Given above I also have low suspicion for cardiac etiology of pain.  CXR shows no acute findings.  No loss of control of bowel or bladder, no numbness or weakness, doubt acute spinal pathology. No hx of trauma to suggest fracture.  Given history and exam most consistent with muscular strain, history of similar pain, will plan on rx for flexeril and continued supportive care.  Patient discharged in stable condition with understanding of reasons to return.   Final Clinical Impressions(s) / ED Diagnoses   Final diagnoses:  Muscle spasm    ED Discharge Orders         Ordered    cyclobenzaprine (FLEXERIL) 10 MG tablet  2 times daily PRN,   Status:  Discontinued     09/10/18 1426    cyclobenzaprine (FLEXERIL) 10 MG tablet  2 times daily PRN     03 /29/20 1428           Gareth Morgan, MD  09/10/18 1457 ° °

## 2018-12-23 IMAGING — MR MR KNEE*R* W/O CM
4 of 5 series · 23 of 40 positions shown · non-contrast
Comparison: None.

CLINICAL DATA: Patient suffered injury to right knee on 06/05/16.
Now has pain and swelling, most of the pain concentrated to the
lateral side with pain running down the lateral side of the tib-fib.

EXAM:
MRI OF THE RIGHT KNEE WITHOUT CONTRAST
TECHNIQUE: Multiplanar, multisequence MR imaging of the knee was performed. No
intravenous contrast was administered.

[Series 3: PD fat-sat · axial · 4.0mm · 0.42mm/px · z∈[-83,+42]mm · 9 of 27 slices shown (1 of 3)]
[im 1/27]
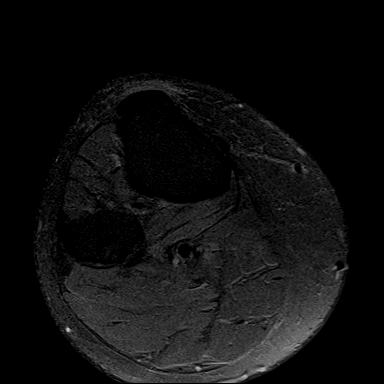
[im 4/27]
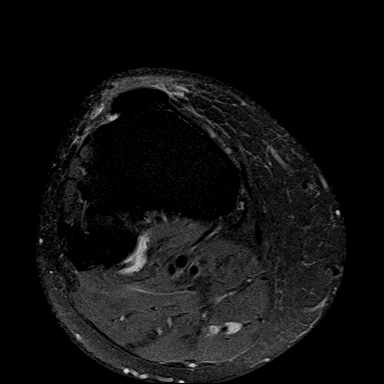
[im 7/27]
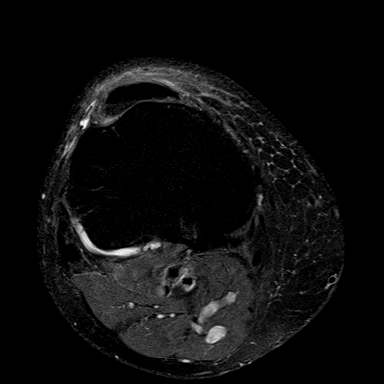
[im 10/27]
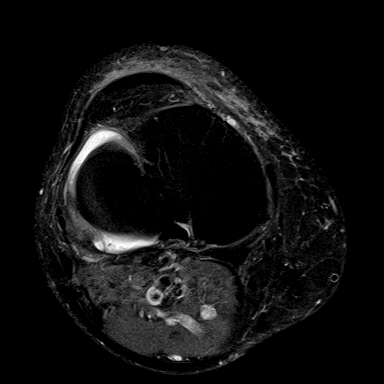
[im 14/27]
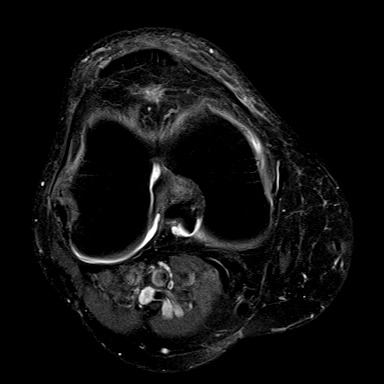
[im 17/27]
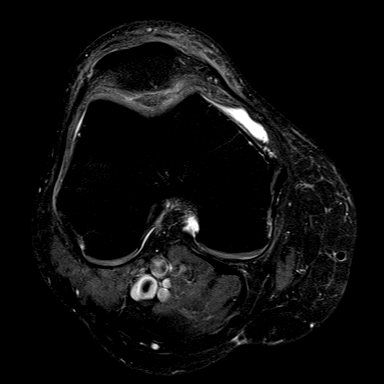
[im 20/27]
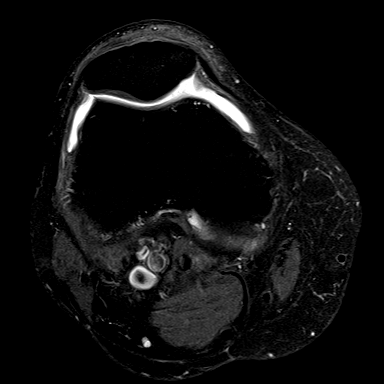
[im 23/27]
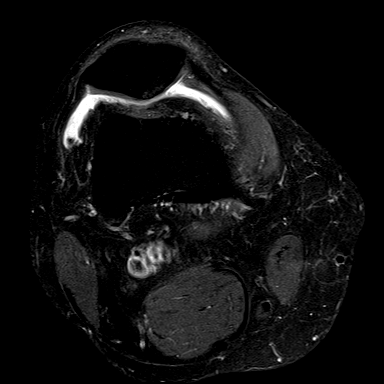
[im 27/27]
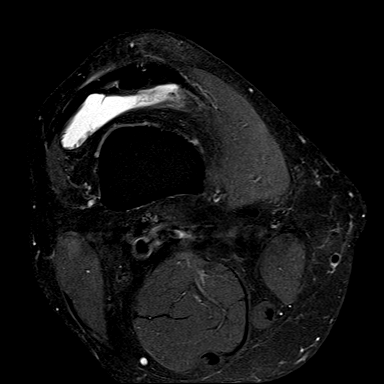

[Series 5: T2 fat-sat · coronal · 3.0mm · 0.29mm/px · 3 of 28 slices shown]
[im 4/28]
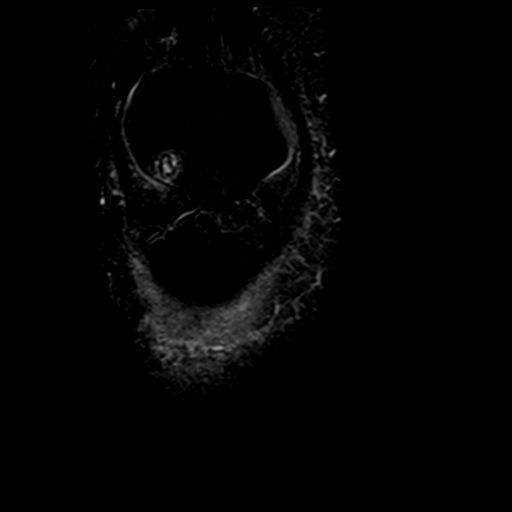
[im 16/28]
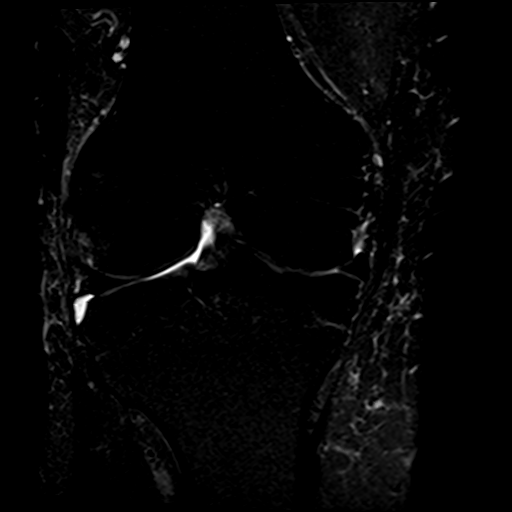
[im 24/28]
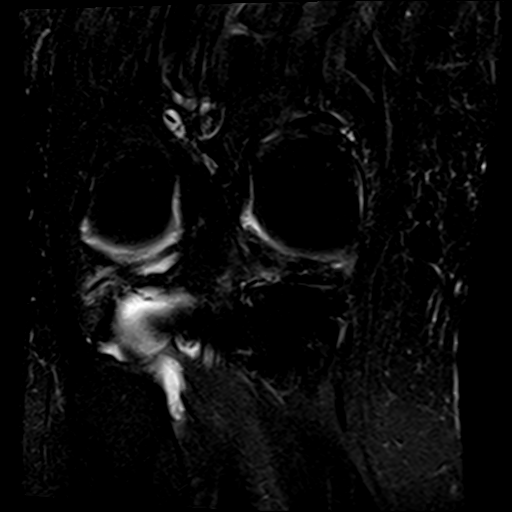

[Series 6: PD fat-sat · coronal · 3.0mm · 0.29mm/px · 8 of 28 slices shown (2 of 3)]
[im 1/28]
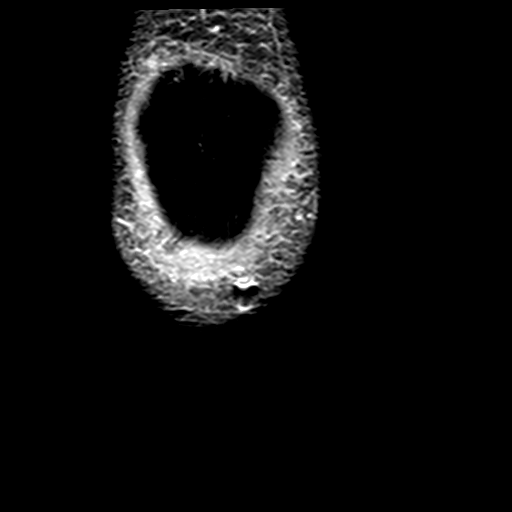
[im 4/28]
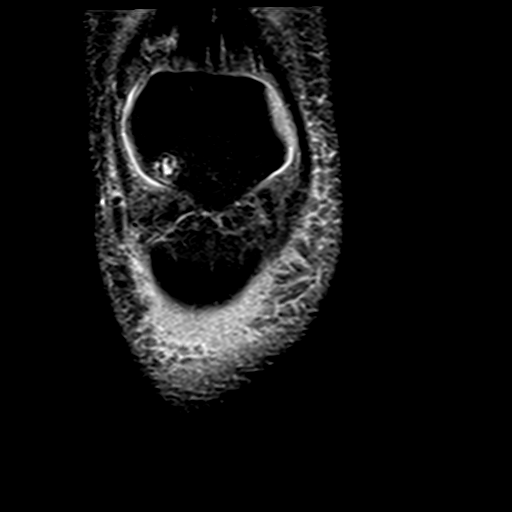
[im 8/28]
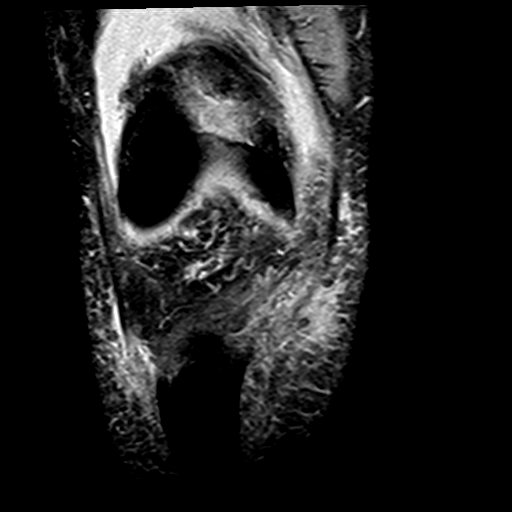
[im 12/28]
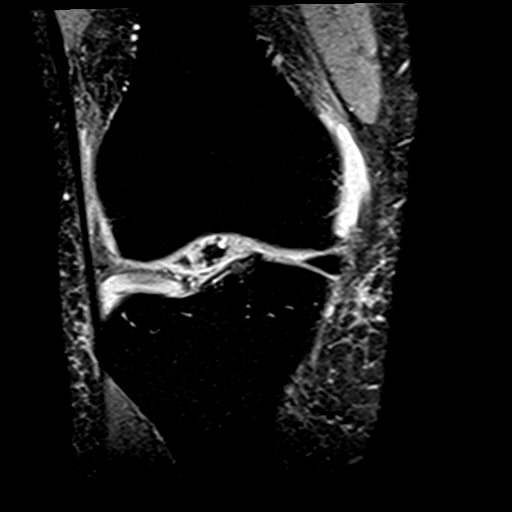
[im 16/28]
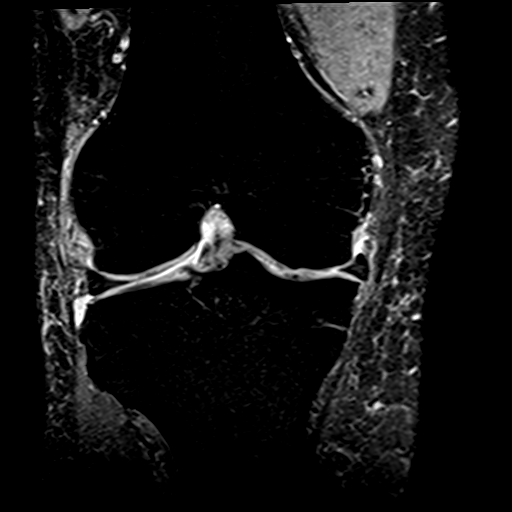
[im 20/28]
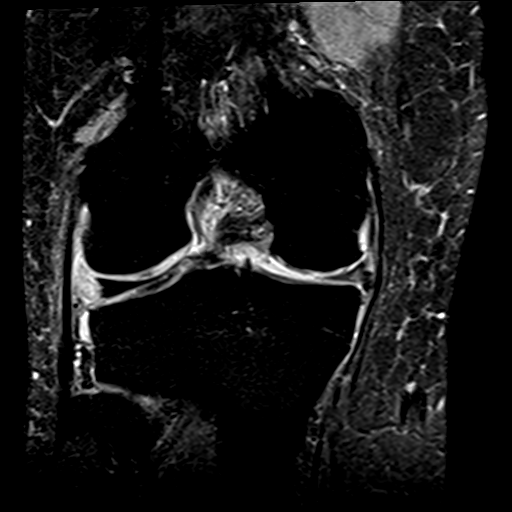
[im 24/28]
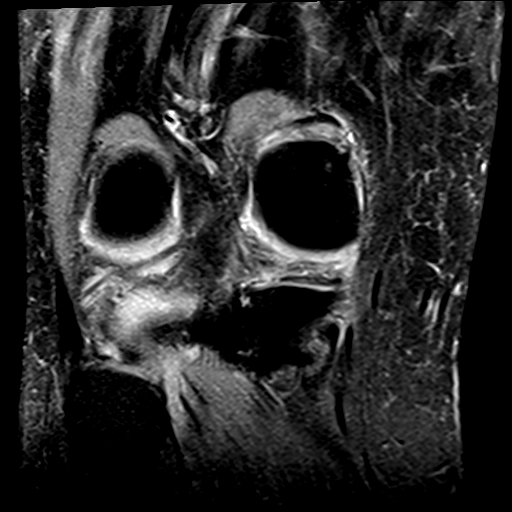
[im 28/28]
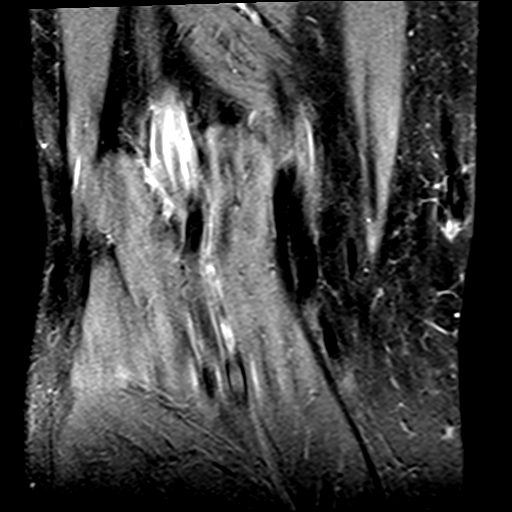

[Series 7: PD fat-sat · sagittal · 4.0mm · 0.31mm/px · 3 of 21 slices shown (3 of 3)]
[im 5/21]
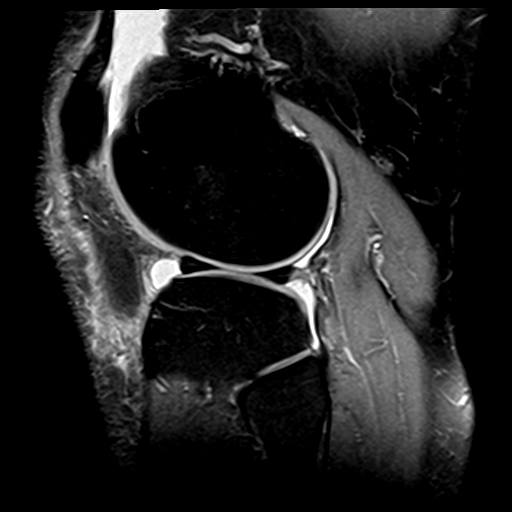
[im 13/21]
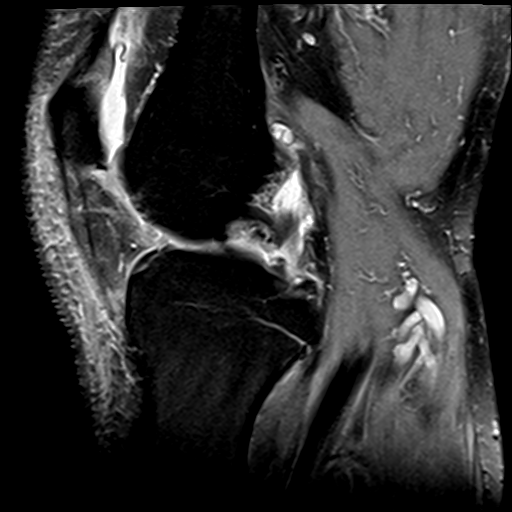
[im 21/21]
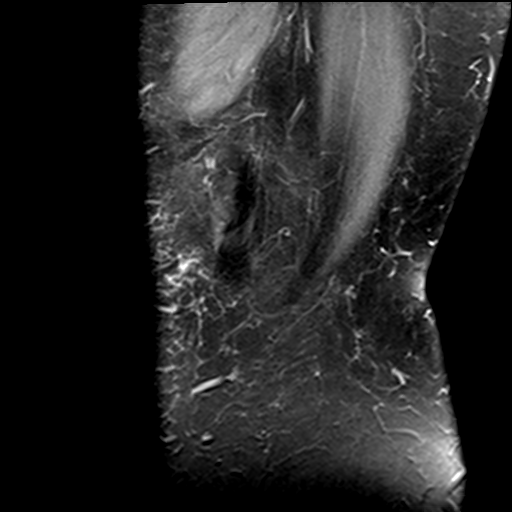

[23 of 40 positions shown; findings below may reference images not displayed]

FINDINGS: MENISCI

Medial meniscus: Complex tear of the posterior horn of the medial
meniscus extending towards the body.

Lateral meniscus:  Intact.

LIGAMENTS

Cruciates:  Intact ACL and PCL.

Collaterals: Medial collateral ligament is intact. Lateral
collateral ligament complex is intact.

CARTILAGE

Patellofemoral: High-grade partial-thickness cartilage loss of the
lateral patellar facet with subchondral cystic changes and marrow
edema. Partial-thickness cartilage loss of the trochlear groove.
Partial-thickness cartilage loss of the medial trochlea and medial
patellar facet.

Medial: Partial-thickness cartilage loss of the medial femorotibial
compartment.

Lateral:  No chondral defect.

Joint: Moderate joint effusion. Mild edema in Hoffa's fat. No plical
thickening.

Popliteal Fossa:  No Baker cyst. Intact popliteus tendon.

Extensor Mechanism: Intact quadriceps tendon and patellar tendon.
Intact medial and lateral patellar retinaculum. Intact MPFL.

Bones:  No marrow signal abnormality.  No fracture or dislocation.

Other: No fluid collection or hematoma.
IMPRESSION: 1. Complex tear of the posterior horn of the medial meniscus
extending towards the body.
2. Cartilage abnormalities of the patellofemoral compartment and
medial femorotibial compartment as described above.
3. Moderate joint effusion.

## 2019-05-30 ENCOUNTER — Other Ambulatory Visit: Payer: Self-pay

## 2019-05-30 ENCOUNTER — Ambulatory Visit (INDEPENDENT_AMBULATORY_CARE_PROVIDER_SITE_OTHER): Payer: 59 | Admitting: Family Medicine

## 2019-05-30 ENCOUNTER — Encounter: Payer: Self-pay | Admitting: Family Medicine

## 2019-05-30 VITALS — BP 150/90 | HR 60 | Temp 98.0°F | Wt 249.4 lb

## 2019-05-30 DIAGNOSIS — E782 Mixed hyperlipidemia: Secondary | ICD-10-CM

## 2019-05-30 DIAGNOSIS — I2510 Atherosclerotic heart disease of native coronary artery without angina pectoris: Secondary | ICD-10-CM

## 2019-05-30 DIAGNOSIS — Z6836 Body mass index (BMI) 36.0-36.9, adult: Secondary | ICD-10-CM

## 2019-05-30 DIAGNOSIS — Z0000 Encounter for general adult medical examination without abnormal findings: Secondary | ICD-10-CM | POA: Diagnosis not present

## 2019-05-30 DIAGNOSIS — N138 Other obstructive and reflux uropathy: Secondary | ICD-10-CM

## 2019-05-30 DIAGNOSIS — N401 Enlarged prostate with lower urinary tract symptoms: Secondary | ICD-10-CM

## 2019-05-30 DIAGNOSIS — Z125 Encounter for screening for malignant neoplasm of prostate: Secondary | ICD-10-CM

## 2019-05-30 DIAGNOSIS — I10 Essential (primary) hypertension: Secondary | ICD-10-CM

## 2019-05-30 NOTE — Patient Instructions (Addendum)
You are overdue for cardiology appointment - please call and schedule that appointment with Dr. Meda Coffee.  Call urologist Dr. Karsten Ro for appointment to discuss prostate symptoms and med options.  Blood pressure is elevated in the office, but based on your normal home readings I suspect you may have a component of whitecoat hypertension.  Bring your home blood pressure machine to your office visit with cardiology to verify that you are obtaining accurate readings. No medication changes for now.  Follow-up in 6 months. Return to the clinic or go to the nearest emergency room if any of your symptoms worsen or new symptoms occur.   Preventive Care 69 Years and Older, Male Preventive care refers to lifestyle choices and visits with your health care provider that can promote health and wellness. This includes:  A yearly physical exam. This is also called an annual well check.  Regular dental and eye exams.  Immunizations.  Screening for certain conditions.  Healthy lifestyle choices, such as diet and exercise. What can I expect for my preventive care visit? Physical exam Your health care provider will check:  Height and weight. These may be used to calculate body mass index (BMI), which is a measurement that tells if you are at a healthy weight.  Heart rate and blood pressure.  Your skin for abnormal spots. Counseling Your health care provider may ask you questions about:  Alcohol, tobacco, and drug use.  Emotional well-being.  Home and relationship well-being.  Sexual activity.  Eating habits.  History of falls.  Memory and ability to understand (cognition).  Work and work Statistician. What immunizations do I need?  Influenza (flu) vaccine  This is recommended every year. Tetanus, diphtheria, and pertussis (Tdap) vaccine  You may need a Td booster every 10 years. Varicella (chickenpox) vaccine  You may need this vaccine if you have not already been  vaccinated. Zoster (shingles) vaccine  You may need this after age 21. Pneumococcal conjugate (PCV13) vaccine  One dose is recommended after age 62. Pneumococcal polysaccharide (PPSV23) vaccine  One dose is recommended after age 55. Measles, mumps, and rubella (MMR) vaccine  You may need at least one dose of MMR if you were born in 1957 or later. You may also need a second dose. Meningococcal conjugate (MenACWY) vaccine  You may need this if you have certain conditions. Hepatitis A vaccine  You may need this if you have certain conditions or if you travel or work in places where you may be exposed to hepatitis A. Hepatitis B vaccine  You may need this if you have certain conditions or if you travel or work in places where you may be exposed to hepatitis B. Haemophilus influenzae type b (Hib) vaccine  You may need this if you have certain conditions. You may receive vaccines as individual doses or as more than one vaccine together in one shot (combination vaccines). Talk with your health care provider about the risks and benefits of combination vaccines. What tests do I need? Blood tests  Lipid and cholesterol levels. These may be checked every 5 years, or more frequently depending on your overall health.  Hepatitis C test.  Hepatitis B test. Screening  Lung cancer screening. You may have this screening every year starting at age 50 if you have a 30-pack-year history of smoking and currently smoke or have quit within the past 15 years.  Colorectal cancer screening. All adults should have this screening starting at age 31 and continuing until age 47. Your health  care provider may recommend screening at age 69 if you are at increased risk. You will have tests every 1-10 years, depending on your results and the type of screening test.  Prostate cancer screening. Recommendations will vary depending on your family history and other risks.  Diabetes screening. This is done by  checking your blood sugar (glucose) after you have not eaten for a while (fasting). You may have this done every 1-3 years.  Abdominal aortic aneurysm (AAA) screening. You may need this if you are a current or former smoker.  Sexually transmitted disease (STD) testing. Follow these instructions at home: Eating and drinking  Eat a diet that includes fresh fruits and vegetables, whole grains, lean protein, and low-fat dairy products. Limit your intake of foods with high amounts of sugar, saturated fats, and salt.  Take vitamin and mineral supplements as recommended by your health care provider.  Do not drink alcohol if your health care provider tells you not to drink.  If you drink alcohol: ? Limit how much you have to 0-2 drinks a day. ? Be aware of how much alcohol is in your drink. In the [place] one drink equals one 12 oz bottle of beer (355 mL), one 5 oz glass of wine (148 mL), or one 1 oz glass of hard liquor (44 mL). Lifestyle  Take daily care of your teeth and gums.  Stay active. Exercise for at least 30 minutes on 5 or more days each week.  Do not use any products that contain nicotine or tobacco, such as cigarettes, e-cigarettes, and chewing tobacco. If you need help quitting, ask your health care provider.  If you are sexually active, practice safe sex. Use a condom or other form of protection to prevent STIs (sexually transmitted infections).  Talk with your health care provider about taking a low-dose aspirin or statin. What's next?  Visit your health care provider once a year for a well check visit.  Ask your health care provider how often you should have your eyes and teeth checked.  Stay up to date on all vaccines. This information is not intended to replace advice given to you by your health care provider. Make sure you discuss any questions you have with your health care provider. Document Released: 06/27/2015 Document Revised: 05/25/2018 Document Reviewed:  05/25/2018 Elsevier Patient Education  [employer].    If you have lab work done today you will be contacted with your lab results within the next 2 weeks.  If you have not heard from [place] then please contact us. The fastest way to get your results is to register for My Chart.   IF you received an x-ray today, you will receive an invoice from [facility]. Please contact [facility] at [phone] with questions or concerns regarding your invoice.   IF you received labwork today, you will receive an invoice from [facility]. Please contact [facility] at [phone] with questions or concerns regarding your invoice.   Our billing staff will not be able to assist you with questions regarding bills from these companies.  You will be contacted with the lab results as soon as they are available. The fastest way to get your results is to activate your My Chart account. Instructions are located on the last page of this paperwork. If you have not heard from [place] regarding the results in 2 weeks, please contact this office.

## 2019-05-30 NOTE — Progress Notes (Signed)
Subjective:  Patient ID: Johnny Wilcox, male    DOB: 10/11/1950  Age: 68 y.o. MRN: [id number]  CC:  Chief Complaint  Patient presents with  . Annual Exam    annual exam with no other issues. Patient also have a physical form that need to be signed for insurance purpose   Care team: Cardiology, Dr. Meda Coffee ENT, Dr. Trenton Founds, Dr. Berenice Primas  HPI Johnny Wilcox presents for  Annual physical exam/wellness exam.  New patient to me to establish care. Last physical with Dr. Brigitte Pulse November 2019.  Hypertension, with history of CAD Currently on lisinopril HCTZ 20/25 mg daily, aspirin 81 mg daily. Evaluate in 2015 chest pain, abnormal EKG, positive stress testing.  PCI to RCA on 09/21/2013, treated with aspirin and Brilinta, cardiology, Dr. Meda Coffee.  Last visit in 2016.  Preop evaluation with cardiology in August 2018. No chest pains, no bleeding. On ASA 32m qd.  Taking meds daily. No recent missed doses - rare missed dose.  No side effects.  Home readings: 120-25/65-85. Last checked 3 weeks ago. Verified accuracy of home readings. dtr and granddaughter are paramedics - they have checked readings manually as above as well - controlled.  BP Readings from Last 3 Encounters:  05/30/19 (!) 150/90  09/10/18 (!) 162/87  09/06/18 (!) 150/82   Lab Results  Component Value Date   CREATININE 1.10 04/26/2018    Hyperlipidemia: Lipitor 80 mg daily.  History of CAD as above. No new myalgias/side effects.  Lab Results  Component Value Date   CHOL 96 (L) 04/26/2018   HDL 32 (L) 04/26/2018   LDLCALC 36 04/26/2018   TRIG 139 04/26/2018   CHOLHDL 3.0 04/26/2018   Lab Results  Component Value Date   ALT 19 04/26/2018   AST 20 04/26/2018   ALKPHOS 68 04/26/2018   BILITOT 0.5 04/26/2018   Cancer screening Colonoscopy 05/28/2015, repeat 5 years. Prostate: Normal, 0.7 in November 2019.  After R/B discussion - chose to have PSA and DRE (will wait for this at urology office)  Has taken finasteride  for enlarged prostate - no change in symptoms - off meds for 3 weeks. Weak stream, incomplete emptying. Nocturia - min to none. Would like to meet with urologist again - Dr .OKarsten Ro 02/2018 appt.   Immunization History  Administered Date(s) Administered  . Fluad Quad(high Dose 65+) 05/09/2019  . Influenza Split 03/22/2012  . Influenza,inj,Quad PF,6+ Mos 03/20/2014, 03/12/2015  . Influenza-Unspecified 03/21/2016, 04/18/2017  . Pneumococcal Conjugate-13 03/20/2014  . Pneumococcal Polysaccharide-23 03/22/2012, 06/04/2017  . Tdap 10/12/2006, 05/20/2016  . Zoster 06/14/2010  Shingrix - [facility] at [facility].   Fall Risk  05/30/2019 09/06/2018 04/26/2018 08/11/2017 06/04/2017  Falls in the past year? 0 0 0 Yes Yes  Number falls in past yr: 0 - 0 2 or more 2 or more  Injury with Fall? 0 - 0 No No  Follow up Falls evaluation completed Falls evaluation completed - - -  Loose rugs - none  lighting at home - adequate.  Stairs -with work and lAcademic librarian    [facility] 05/30/2019 09/06/2018 08/11/2017 06/04/2017 02/03/2017  Decreased Interest 0 0 0 0 0  Down, Depressed, Hopeless 0 0 0 0 0  PHQ - 2 Score 0 0 0 0 0   Functional Status Survey: Is the patient deaf or have difficulty hearing?: No Does the patient have difficulty seeing, even when wearing glasses/contacts?: No Does the patient have difficulty concentrating, remembering, or making  decisions?: No Does the patient have difficulty walking or climbing stairs?: No Does the patient have difficulty dressing or bathing?: No Does the patient have difficulty doing errands alone such as visiting a doctor's office or shopping?: No  Memory screen 6CIT Screen 05/30/2019  What Year? 0 points  What month? 0 points  What time? 0 points  Count back from 20 0 points  Months in reverse 0 points  Repeat phrase 0 points  Total Score 0    Hearing Screening   _0  _1  _2  _3  _4  _5  _6  _7  _8   Right ear:            Left ear:             Visual Acuity Screening   Right eye Left eye Both eyes  Without correction:     With correction: _9  optho - about a year ago - appt in January.     Office Visit from 05/30/2019 in Primary Care at [facility]  AUDIT-C Score  3     Dental  - has full dentures. No recent visit.    Exercise/obesity. Body mass index is 36.83 kg/m.  - no intent to lose weight at this time, risks discussed, declined nutrition/wt loss specialist.   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   Advanced directives  - has livng will HCPOA.    History Patient Active Problem List   Diagnosis Date Noted  . Chondromalacia, right knee 02/18/2017  . Hearing loss at 3000 bilat 03/2014 03/20/2014  . CTS (carpal tunnel syndrome)-bilat 2015 03/20/2014  . Full dentures 03/20/2014  . CAD (coronary artery disease) 10/04/2013  . Angina pectoris ([place]) 09/21/2013  . ED (erectile dysfunction) 04/04/2012  . HTN (hypertension) 03/22/2012  . BMI 34.0-34.9,adult 03/22/2012  . Diverticulosis Colonoscopy 2003 03/22/2012  . BPH (benign prostatic hyperplasia)-mild July 2003 03/22/2012  . Hyperlipidemia-Mild 03/22/2012   Past Medical History:  Diagnosis Date  . CAD in native artery    a. abnormal nuc 2015 - s/p DES to RCA, minimal disease in prox LAD, EF 55-65%.  . Cataract    removed from left eye  . ED (erectile dysfunction)   . Hyperlipidemia   . Hypertension    Past Surgical History:  Procedure Laterality Date  . CATARACT EXTRACTION Right   . COLONOSCOPY    . CORONARY STENT PLACEMENT    . EYE SURGERY     child injury-repair.    [name] KNEE ARTHROSCOPY Right 02/18/2017   Procedure: ARTHROSCOPY RIGHT KNEE WITH PARTIAL MEDIAL MENISECTOMY, MEDIAL CHONDROPLASTY AND PATELLA, AND MEDIAL PLICO EXCISION;  Surgeon: [name], MD;  Location: [facility];  Service: Orthopedics;  Laterality: Right;  . LEFT HEART CATHETERIZATION WITH CORONARY ANGIOGRAM N/A  09/21/2013   Procedure: LEFT HEART CATHETERIZATION WITH CORONARY ANGIOGRAM;  Surgeon: [name], MD;  Location: [facility] CATH LAB;  Service: Cardiovascular;  Laterality: N/A;  . MULTIPLE EXTRACTIONS WITH ALVEOLOPLASTY  12/06/2011   Procedure: MULTIPLE EXTRACION WITH ALVEOLOPLASTY;  Surgeon: [name], DDS;  Location: [place];  Service: Oral Surgery;  Laterality: Bilateral;  Biopsy tissue right vestibule  . PERCUTANEOUS CORONARY STENT INTERVENTION (PCI-S)  09/21/2013   Procedure: PERCUTANEOUS CORONARY STENT INTERVENTION (PCI-S);  Surgeon: [name], MD;  Location: [facility] CATH LAB;  Service: Cardiovascular;;  . VASECTOMY     No Known Allergies Prior to Admission medications   Medication Sig Start Date End Date Taking? Authorizing Provider  aspirin EC 81 MG tablet Take  81 mg by mouth daily.   Yes [provider]  atorvastatin (LIPITOR) 80 MG tablet Take 1 tablet (80 mg total) by mouth daily. 04/26/18  Yes Shawnee Knapp, MD  lisinopril-hydrochlorothiazide (PRINZIDE,ZESTORETIC) 20-25 MG tablet Take 1 tablet by mouth daily. 04/26/18  Yes Shawnee Knapp, MD  finasteride (PROSCAR) 5 MG tablet Take 1 tablet (5 mg total) by mouth daily. Patient not taking: Reported on 05/30/2019 04/26/18   Shawnee Knapp, MD   Social History   Socioeconomic History  . Marital status: Married    Spouse name: Not on file  . Number of children: Not on file  . Years of education: Not on file  . Highest education level: Not on file  Occupational History  . Not on file  Tobacco Use  . Smoking status: Never Smoker  . Smokeless tobacco: Current User    Types: Chew  Substance and Sexual Activity  . Alcohol use: Yes    Comment:  maybe 3 beers a month  . Drug use: No  . Sexual activity: Yes  Other Topics Concern  . Not on file  Social History Narrative  . Not on file   Social Determinants of Health   Financial Resource Strain:   . Difficulty of Paying Living Expenses: Not on file  Food Insecurity:   .  Worried About Charity fundraiser in the Last Year: Not on file  . Ran Out of Food in the Last Year: Not on file  Transportation Needs:   . Lack of Transportation (Medical): Not on file  . Lack of Transportation (Non-Medical): Not on file  Physical Activity:   . Days of Exercise per Week: Not on file  . Minutes of Exercise per Session: Not on file  Stress:   . Feeling of Stress : Not on file  Social Connections:   . Frequency of Communication with Friends and Family: Not on file  . Frequency of Social Gatherings with Friends and Family: Not on file  . Attends Religious Services: Not on file  . Active Member of Clubs or Organizations: Not on file  . Attends Archivist Meetings: Not on file  . Marital Status: Not on file  Intimate Partner Violence:   . Fear of Current or Ex-Partner: Not on file  . Emotionally Abused: Not on file  . Physically Abused: Not on file  . Sexually Abused: Not on file    Review of Systems  Constitutional: Negative for fatigue and unexpected weight change.  Eyes: Negative for visual disturbance.  Respiratory: Negative for cough, chest tightness and shortness of breath.   Cardiovascular: Negative for chest pain, palpitations and leg swelling.  Gastrointestinal: Negative for abdominal pain and blood in stool.  Neurological: Negative for dizziness, light-headedness and headaches.   13 point review of systems per patient health survey noted.  Negative other than as indicated above or in HPI.    Objective:   Vitals:   05/30/19 1343 05/30/19 1344  BP: (!) 163/94 (!) 150/90  Pulse: 60   Temp: 98 F (36.7 C)   TempSrc: Temporal   SpO2: 98%   Weight: 249 lb 6.4 oz (113.1 kg)      Physical Exam Vitals reviewed.  Constitutional:      Appearance: He is well-developed.  HENT:     Head: Normocephalic and atraumatic.     Right Ear: External ear normal.     Left Ear: External ear normal.  Eyes:     Conjunctiva/sclera: Conjunctivae  normal.      Pupils: Pupils are equal, round, and reactive to light.  Neck:     Thyroid: No thyromegaly.  Cardiovascular:     Rate and Rhythm: Normal rate and regular rhythm.     Heart sounds: Normal heart sounds.  Pulmonary:     Effort: Pulmonary effort is normal. No respiratory distress.     Breath sounds: Normal breath sounds. No wheezing.  Abdominal:     General: There is no distension.     Palpations: Abdomen is soft.     Tenderness: There is no abdominal tenderness.  Musculoskeletal:        General: No tenderness. Normal range of motion.     Cervical back: Normal range of motion and neck supple.  Lymphadenopathy:     Cervical: No cervical adenopathy.  Skin:    General: Skin is warm and dry.  Neurological:     Mental Status: He is alert and oriented to person, place, and time.     Deep Tendon Reflexes: Reflexes are normal and symmetric.  Psychiatric:        Behavior: Behavior normal.    Assessment & Plan:  Johnny Wilcox is a 68 y.o. male . Medicare annual wellness visit, subsequent  - - anticipatory guidance as below in AVS, screening labs if needed. Health maintenance items as above in HPI discussed/recommended as applicable.  - no concerning responses on depression, fall, or functional status screening. Any positive responses noted as above. Advanced directives discussed as in CHL.  -Fall precautions reviewed, recommended against working from heights/ladders or being very careful in doing so. -Obesity/elevated BMI discussed.  Declines any intervention for weight loss at this time.  Essential hypertension  -Elevated in office, suspected whitecoat hypertension based on home readings and reported manual BPs at home also normal.  Advised to bring monitor to cardiology visit.  Overdue for that visit and plans to schedule.  No med changes for now.  Mixed hyperlipidemia  -Tolerating high-dose statin.  Check labs  Coronary artery disease involving native coronary artery of native heart  without angina pectoris  -Follow-up with cardiology, asymptomatic at present.  Continue aspirin, statin  BPH with lower urinary symptoms.  Minimal change with finasteride.  Decided to not restart, plans to follow-up with urology.  Plan on DRE at that visit.  No orders of the defined types were placed in this encounter.  Patient Instructions   You are overdue for cardiology appointment - please call and schedule that appointment with Dr. Meda Coffee.  Call urologist Dr. Karsten Ro for appointment to discuss prostate symptoms and med options.  Blood pressure is elevated in the office, but based on your normal home readings I suspect you may have a component of whitecoat hypertension.  Bring your home blood pressure machine to your office visit with cardiology to verify that you are obtaining accurate readings. No medication changes for now.  Follow-up in 6 months. Return to the clinic or go to the nearest emergency room if any of your symptoms worsen or new symptoms occur.   Preventive Care 69 Years and Older, Male Preventive care refers to lifestyle choices and visits with your health care provider that can promote health and wellness. This includes:  A yearly physical exam. This is also called an annual well check.  Regular dental and eye exams.  Immunizations.  Screening for certain conditions.  Healthy lifestyle choices, such as diet and exercise. What can I expect for my preventive care visit? Physical exam Your  health care provider will check:  Height and weight. These may be used to calculate body mass index (BMI), which is a measurement that tells if you are at a healthy weight.  Heart rate and blood pressure.  Your skin for abnormal spots. Counseling Your health care provider may ask you questions about:  Alcohol, tobacco, and drug use.  Emotional well-being.  Home and relationship well-being.  Sexual activity.  Eating habits.  History of falls.  Memory and ability  to understand (cognition).  Work and work Statistician. What immunizations do I need?  Influenza (flu) vaccine  This is recommended every year. Tetanus, diphtheria, and pertussis (Tdap) vaccine  You may need a Td booster every 10 years. Varicella (chickenpox) vaccine  You may need this vaccine if you have not already been vaccinated. Zoster (shingles) vaccine  You may need this after age 58. Pneumococcal conjugate (PCV13) vaccine  One dose is recommended after age 34. Pneumococcal polysaccharide (PPSV23) vaccine  One dose is recommended after age 70. Measles, mumps, and rubella (MMR) vaccine  You may need at least one dose of MMR if you were born in 1957 or later. You may also need a second dose. Meningococcal conjugate (MenACWY) vaccine  You may need this if you have certain conditions. Hepatitis A vaccine  You may need this if you have certain conditions or if you travel or work in places where you may be exposed to hepatitis A. Hepatitis B vaccine  You may need this if you have certain conditions or if you travel or work in places where you may be exposed to hepatitis B. Haemophilus influenzae type b (Hib) vaccine  You may need this if you have certain conditions. You may receive vaccines as individual doses or as more than one vaccine together in one shot (combination vaccines). Talk with your health care provider about the risks and benefits of combination vaccines. What tests do I need? Blood tests  Lipid and cholesterol levels. These may be checked every 5 years, or more frequently depending on your overall health.  Hepatitis C test.  Hepatitis B test. Screening  Lung cancer screening. You may have this screening every year starting at age 66 if you have a 30-pack-year history of smoking and currently smoke or have quit within the past 15 years.  Colorectal cancer screening. All adults should have this screening starting at age 1 and continuing until age 24.  Your health care provider may recommend screening at age 75 if you are at increased risk. You will have tests every 1-10 years, depending on your results and the type of screening test.  Prostate cancer screening. Recommendations will vary depending on your family history and other risks.  Diabetes screening. This is done by checking your blood sugar (glucose) after you have not eaten for a while (fasting). You may have this done every 1-3 years.  Abdominal aortic aneurysm (AAA) screening. You may need this if you are a current or former smoker.  Sexually transmitted disease (STD) testing. Follow these instructions at home: Eating and drinking  Eat a diet that includes fresh fruits and vegetables, whole grains, lean protein, and low-fat dairy products. Limit your intake of foods with high amounts of sugar, saturated fats, and salt.  Take vitamin and mineral supplements as recommended by your health care provider.  Do not drink alcohol if your health care provider tells you not to drink.  If you drink alcohol: ? Limit how much you have to 0-2 drinks a day. ?  Be aware of how much alcohol is in your drink. In the [place] one drink equals one 12 oz bottle of beer (355 mL), one 5 oz glass of wine (148 mL), or one 1 oz glass of hard liquor (44 mL). Lifestyle  Take daily care of your teeth and gums.  Stay active. Exercise for at least 30 minutes on 5 or more days each week.  Do not use any products that contain nicotine or tobacco, such as cigarettes, e-cigarettes, and chewing tobacco. If you need help quitting, ask your health care provider.  If you are sexually active, practice safe sex. Use a condom or other form of protection to prevent STIs (sexually transmitted infections).  Talk with your health care provider about taking a low-dose aspirin or statin. What's next?  Visit your health care provider once a year for a well check visit.  Ask your health care provider how often you  should have your eyes and teeth checked.  Stay up to date on all vaccines. This information is not intended to replace advice given to you by your health care provider. Make sure you discuss any questions you have with your health care provider. Document Released: 06/27/2015 Document Revised: 05/25/2018 Document Reviewed: 05/25/2018 Elsevier Patient Education  [employer].    If you have lab work done today you will be contacted with your lab results within the next 2 weeks.  If you have not heard from [place] then please contact us. The fastest way to get your results is to register for My Chart.   IF you received an x-ray today, you will receive an invoice from [facility]. Please contact [facility] at [phone] with questions or concerns regarding your invoice.   IF you received labwork today, you will receive an invoice from [facility]. Please contact [facility] at [phone] with questions or concerns regarding your invoice.   Our billing staff will not be able to assist you with questions regarding bills from these companies.  You will be contacted with the lab results as soon as they are available. The fastest way to get your results is to activate your My Chart account. Instructions are located on the last page of this paperwork. If you have not heard from [place] regarding the results in 2 weeks, please contact this office.         Signed, Merri Ray, MD [facility]

## 2019-05-31 LAB — LIPID PANEL
Chol/HDL Ratio: 2.9 ratio (ref 0.0–5.0)
Cholesterol, Total: 106 mg/dL (ref 100–199)
HDL: 37 mg/dL — ABNORMAL LOW (ref >39)
LDL Chol Calc (NIH): 40 mg/dL (ref 0–99)
Triglycerides: 174 mg/dL — ABNORMAL HIGH (ref 0–149)
VLDL Cholesterol Cal: 29 mg/dL (ref 5–40)

## 2019-05-31 LAB — COMPREHENSIVE METABOLIC PANEL
ALT: 24 IU/L (ref 0–44)
AST: 20 IU/L (ref 0–40)
Albumin/Globulin Ratio: 2 (ref 1.2–2.2)
Albumin: 4.5 g/dL (ref 3.8–4.8)
Alkaline Phosphatase: 74 IU/L (ref 39–117)
BUN/Creatinine Ratio: 18 (ref 10–24)
BUN: 17 mg/dL (ref 8–27)
Bilirubin Total: 0.4 mg/dL (ref 0.0–1.2)
CO2: 24 mmol/L (ref 20–29)
Calcium: 9.2 mg/dL (ref 8.6–10.2)
Chloride: 102 mmol/L (ref 96–106)
Creatinine, Ser: 0.93 mg/dL (ref 0.76–1.27)
GFR calc Af Amer: 96 mL/min/{1.73_m2} (ref >59)
GFR calc non Af Amer: 83 mL/min/{1.73_m2} (ref >59)
Globulin, Total: 2.3 g/dL (ref 1.5–4.5)
Glucose: 92 mg/dL (ref 65–99)
Potassium: 3.9 mmol/L (ref 3.5–5.2)
Sodium: 141 mmol/L (ref 134–144)
Total Protein: 6.8 g/dL (ref 6.0–8.5)

## 2019-05-31 LAB — PSA: Prostate Specific Ag, Serum: 0.5 ng/mL (ref 0.0–4.0)

## 2019-07-25 ENCOUNTER — Other Ambulatory Visit: Payer: Self-pay | Admitting: Family Medicine

## 2019-07-25 MED ORDER — LISINOPRIL-HYDROCHLOROTHIAZIDE 20-25 MG PO TABS: 1.0000 | ORAL_TABLET | Freq: Every day | ORAL | 4 refills | Status: DC

## 2019-07-25 MED ORDER — ATORVASTATIN CALCIUM 80 MG PO TABS: 80.0000 mg | ORAL_TABLET | Freq: Every day | ORAL | 4 refills | Status: DC

## 2019-07-25 NOTE — Telephone Encounter (Signed)
Requested Prescriptions  Pending Prescriptions Disp Refills  . lisinopril-hydrochlorothiazide (ZESTORETIC) 20-25 MG tablet 90 tablet 4    Sig: Take 1 tablet by mouth daily.     Cardiovascular:  ACEI + Diuretic Combos Failed - 07/25/2019  1:14 PM      Failed - Last BP in normal range    BP Readings from Last 1 Encounters:  05/30/19 (!) 150/90         Passed - Na in normal range and within 180 days    Sodium  Date Value Ref Range Status  05/30/2019 141 134 - 144 mmol/L Final         Passed - K in normal range and within 180 days    Potassium  Date Value Ref Range Status  05/30/2019 3.9 3.5 - 5.2 mmol/L Final         Passed - Cr in normal range and within 180 days    Creat  Date Value Ref Range Status  08/05/2013 0.94 0.50 - 1.35 mg/dL Final   Creatinine, Ser  Date Value Ref Range Status  05/30/2019 0.93 0.76 - 1.27 mg/dL Final         Passed - Ca in normal range and within 180 days    Calcium  Date Value Ref Range Status  05/30/2019 9.2 8.6 - 10.2 mg/dL Final         Passed - Patient is not pregnant      Passed - Valid encounter within last 6 months    Recent Outpatient Visits          1 month ago Medicare annual wellness visit, subsequent   Primary [facility] at [place], MD   10 months ago Acute pain of left shoulder   Primary Care at [facility], Ines Bloomer, MD   1 year ago Annual physical exam   [facility] at [facility], Laurey Arrow, MD   1 year ago Left buttock pain   [facility] at [facility], Laurey Arrow, MD   2 years ago Mixed hyperlipidemia   [facility] at [facility], Laurey Arrow, MD      Future Appointments            In 4 months Carlota Raspberry Ranell Patrick, MD Primary Care at [place], [facility]           . atorvastatin (LIPITOR) 80 MG tablet 90 tablet 4    Sig: Take 1 tablet (80 mg total) by mouth daily.     Cardiovascular:  Antilipid - Statins Failed - 07/25/2019  1:14 PM      Failed - LDL in normal range and within 360 days    LDL Chol Calc (NIH)   Date Value Ref Range Status  05/30/2019 40 0 - 99 mg/dL Final         Failed - HDL in normal range and within 360 days    HDL  Date Value Ref Range Status  05/30/2019 37 (L) >39 mg/dL Final         Failed - Triglycerides in normal range and within 360 days    Triglycerides  Date Value Ref Range Status  05/30/2019 174 (H) 0 - 149 mg/dL Final         Passed - Total Cholesterol in normal range and within 360 days    Cholesterol, Total  Date Value Ref Range Status  05/30/2019 106 100 - 199 mg/dL Final         Passed - Patient is not  pregnant      Passed - Valid encounter within last 12 months    Recent Outpatient Visits          1 month ago Medicare annual wellness visit, subsequent   [facility] at [facility], Ranell Patrick, MD   10 months ago Acute pain of left shoulder   Primary Care at [facility], Ines Bloomer, MD   1 year ago Annual physical exam   [facility] at [facility], Laurey Arrow, MD   1 year ago Left buttock pain   [facility] at [facility], Laurey Arrow, MD   2 years ago Mixed hyperlipidemia   [facility] at [facility], Laurey Arrow, MD      Future Appointments            In 4 months Carlota Raspberry Ranell Patrick, MD Primary Care at [place], [facility]

## 2019-07-25 NOTE — Telephone Encounter (Signed)
Pt request refill  atorvastatin (LIPITOR) 80 MG tablet  lisinopril-hydrochlorothiazide (PRINZIDE,ZESTORETIC) 20-25 MG tablet  90 day  [facility] - [place], [place] - [address] AT [place] Phone:  [phone]  Fax:  [phone]

## 2019-08-04 ENCOUNTER — Ambulatory Visit: Payer: Self-pay | Attending: Internal Medicine

## 2019-08-04 DIAGNOSIS — Z23 Encounter for immunization: Secondary | ICD-10-CM | POA: Insufficient documentation

## 2019-08-04 NOTE — Progress Notes (Signed)
   Covid-19 Vaccination Clinic  Name:  Johnny Wilcox    MRN: [id number] DOB: Nov 01, 1950  08/04/2019  Mr. Mcguiness was observed post Covid-19 immunization for 15 minutes without incidence. He was provided with Vaccine Information Sheet and instruction to access the V-Safe system.   Mr. Wroblewski was instructed to call 911 with any severe reactions post vaccine: [name] Difficulty breathing  . Swelling of your face and throat  . A fast heartbeat  . A bad rash all over your body  . Dizziness and weakness    Immunizations Administered    Name Date Dose VIS Date Route   Pfizer COVID-19 Vaccine 08/04/2019  9:08 AM 0.3 mL 05/25/2019 Intramuscular   Manufacturer: [place]   Lot: [id number]   [place]: [id number]

## 2019-08-29 ENCOUNTER — Ambulatory Visit: Payer: Self-pay | Attending: Internal Medicine

## 2019-08-29 DIAGNOSIS — Z23 Encounter for immunization: Secondary | ICD-10-CM

## 2019-08-29 NOTE — Progress Notes (Signed)
   Covid-19 Vaccination Clinic  Name:  ADEBAYO HOLTER    MRN: [id number] DOB: [birth year]  08/29/2019  Mr. Giebel was observed post Covid-19 immunization for 15 minutes without incident. He was provided with Vaccine Information Sheet and instruction to access the V-Safe system.   Mr. Hommerding was instructed to call 911 with any severe reactions post vaccine: [name] Difficulty breathing  . Swelling of face and throat  . A fast heartbeat  . A bad rash all over body  . Dizziness and weakness   Immunizations Administered    Name Date Dose VIS Date Route   Pfizer COVID-19 Vaccine 08/29/2019  4:38 PM 0.3 mL 05/25/2019 Intramuscular   Manufacturer: [place]   Lot: [id number]   [place]: [id number]

## 2019-10-19 ENCOUNTER — Other Ambulatory Visit: Payer: Self-pay | Admitting: Family Medicine

## 2019-10-19 MED ORDER — ATORVASTATIN CALCIUM 80 MG PO TABS: 80.0000 mg | ORAL_TABLET | Freq: Every day | ORAL | 4 refills | Status: DC

## 2019-10-19 MED ORDER — LISINOPRIL-HYDROCHLOROTHIAZIDE 20-25 MG PO TABS: 1.0000 | ORAL_TABLET | Freq: Every day | ORAL | 4 refills | Status: DC

## 2019-10-19 NOTE — Telephone Encounter (Signed)
Copied from [place] [phone]. Topic: Quick Communication - Rx Refill/Question >> Oct 19, 2019  9:51 AM Yvette Rack wrote: Pt wife Mechele Claude stated their insurance changed and they no longer use the mail order pharmacy.  Medication: atorvastatin (LIPITOR) 80 MG tablet AND lisinopril-hydrochlorothiazide (ZESTORETIC) 20-25 MG tablet  Has the patient contacted their pharmacy? yes   Preferred Pharmacy (with phone number or street name): [id number] - [place], [place]  Phone: [phone]   Fax: [phone]  Agent: Please be advised that RX refills may take up to 3 business days. We ask that you follow-up with your pharmacy.

## 2019-10-19 NOTE — Telephone Encounter (Signed)
Requested Prescriptions  Pending Prescriptions Disp Refills  . lisinopril-hydrochlorothiazide (ZESTORETIC) 20-25 MG tablet 90 tablet 4    Sig: Take 1 tablet by mouth daily.     Cardiovascular:  ACEI + Diuretic Combos Failed - 10/19/2019  9:59 AM      Failed - Last BP in normal range    BP Readings from Last 1 Encounters:  05/30/19 (!) 150/90         Passed - Na in normal range and within 180 days    Sodium  Date Value Ref Range Status  05/30/2019 141 134 - 144 mmol/L Final         Passed - K in normal range and within 180 days    Potassium  Date Value Ref Range Status  05/30/2019 3.9 3.5 - 5.2 mmol/L Final         Passed - Cr in normal range and within 180 days    Creat  Date Value Ref Range Status  08/05/2013 0.94 0.50 - 1.35 mg/dL Final   Creatinine, Ser  Date Value Ref Range Status  05/30/2019 0.93 0.76 - 1.27 mg/dL Final         Passed - Ca in normal range and within 180 days    Calcium  Date Value Ref Range Status  05/30/2019 9.2 8.6 - 10.2 mg/dL Final         Passed - Patient is not pregnant      Passed - Valid encounter within last 6 months    Recent Outpatient Visits          4 months ago Medicare annual wellness visit, subsequent   [facility], Ranell Patrick, MD   1 year ago Acute pain of left shoulder   [facility] at [facility], MD   1 year ago Annual physical exam   [facility], Laurey Arrow, MD   2 years ago Left buttock pain   [facility], Laurey Arrow, MD   2 years ago Mixed hyperlipidemia   [facility], Laurey Arrow, MD      Future Appointments            In 1 week Dunn, Nedra Hai, PA-C [place], [facility]   In 1 month Carlota Raspberry, Ranell Patrick, MD [facility] at [place], [facility]           . atorvastatin (LIPITOR) 80 MG tablet 90 tablet 4    Sig: Take 1 tablet (80 mg total) by mouth daily.     Cardiovascular:  Antilipid - Statins Failed - 10/19/2019  9:59 AM     Failed - LDL in normal range and within 360 days    LDL Chol Calc (NIH)  Date Value Ref Range Status  05/30/2019 40 0 - 99 mg/dL Final         Failed - HDL in normal range and within 360 days    HDL  Date Value Ref Range Status  05/30/2019 37 (L) >39 mg/dL Final         Failed - Triglycerides in normal range and within 360 days    Triglycerides  Date Value Ref Range Status  05/30/2019 174 (H) 0 - 149 mg/dL Final         Passed - Total Cholesterol in normal range and within 360 days    Cholesterol, Total  Date Value Ref Range Status  05/30/2019 106 100 - 199 mg/dL Final  Passed - Patient is not pregnant      Passed - Valid encounter within last 12 months    Recent Outpatient Visits          4 months ago Medicare annual wellness visit, subsequent   [facility], Ranell Patrick, MD   1 year ago Acute pain of left shoulder   Primary Care at [facility], Ines Bloomer, MD   1 year ago Annual physical exam   [facility], Laurey Arrow, MD   2 years ago Left buttock pain   [facility], Laurey Arrow, MD   2 years ago Mixed hyperlipidemia   [facility], Laurey Arrow, MD      Future Appointments            In 1 week Dunn, Nedra Hai, PA-C [place], [facility]   In 1 month Carlota Raspberry, Ranell Patrick, MD Primary Care at [place], [facility]

## 2019-10-30 NOTE — Progress Notes (Signed)
Cardiology Office Note    Date:  10/31/2019   ID:  Ranbir, Yohey 02/04/1951, MRN [id number]  PCP:  Wendie Agreste, MD  Cardiologist:  Ena Dawley, MD  Electrophysiologist:  None   Chief Complaint: f/u CAD  History of Present Illness:   Johnny Wilcox is a 70 y.o. male with history of CAD s/p DES to RCA, HTN, hyperlipidemia, ED, chew tobacco, obesity who presents for overdue follow-up. In 2015 he underwent stress testing which was abnormal. This prompted cardiac cath which showed 30% prox LAD, 90% RCA mid RCA s/p DES, EF 55-65%. He was last seen in 2018. Last labs 05/2019 showed trigs 174, HDL 37, LDL 40 (monitored by PCP), normal CMET with 3.9, 04/2018 TSH and CBC wnl.  He returns for follow-up today doing well from a cardiac standpoint. He remains very active doing physical labor. He jokes that he "splits wood for stress relief." He has been under increased stress this year with his daughter falling ill related to severe hypoglycemia and his wife broke her feet. He denies any issues with current medication regimen.   Past Medical History:  Diagnosis Date  . CAD in native artery    a. abnormal nuc 2015 - s/p DES to RCA, minimal disease in prox LAD, EF 55-65%.  . Cataract    removed from left eye  . ED (erectile dysfunction)   . Hyperlipidemia   . Hypertension     Past Surgical History:  Procedure Laterality Date  . CATARACT EXTRACTION Right   . COLONOSCOPY    . CORONARY STENT PLACEMENT    . EYE SURGERY     child injury-repair.    [name] KNEE ARTHROSCOPY Right 02/18/2017   Procedure: ARTHROSCOPY RIGHT KNEE WITH PARTIAL MEDIAL MENISECTOMY, MEDIAL CHONDROPLASTY AND PATELLA, AND MEDIAL PLICO EXCISION;  Surgeon: [name], MD;  Location: [facility];  Service: Orthopedics;  Laterality: Right;  . LEFT HEART CATHETERIZATION WITH CORONARY ANGIOGRAM N/A 09/21/2013   Procedure: LEFT HEART CATHETERIZATION WITH CORONARY ANGIOGRAM;  Surgeon: [name], MD;  Location: [facility] CATH LAB;   Service: Cardiovascular;  Laterality: N/A;  . MULTIPLE EXTRACTIONS WITH ALVEOLOPLASTY  12/06/2011   Procedure: MULTIPLE EXTRACION WITH ALVEOLOPLASTY;  Surgeon: [name], DDS;  Location: [facility];  Service: Oral Surgery;  Laterality: Bilateral;  Biopsy tissue right vestibule  . PERCUTANEOUS CORONARY STENT INTERVENTION (PCI-S)  09/21/2013   Procedure: PERCUTANEOUS CORONARY STENT INTERVENTION (PCI-S);  Surgeon: [name], MD;  Location: [facility] CATH LAB;  Service: Cardiovascular;;  . VASECTOMY      Current Medications: Current Meds  Medication Sig  . aspirin EC 81 MG tablet Take 81 mg by mouth daily.  [name] atorvastatin (LIPITOR) 80 MG tablet Take 1 tablet (80 mg total) by mouth daily.  [name] lisinopril-hydrochlorothiazide (ZESTORETIC) 20-25 MG tablet Take 1 tablet by mouth daily.     Allergies:   Patient has no known allergies.   Social History   Socioeconomic History  . Marital status: Married    Spouse name: Not on file  . Number of children: Not on file  . Years of education: Not on file  . Highest education level: Not on file  Occupational History  . Not on file  Tobacco Use  . Smoking status: Never Smoker  . Smokeless tobacco: Current User    Types: Chew  Substance and Sexual Activity  . Alcohol use: Yes    Comment:  maybe 3 beers a month  . Drug use: No  .  Sexual activity: Yes  Other Topics Concern  . Not on file  Social History Narrative  . Not on file   Social Determinants of Health   Financial Resource Strain:   . Difficulty of Paying Living Expenses:   Food Insecurity:   . Worried About Charity fundraiser in the Last Year:   . Arboriculturist in the Last Year:   Transportation Needs:   . Film/video editor (Medical):   [name] Lack of Transportation (Non-Medical):   Physical Activity:   . Days of Exercise per Week:   . Minutes of Exercise per Session:   Stress:   . Feeling of Stress :   Social Connections:   . Frequency of Communication with Friends and  Family:   . Frequency of Social Gatherings with Friends and Family:   . Attends Religious Services:   . Active Member of Clubs or Organizations:   . Attends Archivist Meetings:   [name] Marital Status:      Family History:  The patient's family history includes Heart disease in his father and mother; Hypertension in his sister; Stroke in his mother. There is no history of Colon cancer, Esophageal cancer, Rectal cancer, or Stomach cancer.  ROS:   Please see the history of present illness.  All other systems are reviewed and otherwise negative.    EKGs/Labs/Other Studies Reviewed:    Studies reviewed are outlined and summarized above. Reports included below if pertinent.  [facility] 09/2013 Cardiac Catheterization Procedure Note  Name: Johnny Wilcox MRN: [id number] DOB: 1951-01-13  Procedure: Left Heart Cath, Selective Coronary Angiography, LV angiography, PTCA and stenting of the mid RCA  Indication:69 yo WM with recent onset of angina class IV. Myoview showed an inferior wall perfusion abnormality.  Procedural Details:  The right wrist was prepped, draped, and anesthetized with 1% lidocaine. Using the modified Seldinger technique, a 6 French slender sheath was introduced into the right radial artery. 3 mg of verapamil was administered through the sheath, weight-based unfractionated heparin was administered intravenously. Standard Judkins catheters were used for selective coronary angiography and left ventriculography. Catheter exchanges were performed over an exchange length guidewire.  PROCEDURAL FINDINGS Hemodynamics: AO 108/57 mean 79 mm Hg LV 105/16 mm Hg              Coronary angiography: Coronary dominance: right  Left mainstem: Normal  Left anterior descending (LAD): There is smooth narrowing of the proximal LAD up to 30%. The first diagonal is a large branch and is normal.  Left circumflex (LCx): Normal.  Right coronary artery (RCA): Inferior takeoff. There  is a 90% stenosis in the mid RCA at the site of takeoff of the RV marginal branch.  Left ventriculography: Left ventricular systolic function is normal, LVEF is estimated at 55-65%, there is no significant mitral regurgitation   PCI Note:  Following the diagnostic procedure, the decision was made to proceed with PCI.  Weight-based heparin was given for anticoagulation. Brilinta 180 mg was given orally. Once a therapeutic ACT was achieved, a 6 [place] MP A1 guide catheter was inserted.  A prowater coronary guidewire was used to cross the lesion.  The lesion was predilated with a 2.5 mm balloon.  The lesion was then stented with a 4.0 x 23 mm Alpine stent.  The stent was postdilated with a 4.0 noncompliant balloon.  Following PCI, there was 0% residual stenosis and TIMI-3 flow. Final angiography confirmed an excellent result. The patient tolerated the procedure well. There  were no immediate procedural complications. A TR band was used for radial hemostasis. The patient was transferred to the post catheterization recovery area for further monitoring.  PCI Data: Vessel - RCA/Segment - mid Percent Stenosis (pre)  90% TIMI-flow 3 Stent 4.0 x 23 mm Alpine stent Percent Stenosis (post) 0% TIMI-flow (post) 3  Final Conclusions:   1. Single vessel obstructive CAD 2. Normal LV function  3. Successful stenting of the mid RCA with a DES.  Recommendations:  Continue dual antiplatelet therapy for one year. Start statin for risk factor modification. Anticipate DC in am.  Ander Slade [facility] 09/21/2013, 10:33 AM    EKG:  EKG is ordered today, personally reviewed, demonstrating NSR 55bpm, no acute STT changes  Recent Labs: 05/30/2019: ALT 24; BUN 17; Creatinine, Ser 0.93; Potassium 3.9; Sodium 141  Recent Lipid Panel    Component Value Date/Time   CHOL 106 05/30/2019 1633   TRIG 174 (H) 05/30/2019 1633   HDL 37 (L) 05/30/2019 1633   CHOLHDL 2.9 05/30/2019 1633   CHOLHDL 3 01/14/2015 0901    VLDL 21.6 01/14/2015 0901   LDLCALC 40 05/30/2019 1633    PHYSICAL EXAM:    VS:  BP 126/74   Pulse 63   Ht 5' 9.5" (1.765 m)   Wt 247 lb (112 kg)   SpO2 97%   BMI 35.95 kg/m   BMI: Body mass index is 35.95 kg/m.  GEN: Well nourished, well developed WM in no acute distress HEENT: normocephalic, atraumatic Neck: no JVD, carotid bruits, or masses Cardiac: RRR; no murmurs, rubs, or gallops, no edema  Respiratory:  clear to auscultation bilaterally, normal work of breathing GI: soft, nontender, nondistended, + BS MS: no deformity or atrophy Skin: warm and dry, no rash Neuro:  Alert and Oriented x 3, Strength and sensation are intact, follows commands Psych: euthymic mood, full affect  Wt Readings from Last 3 Encounters:  10/31/19 247 lb (112 kg)  05/30/19 249 lb 6.4 oz (113.1 kg)  09/10/18 244 lb 11.4 oz (111 kg)     ASSESSMENT & PLAN:   1. CAD - doing great, clinically stable. No recent angina. Continue ASA, statin. He does not have SL NTG on file due to prior intermittent use of tadalafil, nor has he needed this recently. His baseline HR has been in the 50s-60s so he has not been on beta blocker. 2. Essential HTN - blood pressure is well controlled. Labs in 05/2019 were stable with normal renal function. Continue lisinopril/HCTZ. 3. Hyperlipidemia - LDL well-controlled by last labs in December. Continue atorvastatin. This was just refilled by primary care. 4. Chew tobacco use - counseled on importance of cessation but patient unfortunately states he never plans to quit.   Disposition: F/u with Dr. Meda Coffee in 1 year.  Medication Adjustments/Labs and Tests Ordered: Current medicines are reviewed at length with the patient today.  Concerns regarding medicines are outlined above. Medication changes, Labs and Tests ordered today are summarized above and listed in the Patient Instructions accessible in Encounters.   Signed, Charlie Pitter, PA-C  10/31/2019 3:18 PM    [facility] [place], [place], [place]  [address] Phone: [phone]; Fax: [phone]

## 2019-10-31 ENCOUNTER — Encounter: Payer: Self-pay | Admitting: Physician Assistant

## 2019-10-31 ENCOUNTER — Ambulatory Visit: Payer: BC Managed Care – PPO | Admitting: Physician Assistant

## 2019-10-31 ENCOUNTER — Other Ambulatory Visit: Payer: Self-pay

## 2019-10-31 VITALS — BP 126/74 | HR 63 | Ht 69.5 in | Wt 247.0 lb

## 2019-10-31 DIAGNOSIS — I10 Essential (primary) hypertension: Secondary | ICD-10-CM

## 2019-10-31 DIAGNOSIS — I2510 Atherosclerotic heart disease of native coronary artery without angina pectoris: Secondary | ICD-10-CM | POA: Diagnosis not present

## 2019-10-31 DIAGNOSIS — Z720 Tobacco use: Secondary | ICD-10-CM

## 2019-10-31 DIAGNOSIS — E785 Hyperlipidemia, unspecified: Secondary | ICD-10-CM | POA: Diagnosis not present

## 2019-10-31 NOTE — Patient Instructions (Signed)
Medication Instructions:  Your physician recommends that you continue on your current medications as directed. Please refer to the Current Medication list given to you today.  *If you need a refill on your cardiac medications before your next appointment, please call your pharmacy*   Lab Work: None ordered  If you have labs (blood work) drawn today and your tests are completely normal, you will receive your results only by: . MyChart Message (if you have MyChart) OR . A paper copy in the mail If you have any lab test that is abnormal or we need to change your treatment, we will call you to review the results.   Testing/Procedures: None ordered   Follow-Up: At [facility], you and your health needs are our priority.  As part of our continuing mission to provide you with exceptional heart care, we have created designated Provider Care Teams.  These Care Teams include your primary Cardiologist (physician) and Advanced Practice Providers (APPs -  Physician Assistants and Nurse Practitioners) who all work together to provide you with the care you need, when you need it.  We recommend signing up for the patient portal called "MyChart".  Sign up information is provided on this After Visit Summary.  MyChart is used to connect with patients for Virtual Visits (Telemedicine).  Patients are able to view lab/test results, encounter notes, upcoming appointments, etc.  Non-urgent messages can be sent to your provider as well.   To learn more about what you can do with MyChart, go to [email or web address].    Your next appointment:   12 month(s)  The format for your next appointment:   In Person  Provider:   You may see Katarina Nelson, MD or one of the following Advanced Practice Providers on your designated Care Team:    Dayna Dunn, PA-C  Michele Lenze, PA-C    Other Instructions  

## 2019-11-28 ENCOUNTER — Other Ambulatory Visit: Payer: Self-pay

## 2019-11-28 ENCOUNTER — Encounter: Payer: Self-pay | Admitting: Family Medicine

## 2019-11-28 ENCOUNTER — Ambulatory Visit: Payer: BC Managed Care – PPO | Admitting: Family Medicine

## 2019-11-28 VITALS — BP 120/74 | HR 50 | Temp 98.2°F | Ht 69.0 in | Wt 246.0 lb

## 2019-11-28 DIAGNOSIS — E782 Mixed hyperlipidemia: Secondary | ICD-10-CM

## 2019-11-28 DIAGNOSIS — Z720 Tobacco use: Secondary | ICD-10-CM

## 2019-11-28 DIAGNOSIS — I10 Essential (primary) hypertension: Secondary | ICD-10-CM

## 2019-11-28 LAB — LIPID PANEL
Chol/HDL Ratio: 3.7 ratio (ref 0.0–5.0)
Cholesterol, Total: 104 mg/dL (ref 100–199)
HDL: 28 mg/dL — ABNORMAL LOW (ref >39)
LDL Chol Calc (NIH): 44 mg/dL (ref 0–99)
Triglycerides: 197 mg/dL — ABNORMAL HIGH (ref 0–149)
VLDL Cholesterol Cal: 32 mg/dL (ref 5–40)

## 2019-11-28 LAB — COMPREHENSIVE METABOLIC PANEL
ALT: 23 IU/L (ref 0–44)
AST: 21 IU/L (ref 0–40)
Albumin/Globulin Ratio: 1.7 (ref 1.2–2.2)
Albumin: 4.5 g/dL (ref 3.8–4.8)
Alkaline Phosphatase: 84 IU/L (ref 48–121)
BUN/Creatinine Ratio: 16 (ref 10–24)
BUN: 19 mg/dL (ref 8–27)
Bilirubin Total: 0.5 mg/dL (ref 0.0–1.2)
CO2: 23 mmol/L (ref 20–29)
Calcium: 9.3 mg/dL (ref 8.6–10.2)
Chloride: 100 mmol/L (ref 96–106)
Creatinine, Ser: 1.17 mg/dL (ref 0.76–1.27)
GFR calc Af Amer: 73 mL/min/{1.73_m2} (ref >59)
GFR calc non Af Amer: 63 mL/min/{1.73_m2} (ref >59)
Globulin, Total: 2.6 g/dL (ref 1.5–4.5)
Glucose: 117 mg/dL — ABNORMAL HIGH (ref 65–99)
Potassium: 3.7 mmol/L (ref 3.5–5.2)
Sodium: 137 mmol/L (ref 134–144)
Total Protein: 7.1 g/dL (ref 6.0–8.5)

## 2019-11-28 NOTE — Patient Instructions (Signed)
° ° ° °  If you have lab work done today you will be contacted with your lab results within the next 2 weeks.  If you have not heard from us then please contact us. The fastest way to get your results is to register for My Chart. ° ° °IF you received an x-ray today, you will receive an invoice from [facility]. Please contact [facility] at [phone] with questions or concerns regarding your invoice.  ° °IF you received labwork today, you will receive an invoice from [facility]. Please contact [facility] at [phone] with questions or concerns regarding your invoice.  ° °Our billing staff will not be able to assist you with questions regarding bills from these companies. ° °You will be contacted with the lab results as soon as they are available. The fastest way to get your results is to activate your My Chart account. Instructions are located on the last page of this paperwork. If you have not heard from us regarding the results in 2 weeks, please contact this office. °  ° ° ° °

## 2019-11-28 NOTE — Progress Notes (Signed)
Subjective:  Patient ID: Johnny Wilcox, male    DOB: 01/06/1951  Age: 69 y.o. MRN: [id number]  CC:  Chief Complaint  Patient presents with  . Hypertension    pt reports no issues with BP since last OV. pt checks his BP 3-4x a week and states it's within normal range.pt takes medication as directed with no known side effects. pt reports no physical symptoms of this condition.  . Hyperlipidemia    pt takes medication as directed with no known side effects. pt reports no physical symptoms of this condition    HPI Johnny Wilcox presents for   Hypertension: With history of CAD, status post DES to RCA in 2015.  Cardiology eval May 19.  Clinically stable.  Aspirin, statin.no beta blocker with bradycardia.  No new bleeding.  Takes lisinopril HCT 20/25mg  qd. No new side effects.  Home readings: 120/70's.  BP Readings from Last 3 Encounters:  11/28/19 120/74  10/31/19 126/74  05/30/19 (!) 150/90   Lab Results  Component Value Date   CREATININE 0.93 05/30/2019    Hyperlipidemia: lipitor 80mg  qhs. No new myalgia/side effects.  Lab Results  Component Value Date   CHOL 106 05/30/2019   HDL 37 (L) 05/30/2019   LDLCALC 40 05/30/2019   TRIG 174 (H) 05/30/2019   CHOLHDL 2.9 05/30/2019   Lab Results  Component Value Date   ALT 24 05/30/2019   AST 20 05/30/2019   ALKPHOS 74 05/30/2019   BILITOT 0.4 05/30/2019    Chewing tobacco use: Denies plans of cessation at this time.    History Patient Active Problem List   Diagnosis Date Noted  . Chondromalacia, right knee 02/18/2017  . Hearing loss at 3000 bilat 03/2014 03/20/2014  . CTS (carpal tunnel syndrome)-bilat 2015 03/20/2014  . Full dentures 03/20/2014  . CAD (coronary artery disease) 10/04/2013  . Angina pectoris ([place]) 09/21/2013  . ED (erectile dysfunction) 04/04/2012  . HTN (hypertension) 03/22/2012  . BMI 34.0-34.9,adult 03/22/2012  . Diverticulosis Colonoscopy 2003 03/22/2012  . BPH (benign prostatic  hyperplasia)-mild July 2003 03/22/2012  . Hyperlipidemia-Mild 03/22/2012   Past Medical History:  Diagnosis Date  . CAD in native artery    a. abnormal nuc 2015 - s/p DES to RCA, minimal disease in prox LAD, EF 55-65%.  . Cataract    removed from left eye  . ED (erectile dysfunction)   . Hyperlipidemia   . Hypertension    Past Surgical History:  Procedure Laterality Date  . CATARACT EXTRACTION Right   . COLONOSCOPY    . CORONARY STENT PLACEMENT    . EYE SURGERY     child injury-repair.    [name] KNEE ARTHROSCOPY Right 02/18/2017   Procedure: ARTHROSCOPY RIGHT KNEE WITH PARTIAL MEDIAL MENISECTOMY, MEDIAL CHONDROPLASTY AND PATELLA, AND MEDIAL PLICO EXCISION;  Surgeon: [name], MD;  Location: [facility];  Service: Orthopedics;  Laterality: Right;  . LEFT HEART CATHETERIZATION WITH CORONARY ANGIOGRAM N/A 09/21/2013   Procedure: LEFT HEART CATHETERIZATION WITH CORONARY ANGIOGRAM;  Surgeon: [name], MD;  Location: [facility] CATH LAB;  Service: Cardiovascular;  Laterality: N/A;  . MULTIPLE EXTRACTIONS WITH ALVEOLOPLASTY  12/06/2011   Procedure: MULTIPLE EXTRACION WITH ALVEOLOPLASTY;  Surgeon: [name], DDS;  Location: [place];  Service: Oral Surgery;  Laterality: Bilateral;  Biopsy tissue right vestibule  . PERCUTANEOUS CORONARY STENT INTERVENTION (PCI-S)  09/21/2013   Procedure: PERCUTANEOUS CORONARY STENT INTERVENTION (PCI-S);  Surgeon: [name], MD;  Location[facility] CATH LAB;  Service: Cardiovascular;;  .  VASECTOMY     No Known Allergies Prior to Admission medications   Medication Sig Start Date End Date Taking? Authorizing Provider  aspirin EC 81 MG tablet Take 81 mg by mouth daily.   Yes [provider]  atorvastatin (LIPITOR) 80 MG tablet Take 1 tablet (80 mg total) by mouth daily. 10/19/19  Yes Wendie Agreste, MD  lisinopril-hydrochlorothiazide (ZESTORETIC) 20-25 MG tablet Take 1 tablet by mouth daily. 10/19/19  Yes Wendie Agreste, MD   Social History   Socioeconomic  History  . Marital status: Married    Spouse name: Not on file  . Number of children: Not on file  . Years of education: Not on file  . Highest education level: Not on file  Occupational History  . Not on file  Tobacco Use  . Smoking status: Never Smoker  . Smokeless tobacco: Current User    Types: Chew  Vaping Use  . Vaping Use: Never used  Substance and Sexual Activity  . Alcohol use: Yes    Comment:  maybe 3 beers a month  . Drug use: No  . Sexual activity: Yes  Other Topics Concern  . Not on file  Social History Narrative  . Not on file   Social Determinants of Health   Financial Resource Strain:   . Difficulty of Paying Living Expenses:   Food Insecurity:   . Worried About Charity fundraiser in the Last Year:   . Arboriculturist in the Last Year:   Transportation Needs:   . Film/video editor (Medical):   [name] Lack of Transportation (Non-Medical):   Physical Activity:   . Days of Exercise per Week:   . Minutes of Exercise per Session:   Stress:   . Feeling of Stress :   Social Connections:   . Frequency of Communication with Friends and Family:   . Frequency of Social Gatherings with Friends and Family:   . Attends Religious Services:   . Active Member of Clubs or Organizations:   . Attends Archivist Meetings:   [name] Marital Status:   Intimate Partner Violence:   . Fear of Current or Ex-Partner:   . Emotionally Abused:   [name] Physically Abused:   . Sexually Abused:     Review of Systems  Constitutional: Negative for fatigue and unexpected weight change.  Eyes: Negative for visual disturbance.  Respiratory: Negative for cough, chest tightness and shortness of breath.   Cardiovascular: Negative for chest pain, palpitations and leg swelling.  Gastrointestinal: Negative for abdominal pain and blood in stool.  Neurological: Negative for dizziness, light-headedness and headaches.     Objective:   Vitals:   11/28/19 0806  BP: 120/74  Pulse: (!)  50  Temp: 98.2 F (36.8 C)  TempSrc: Temporal  SpO2: 98%  Weight: 246 lb (111.6 kg)  Height: 5\' 9"  (1.753 m)     Physical Exam Vitals reviewed.  Constitutional:      Appearance: He is well-developed.  HENT:     Head: Normocephalic and atraumatic.  Eyes:     Pupils: Pupils are equal, round, and reactive to light.  Neck:     Vascular: No carotid bruit or JVD.  Cardiovascular:     Rate and Rhythm: Normal rate and regular rhythm.     Heart sounds: Normal heart sounds. No murmur heard.   Pulmonary:     Effort: Pulmonary effort is normal.     Breath sounds: Normal breath sounds. No rales.  Skin:    General: Skin is warm and dry.  Neurological:     Mental Status: He is alert and oriented to person, place, and time.        Assessment & Plan:  Johnny Wilcox is a 69 y.o. male . Essential hypertension - Plan: Lipid panel, Comprehensive metabolic panel  -Tolerating current regimen, continue same, has refills.  Labs pending  Mixed hyperlipidemia - Plan: Lipid panel, Comprehensive metabolic panel  -Tolerating statin current dose, repeat labs.  Continue same.  History of CAD but asymptomatic.  Ongoing cardiology follow-up.  Wellness exam in 6 months.  Current nicotine use Chewing tobacco, does not plan cessation at this time.  Potential health risks briefly discussed.  No orders of the defined types were placed in this encounter.  Patient Instructions       If you have lab work done today you will be contacted with your lab results within the next 2 weeks.  If you have not heard from [place] then please contact us. The fastest way to get your results is to register for My Chart.   IF you received an x-ray today, you will receive an invoice from [facility]. Please contact [facility] at [phone] with questions or concerns regarding your invoice.   IF you received labwork today, you will receive an invoice from [facility]. Please contact [facility] at  [phone] with questions or concerns regarding your invoice.   Our billing staff will not be able to assist you with questions regarding bills from these companies.  You will be contacted with the lab results as soon as they are available. The fastest way to get your results is to activate your My Chart account. Instructions are located on the last page of this paperwork. If you have not heard from [place] regarding the results in 2 weeks, please contact this office.         Signed, Merri Ray, MD [facility]

## 2020-01-11 ENCOUNTER — Other Ambulatory Visit: Payer: Self-pay

## 2020-01-11 ENCOUNTER — Ambulatory Visit
Admission: RE | Admit: 2020-01-11 | Discharge: 2020-01-11 | Disposition: A | Discharge status: Home / Self Care | Payer: BC Managed Care – PPO | Source: Ambulatory Visit | Attending: Nurse Practitioner | Admitting: Nurse Practitioner

## 2020-01-11 VITALS — BP 127/80 | HR 63 | Temp 98.0°F | Resp 20

## 2020-01-11 DIAGNOSIS — J209 Acute bronchitis, unspecified: Secondary | ICD-10-CM | POA: Diagnosis not present

## 2020-01-11 DIAGNOSIS — Z20822 Contact with and (suspected) exposure to covid-19: Secondary | ICD-10-CM | POA: Diagnosis not present

## 2020-01-11 MED ORDER — PREDNISONE 20 MG PO TABS
40.0000 mg | ORAL_TABLET | Freq: Every day | ORAL | 0 refills | Status: AC
Start: 2020-01-11 — End: 2020-01-16

## 2020-01-11 MED ORDER — AZITHROMYCIN 250 MG PO TABS
250.0000 mg | ORAL_TABLET | Freq: Every day | ORAL | 0 refills | Status: DC
Start: 2020-01-11 — End: 2020-03-21

## 2020-01-11 MED ORDER — DM-GUAIFENESIN ER 30-600 MG PO TB12
1.0000 | ORAL_TABLET | Freq: Two times a day (BID) | ORAL | 0 refills | Status: AC
Start: 2020-01-11 — End: 2020-01-18

## 2020-01-11 MED ORDER — PROMETHAZINE-DM 6.25-15 MG/5ML PO SYRP
5.0000 mL | ORAL_SOLUTION | Freq: Four times a day (QID) | ORAL | 0 refills | Status: DC | PRN
Start: 2020-01-11 — End: 2020-03-21

## 2020-01-11 NOTE — ED Triage Notes (Signed)
Pt presents with productive cough x 2 weeks. States is worsens at night and when is in a place with air conditioner. Denies other symptoms. Pt has no tried any medication for the complaint.

## 2020-01-11 NOTE — Discharge Instructions (Signed)
Take medications as prescribed. You may take tylenol or ibuprofen as needed for fevers/headache/body aches. Drink plenty of fluids. Stay in home isolation until you receive results of your COVID test. You will only be notified for positive results. You may go online to MyChart in the next few days and review your results. Please follow CDC guidelines that are attached. You may discontinue home isolation when there has been at least 10 days since symptoms onset AND 3 days fever free without antipyretics (Tylenol or Ibuprofen) AND an overall improvement in your symptoms. Go to the ED immediately if you get worse or have any other symptoms.   Feel better soon!  Aldona Bar, FNP-C

## 2020-01-11 NOTE — ED Provider Notes (Signed)
[facility]    CSN: [id number] Arrival date & time: 01/11/20  1001      History   Chief Complaint Chief Complaint  Patient presents with   Appointment   Cough    HPI GADGE HERMIZ is a 69 y.o. male.   Subjective:  UNDRAY ALLMAN is a 69 y.o. male who presents for evaluation of symptoms of a URI. Symptoms include chest congestion, headache, nasal blockage, productive cough and sore throat.  He denies any fevers, body aches, chest pain, shortness of breath, wheezing, dizziness or change in taste/smell.  Onset of symptoms was 2 weeks ago, gradually worsening since that time. He hasn't tried anything for his symptoms. He is drinking plenty of fluids. Evaluation to date: none. Treatment to date: none. He denies any known COVID-19 contacts. He has not had COVID-19 in the past. He has been vaccinated against COVID-19 since April 2021.   The following portions of the patient's history were reviewed and updated as appropriate: allergies, current medications, past family history, past medical history, past social history, past surgical history and problem list.       Past Medical History:  Diagnosis Date   CAD in native artery    a. abnormal nuc 2015 - s/p DES to RCA, minimal disease in prox LAD, EF 55-65%.   Cataract    removed from left eye   ED (erectile dysfunction)    Hyperlipidemia    Hypertension     Patient Active Problem List   Diagnosis Date Noted   Chondromalacia, right knee 02/18/2017   Hearing loss at 3000 bilat 03/2014 03/20/2014   CTS (carpal tunnel syndrome)-bilat 2015 03/20/2014   Full dentures 03/20/2014   CAD (coronary artery disease) 10/04/2013   Angina pectoris ([place]) 09/21/2013   ED (erectile dysfunction) 04/04/2012   HTN (hypertension) 03/22/2012   BMI 34.0-34.9,adult 03/22/2012   Diverticulosis Colonoscopy 2003 03/22/2012   BPH (benign prostatic hyperplasia)-mild July 2003 03/22/2012   Hyperlipidemia-Mild 03/22/2012     Past Surgical History:  Procedure Laterality Date   CATARACT EXTRACTION Right    COLONOSCOPY     CORONARY STENT PLACEMENT     EYE SURGERY     child injury-repair.     KNEE ARTHROSCOPY Right 02/18/2017   Procedure: ARTHROSCOPY RIGHT KNEE WITH PARTIAL MEDIAL MENISECTOMY, MEDIAL CHONDROPLASTY AND PATELLA, AND MEDIAL PLICO EXCISION;  Surgeon: [name], MD;  Location: [facility];  Service: Orthopedics;  Laterality: Right;   LEFT HEART CATHETERIZATION WITH CORONARY ANGIOGRAM N/A 09/21/2013   Procedure: LEFT HEART CATHETERIZATION WITH CORONARY ANGIOGRAM;  Surgeon: [name], MD;  Location: [facility] CATH LAB;  Service: Cardiovascular;  Laterality: N/A;   MULTIPLE EXTRACTIONS WITH ALVEOLOPLASTY  12/06/2011   Procedure: MULTIPLE EXTRACION WITH ALVEOLOPLASTY;  Surgeon: [name], DDS;  Location: [place];  Service: Oral Surgery;  Laterality: Bilateral;  Biopsy tissue right vestibule   PERCUTANEOUS CORONARY STENT INTERVENTION (PCI-S)  09/21/2013   Procedure: PERCUTANEOUS CORONARY STENT INTERVENTION (PCI-S);  Surgeon: [name], MD;  Location: [facility] CATH LAB;  Service: Cardiovascular;;   VASECTOMY         Home Medications    Prior to Admission medications   Medication Sig Start Date End Date Taking? Authorizing Provider  aspirin EC 81 MG tablet Take 81 mg by mouth daily.    [provider]  atorvastatin (LIPITOR) 80 MG tablet Take 1 tablet (80 mg total) by mouth daily. 10/19/19   Wendie Agreste, MD  azithromycin (ZITHROMAX Z-PAK) 250 MG  tablet Take 1 tablet (250 mg total) by mouth daily. Take 2 tablets on day one followed by one tablet on days 2-5 01/11/20   Enrique Sack, FNP  dextromethorphan-guaiFENesin [facility] DM) 30-600 MG 12hr tablet Take 1 tablet by mouth 2 (two) times daily for 7 days. 01/11/20 01/18/20  Enrique Sack, FNP  lisinopril-hydrochlorothiazide (ZESTORETIC) 20-25 MG tablet Take 1 tablet by mouth daily. 10/19/19   Wendie Agreste, MD  predniSONE  (DELTASONE) 20 MG tablet Take 2 tablets (40 mg total) by mouth daily for 5 days. 01/11/20 01/16/20  Enrique Sack, FNP  promethazine-dextromethorphan (PROMETHAZINE-DM) 6.25-15 MG/5ML syrup Take 5 mLs by mouth 4 (four) times daily as needed for cough. 01/11/20   Enrique Sack, FNP    Family History Family History  Problem Relation Age of Onset   Heart disease Father    Heart disease Mother    Stroke Mother    Hypertension Sister    Colon cancer Neg Hx    Esophageal cancer Neg Hx    Rectal cancer Neg Hx    Stomach cancer Neg Hx     Social History Social History   Tobacco Use   Smoking status: Never Smoker   Smokeless tobacco: Current User    Types: Chew  Vaping Use   Vaping Use: Never used  Substance Use Topics   Alcohol use: Yes    Comment:  maybe 3 beers a month   Drug use: No     Allergies   Patient has no known allergies.   Review of Systems Review of Systems   Physical Exam Triage Vital Signs ED Triage Vitals [01/11/20 1015]  Enc Vitals Group     BP 127/80     Pulse Rate 63     Resp 20     Temp 98 F (36.7 C)     Temp Source Oral     SpO2 95 %     Weight      Height      Head Circumference      Peak Flow      Pain Score 0     Pain Loc      Pain Edu?      Excl. in [place]?    No data found.  Updated Vital Signs BP 127/80 (BP Location: Left Arm)    Pulse 63    Temp 98 F (36.7 C) (Oral)    Resp 20    SpO2 95%   Visual Acuity Right Eye Distance:   Left Eye Distance:   Bilateral Distance:    Right Eye Near:   Left Eye Near:    Bilateral Near:     Physical Exam   UC Treatments / Results  Labs (all labs ordered are listed, but only abnormal results are displayed) Labs Reviewed  NOVEL CORONAVIRUS, NAA    EKG   Radiology No results found.  Procedures Procedures (including critical care time)  Medications Ordered in UC Medications - No data to display  Initial Impression / Assessment and Plan / UC Course  I have  reviewed the triage vital signs and the nursing notes.  Pertinent labs & imaging results that were available during my care of the patient were reviewed by me and considered in my medical decision making (see chart for details).    69 yo male with a history of hypertension and hyperlipidemia presented with a 2-week history of a URI type symptoms.  No fevers.  Patient is afebrile.  Nontoxic-appearing.  Physical exam unremarkable.  COVID-19 testing pending.  Treat with steroids, antibiotics and antitussives.  Return precautions discussed.  Today's evaluation has revealed no signs of a dangerous process. Discussed diagnosis with patient and/or guardian. Patient and/or guardian aware of their diagnosis, possible red flag symptoms to watch out for and need for close follow up. Patient and/or guardian understands verbal and written discharge instructions. Patient and/or guardian comfortable with plan and disposition.  Patient and/or guardian has a clear mental status at this time, good insight into illness (after discussion and teaching) and has clear judgment to make decisions regarding their care  This care was provided during an unprecedented National Emergency due to the Novel Coronavirus (COVID-19) pandemic. COVID-19 infections and transmission risks place heavy strains on healthcare resources.  As this pandemic evolves, our facility, providers, and staff strive to respond fluidly, to remain operational, and to provide care relative to available resources and information. Outcomes are unpredictable and treatments are without well-defined guidelines. Further, the impact of COVID-19 on all aspects of urgent care, including the impact to patients seeking care for reasons other than COVID-19, is unavoidable during this national emergency. At this time of the global pandemic, management of patients has significantly changed, even for non-COVID positive patients given high local and regional COVID volumes at this  time requiring high healthcare system and resource utilization. The standard of care for management of both COVID suspected and non-COVID suspected patients continues to change rapidly at the local, regional, national, and global levels. This patient was worked up and treated to the best available but ever changing evidence and resources available at this current time.   Documentation was completed with the aid of voice recognition software. Transcription may contain typographical errors. Final Clinical Impressions(s) / UC Diagnoses   Final diagnoses:  Acute bronchitis, unspecified organism  Encounter for screening laboratory testing for COVID-19 virus     Discharge Instructions     Take medications as prescribed. You may take tylenol or ibuprofen as needed for fevers/headache/body aches. Drink plenty of fluids. Stay in home isolation until you receive results of your COVID test. You will only be notified for positive results. You may go online to MyChart in the next few days and review your results. Please follow CDC guidelines that are attached. You may discontinue home isolation when there has been at least 10 days since symptoms onset AND 3 days fever free without antipyretics (Tylenol or Ibuprofen) AND an overall improvement in your symptoms. Go to the ED immediately if you get worse or have any other symptoms.   Feel better soon!  Aldona Bar, FNP-C     ED Prescriptions    Medication Sig Dispense Auth. Provider   azithromycin (ZITHROMAX Z-PAK) 250 MG tablet Take 1 tablet (250 mg total) by mouth daily. Take 2 tablets on day one followed by one tablet on days 2-5 6 tablet Enrique Sack, FNP   predniSONE (DELTASONE) 20 MG tablet Take 2 tablets (40 mg total) by mouth daily for 5 days. 10 tablet Enrique Sack, FNP   dextromethorphan-guaiFENesin [facility] DM) 30-600 MG 12hr tablet Take 1 tablet by mouth 2 (two) times daily for 7 days. 14 tablet Enrique Sack, FNP    promethazine-dextromethorphan (PROMETHAZINE-DM) 6.25-15 MG/5ML syrup Take 5 mLs by mouth 4 (four) times daily as needed for cough. 118 mL Enrique Sack, FNP     PDMP not reviewed this encounter.   Enrique Sack, [place] 01/11/20 1049

## 2020-01-12 LAB — SARS-COV-2, NAA 2 DAY TAT

## 2020-01-12 LAB — NOVEL CORONAVIRUS, NAA: SARS-CoV-2, NAA: NOT DETECTED

## 2020-01-23 ENCOUNTER — Telehealth: Payer: Self-pay | Admitting: Cardiology

## 2020-01-23 NOTE — Telephone Encounter (Signed)
Pt is calling in stating he is due for DOT clearance, and the company [employer], advised for him to obtain this clearance from our office.  Pt states [employer] instructed him to call the office and ask for [place] to do a stress test on him. Pt states [employer] did not provide him with any DOT clearance forms, they only told him he was due to have a stress test done.  Last stress test done on the pt was back in 2015. Advised the pt to reach out to [facility] tomorrow, and ask them to fax over his DOT Clearance form for [place] to sign off on, as well as ask them to order and advise on what type stress test he will need to have done by [place], to get him cleared for DOT.  Informed the pt that they will also need to include where we need to send his clearance forms back to, once complete.  Pt states he will call [employer] tomorrow and have them advise on ordering a stress test, and send over any forms that needed to be signed by Dr. Meda Coffee, so that he can have DOT clearance.  Advised the pt that when he calls back tomorrow, he should request to speak with a triage nurse for assistance, for I will be out of the office.  Pt recently saw Dayna Dunn PA-C back in 10/2019 and was advised to follow-up with Dr. Meda Coffee in one year.  He more than likely will not be required to see a Provider again for clearance.   Pt verbalized understanding and agrees with this plan. Will forward this message to triage pool for follow-up, when pt calls back tomorrow.

## 2020-01-23 NOTE — Telephone Encounter (Signed)
New Message   Patient is calling in to schedule a stress test. Please assist with putting an order in and scheduling the stress test.

## 2020-01-24 ENCOUNTER — Telehealth: Payer: Self-pay | Admitting: Cardiology

## 2020-01-24 DIAGNOSIS — E782 Mixed hyperlipidemia: Secondary | ICD-10-CM

## 2020-01-24 DIAGNOSIS — I2510 Atherosclerotic heart disease of native coronary artery without angina pectoris: Secondary | ICD-10-CM

## 2020-01-24 DIAGNOSIS — I10 Essential (primary) hypertension: Secondary | ICD-10-CM

## 2020-01-24 NOTE — Telephone Encounter (Signed)
Pt is returning Dr. Francesca Oman nurse call from yesterday. States that he was told to contact triage. Says that he desperately needs a stress test scheduled. Please  call

## 2020-01-24 NOTE — Telephone Encounter (Signed)
Pt calling to let Johnny Wilcox know he will need an ETT per his DOT MD. He has a 90 day health permit to allow time to have the test performed.   I will forward to Dr. Meda Coffee and his RN to review and approve an order for the ETT.  Pt understands someone will call him from the office to schedule.

## 2020-01-25 ENCOUNTER — Encounter: Payer: Self-pay | Admitting: *Deleted

## 2020-01-25 NOTE — Telephone Encounter (Signed)
You are scheduled for an Exercise Stress Test on --- at ---  Please arrive 15 minutes prior to your appointment time for registration and insurance purposes.  The test will take approximately 45 minutes to complete.  How to prepare for your Exercise Stress Test:  Do wear comfortable clothes (no dresses or overalls) and walking shoes, tennis shoes preferred (no heels or open toed shoes are allowed)             Do not eat, drink, or use tobacco products 4 hours prior to this test.  Do Not wear cologne, perfume, aftershave or lotions (deodorant is allowed).  Please report to [place] [facility] for your test.  If these instructions are not followed, your test will have to be rescheduled.  If you have questions or concerns about your appointment, you can call the Stress Lab at [phone].  If you cannot keep your appointment, please provide 24 hours notification to the Stress Lab, to avoid a possible $50 charge to your account.  [place], [phone]                      1     Spoke with the pt and informed him that I have placed the order for him to have his GXT done.  Informed him that I will send a message to our [facility] Schedulers to call him back and arrange this appt date and time.  Went over the GXT instructions, as mentioned above, with the pt over the phone.  He wrote these down as well.  Informed him that I will also mail him the instructions to have on hand as well. Pt verbalized understanding and agrees with this plan.

## 2020-01-25 NOTE — Telephone Encounter (Signed)
Me     12:17 PM Note Pt is scheduled for his covid test for 01/26/20 and his GXT for 01/30/20 at 0945.  Pt made aware of appt date and time by [facility] scheduling.     Me     8:45 AM Note You are scheduled for an Exercise Stress Test on---at---  Please arrive 15 minutes prior to your appointment timefor registration and insurance purposes.  The test will take approximately 45 minutes to complete.  How to prepare for your Exercise Stress Test:  Dowear comfortable clothes (no dresses or overalls) and walking shoes, tennis shoes preferred (no heels or open toed shoes are allowed) Do not eat, drink, or use tobacco products 4 hours prior to this test.  Do Not wear cologne, perfume, aftershave or lotions (deodorant is allowed).  Please report to [place] [address]for your test.  If these instructions are not followed, your test will have to be rescheduled.  If you have questions or concerns about your appointment, you can call the Stress Lab at [phone].  If you cannot keep your appointment, please provide 24 hours notification to the Stress Lab, to avoid a possible $50 charge to your account. [place], [phone]     Spoke with the pt and informed him that I have placed the order for him to have his GXT done.  Informed him that I will send a message to our [facility] Schedulers to call him back and arrange this appt date and time.  Went over the GXT instructions, as mentioned above, with the pt over the phone.  He wrote these down as well.  Informed him that I will also mail him the instructions to have on hand as well. Pt verbalized understanding and agrees with this plan

## 2020-01-25 NOTE — Telephone Encounter (Signed)
Pt is scheduled for his covid test for 01/26/20 and his GXT for 01/30/20 at 0945.  Pt made aware of appt date and time by [facility] scheduling.

## 2020-01-26 ENCOUNTER — Other Ambulatory Visit (HOSPITAL_COMMUNITY)
Admission: RE | Admit: 2020-01-26 | Discharge: 2020-01-26 | Disposition: A | Discharge status: Home / Self Care | Payer: BC Managed Care – PPO | Source: Ambulatory Visit | Attending: Cardiology | Admitting: Cardiology

## 2020-01-26 DIAGNOSIS — Z01812 Encounter for preprocedural laboratory examination: Secondary | ICD-10-CM | POA: Diagnosis not present

## 2020-01-26 DIAGNOSIS — Z20822 Contact with and (suspected) exposure to covid-19: Secondary | ICD-10-CM | POA: Diagnosis not present

## 2020-01-26 LAB — SARS CORONAVIRUS 2 (TAT 6-24 HRS): SARS Coronavirus 2: NEGATIVE

## 2020-01-29 ENCOUNTER — Telehealth (HOSPITAL_COMMUNITY): Payer: Self-pay

## 2020-01-29 NOTE — Telephone Encounter (Signed)
Encounter complete. 

## 2020-01-30 ENCOUNTER — Other Ambulatory Visit: Payer: Self-pay

## 2020-01-30 ENCOUNTER — Ambulatory Visit (HOSPITAL_COMMUNITY)
Admission: RE | Admit: 2020-01-30 | Discharge: 2020-01-30 | Disposition: A | Discharge status: Home / Self Care | Payer: BC Managed Care – PPO | Source: Ambulatory Visit | Attending: Cardiology | Admitting: Cardiology

## 2020-01-30 DIAGNOSIS — E782 Mixed hyperlipidemia: Secondary | ICD-10-CM | POA: Insufficient documentation

## 2020-01-30 DIAGNOSIS — I10 Essential (primary) hypertension: Secondary | ICD-10-CM | POA: Insufficient documentation

## 2020-01-30 DIAGNOSIS — I2510 Atherosclerotic heart disease of native coronary artery without angina pectoris: Secondary | ICD-10-CM | POA: Insufficient documentation

## 2020-01-30 LAB — EXERCISE TOLERANCE TEST
Estimated workload: 10.1 METS
Exercise duration (min): 8 min
Exercise duration (sec): 9 s
MPHR: 150 {beats}/min
Peak HR: 151 {beats}/min
Percent HR: 100 %
Rest HR: 51 {beats}/min

## 2020-01-31 ENCOUNTER — Other Ambulatory Visit: Payer: Self-pay | Admitting: Emergency Medicine

## 2020-01-31 DIAGNOSIS — M7918 Myalgia, other site: Secondary | ICD-10-CM

## 2020-01-31 NOTE — Telephone Encounter (Signed)
Requested medication (s) are due for refill today: no  Requested medication (s) are on the active medication list: no  Last refill: Not on current med list  Future visit scheduled: yes  Notes to clinic:  Please review for refill. Requested medication not on current med list    Requested Prescriptions  Pending Prescriptions Disp Refills   meloxicam (MOBIC) 15 MG tablet [Pharmacy Med Name: MELOXICAM 15 MG TABLET] 12 tablet 1    Sig: Take 1 tablet (15 mg total) by mouth daily for 7 days.      Analgesics:  COX2 Inhibitors Failed - 01/31/2020  3:54 PM      Failed - HGB in normal range and within 360 days    Hemoglobin  Date Value Ref Range Status  04/26/2018 13.5 13.0 - 17.7 g/dL Final          Passed - Cr in normal range and within 360 days    Creat  Date Value Ref Range Status  08/05/2013 0.94 0.50 - 1.35 mg/dL Final   Creatinine, Ser  Date Value Ref Range Status  11/28/2019 1.17 0.76 - 1.27 mg/dL Final          Passed - Patient is not pregnant      Passed - Valid encounter within last 12 months    Recent Outpatient Visits           2 months ago Essential hypertension   [facility], [place]   8 months ago Medicare annual wellness visit, subsequent   [facility], Ranell Patrick, MD   1 year ago Acute pain of left shoulder   [facility] at [facility], Ines Bloomer, MD   1 year ago Annual physical exam   [facility], Laurey Arrow, MD   2 years ago Left buttock pain   [facility], Laurey Arrow, MD       Future Appointments             In 3 months Carlota Raspberry Ranell Patrick, MD [facility] [place], [facility]

## 2020-01-31 NOTE — Telephone Encounter (Signed)
Please schedule appt for pain. Patient was seen but not for pain. Need to have appt for pain meds

## 2020-02-01 NOTE — Telephone Encounter (Signed)
02/01/2020 - A REFILL REQUEST CAME HERE FOR PATIENT TO REFILL HIS MELOXICAM 15 mg. I CALLED PATIENT TO SCHEDULE AN OFFICE VISIT WITH DR. Carlota Raspberry. MR. Johnny Wilcox SAID HE DOES NOT TAKE ANY PAIN MEDICATIONS. HE DOES NOT NEED THIS. I TOLD [facility] TO [facility] [place] TO LET THEM KNOW. I DO NOT NEED TO ROUTE BACK TO THE CLINICAL STATION AT THIS TIME. [place]

## 2020-03-21 ENCOUNTER — Other Ambulatory Visit: Payer: Self-pay

## 2020-03-21 ENCOUNTER — Encounter: Payer: Self-pay | Admitting: Family Medicine

## 2020-03-21 ENCOUNTER — Ambulatory Visit: Payer: BC Managed Care – PPO | Admitting: Family Medicine

## 2020-03-21 VITALS — BP 127/60 | HR 87 | Ht 69.0 in | Wt 246.0 lb

## 2020-03-21 DIAGNOSIS — I10 Essential (primary) hypertension: Secondary | ICD-10-CM

## 2020-03-21 DIAGNOSIS — Z0000 Encounter for general adult medical examination without abnormal findings: Secondary | ICD-10-CM | POA: Insufficient documentation

## 2020-03-21 DIAGNOSIS — Z1212 Encounter for screening for malignant neoplasm of rectum: Secondary | ICD-10-CM | POA: Diagnosis not present

## 2020-03-21 DIAGNOSIS — Z1211 Encounter for screening for malignant neoplasm of colon: Secondary | ICD-10-CM

## 2020-03-21 NOTE — Progress Notes (Signed)
SUBJECTIVE:   CHIEF COMPLAINT / HPI:   Establish care  Current concerns  Right knee pain does not want management at this time. Had surgery in the past.   PMH HTN- Lisinopril-HCTZ HLD- Atorvistatin  CAD- Aspirin  BPH   PSH  Left knee surgery- 2018  Cardiac stent- 2016  Cataract surgery- 2005   Allergies  NKDA   Family Heart attack- father at 53  Mother- stroke at 66 and had another stroke 26   Social  Lives with wife and daughter. 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  He travels a lot for his work. Chews tabacco for 50 years. Drinks EtOH occasionally 2-3 12 oz beers a week. Denies other drugs.  Discussed chewing tobacco cessation but patient reports he is not ready to do that at this time.  PERTINENT  PMH / PSH:   OBJECTIVE:   BP 127/60   Pulse 87   Ht 5\' 9"  (1.753 m)   Wt 246 lb (111.6 kg)   BMI 36.33 kg/m   Physical Exam Vitals and nursing note reviewed.  Constitutional:      Appearance: Normal appearance. He is obese.  HENT:     Head: Normocephalic and atraumatic.     Right Ear: Tympanic membrane, ear canal and external ear normal.     Left Ear: Tympanic membrane, ear canal and external ear normal.     Nose: Nose normal.     Mouth/Throat:     Mouth: Mucous membranes are moist.     Pharynx: No oropharyngeal exudate or posterior oropharyngeal erythema.  Eyes:     Extraocular Movements: Extraocular movements intact.     Pupils: Pupils are equal, round, and reactive to light.  Cardiovascular:     Rate and Rhythm: Normal rate and regular rhythm.     Pulses: Normal pulses.     Heart sounds: Normal heart sounds. No murmur heard.   Pulmonary:     Effort: Pulmonary effort is normal.     Breath sounds: Normal breath sounds.  Abdominal:     General: Bowel sounds are normal. There is no distension.     Palpations: Abdomen is soft.  Musculoskeletal:        General: No swelling. Normal range of motion.     Cervical back: Normal range of motion and  neck supple.  Skin:    General: Skin is warm and dry.     Capillary Refill: Capillary refill takes less than 2 seconds.  Neurological:     General: No focal deficit present.     Mental Status: He is alert and oriented to person, place, and time.     Cranial Nerves: No cranial nerve deficit.  Psychiatric:        Mood and Affect: Mood normal.        Behavior: Behavior normal.        Thought Content: Thought content normal.        Judgment: Judgment normal.      ASSESSMENT/PLAN:   Annual physical exam Patient is here to establish care as well as get his annual physical exam for work.  He has chronic knee pain but reports it is not bad enough to need treatment at this time.  He has no other current concerns.  Patient recently had lab work as well as exercise stress test. -Physical exam completed -Ambulatory referral to gastroenterology placed for colonoscopy -Hypertension is well controlled at this time, no changes needed for his medications -Patient is taking  atorvastatin for his hyperlipidemia -Patient is taking aspirin for his CAD.   Gifford Shave, MD [place]

## 2020-03-21 NOTE — Patient Instructions (Signed)
Pleasure to see you today!  I am glad you are doing well.  I know you need your annual wellness physical and I have completed that today, you can bring the form and I will sign it.  I have also sent a referral in for a colonoscopy.  If you have any questions, concerns, or need anything please feel free to reach out and we can either schedule an appointment or I can do the best help you over the phone.  I hope you have a wonderful afternoon!

## 2020-03-21 NOTE — Assessment & Plan Note (Addendum)
Patient is here to establish care as well as get his annual physical exam for work.  He has chronic knee pain but reports it is not bad enough to need treatment at this time.  He has no other current concerns.  Patient recently had lab work as well as exercise stress test. -Physical exam completed -Ambulatory referral to gastroenterology placed for colonoscopy -Hypertension is well controlled at this time, no changes needed for his medications -Patient is taking atorvastatin for his hyperlipidemia -Patient is taking aspirin for his CAD.

## 2020-04-30 ENCOUNTER — Ambulatory Visit: Payer: BC Managed Care – PPO | Admitting: Family Medicine

## 2020-04-30 ENCOUNTER — Encounter: Payer: Self-pay | Admitting: Gastroenterology

## 2020-05-17 ENCOUNTER — Ambulatory Visit: Payer: BC Managed Care – PPO | Attending: Internal Medicine

## 2020-05-17 DIAGNOSIS — Z23 Encounter for immunization: Secondary | ICD-10-CM

## 2020-05-17 NOTE — Progress Notes (Signed)
   Covid-19 Vaccination Clinic  Name:  Johnny Wilcox    MRN: [id number] DOB: 06/16/1950  05/17/2020  [name] was observed post Covid-19 immunization for 15 minutes without incident. He was provided with Vaccine Information Sheet and instruction to access the V-Safe system.   [name] was instructed to call 911 with any severe reactions post vaccine: [name] Difficulty breathing  . Swelling of face and throat  . A fast heartbeat  . A bad rash all over body  . Dizziness and weakness   Immunizations Administered    Name Date Dose VIS Date Route   Pfizer COVID-19 Vaccine 05/17/2020 10:24 AM 0.3 mL 04/02/2020 Intramuscular   Manufacturer: [place]   Lot: [id number]   NDC: [address]-8

## 2020-05-19 ENCOUNTER — Telehealth: Payer: Self-pay

## 2020-05-19 ENCOUNTER — Other Ambulatory Visit: Payer: Self-pay

## 2020-05-19 ENCOUNTER — Ambulatory Visit: Payer: BC Managed Care – PPO | Admitting: Family Medicine

## 2020-05-19 NOTE — Telephone Encounter (Signed)
Called pt at [facility] request to get more information as to why the pt changed his mind about keeping his appointment despite Dr.Greene's willingness to accommodate the pt's wellness exam. Pt answered the call and stated the following " Every time I come in there their is some kind of confusion, and I'm tiered of it. Tell Dr.Green that it was nothing that he did. Have a good day sir" the pt then ended the call.

## 2020-06-16 ENCOUNTER — Ambulatory Visit (AMBULATORY_SURGERY_CENTER): Payer: Self-pay | Admitting: *Deleted

## 2020-06-16 ENCOUNTER — Other Ambulatory Visit: Payer: Self-pay

## 2020-06-16 VITALS — Ht 69.0 in | Wt 251.0 lb

## 2020-06-16 DIAGNOSIS — Z86010 Personal history of colonic polyps: Secondary | ICD-10-CM

## 2020-06-16 MED ORDER — SUPREP BOWEL PREP KIT 17.5-3.13-1.6 GM/177ML PO SOLN: 1.0000 | Freq: Once | ORAL | 0 refills | Status: AC

## 2020-06-16 NOTE — Progress Notes (Signed)
No egg or soy allergy known to patient  No issues with past sedation with any surgeries or procedures No intubation problems in the past  No FH of Malignant Hyperthermia No diet pills per patient No home 02 use per patient  No blood thinners per patient  Pt denies issues with constipation  No A fib or A flutter  EMMI video to pt or via MyChart  COVID 19 guidelines implemented in [place] today with Pt and RN  Pt is fully vaccinated  for Covid   Suprep Coupon to pt ion [facility] today   Due to the COVID-19 pandemic we are asking patients to follow certain guidelines.  Pt aware of COVID protocols and [facility] guidelines

## 2020-06-23 ENCOUNTER — Encounter: Payer: Self-pay | Admitting: Gastroenterology

## 2020-06-30 ENCOUNTER — Encounter: Payer: BC Managed Care – PPO | Admitting: Gastroenterology

## 2020-07-14 ENCOUNTER — Other Ambulatory Visit: Payer: Self-pay

## 2020-07-14 ENCOUNTER — Encounter: Payer: Self-pay | Admitting: Gastroenterology

## 2020-07-14 ENCOUNTER — Ambulatory Visit (AMBULATORY_SURGERY_CENTER): Payer: BC Managed Care – PPO | Admitting: Gastroenterology

## 2020-07-14 VITALS — BP 106/58 | HR 52 | Temp 98.0°F | Resp 12 | Ht 69.0 in | Wt 251.0 lb

## 2020-07-14 DIAGNOSIS — Z86010 Personal history of colonic polyps: Secondary | ICD-10-CM

## 2020-07-14 DIAGNOSIS — Z1211 Encounter for screening for malignant neoplasm of colon: Secondary | ICD-10-CM | POA: Diagnosis not present

## 2020-07-14 MED ORDER — SODIUM CHLORIDE 0.9 % IV SOLN: 500.0000 mL | Freq: Once | INTRAVENOUS | Status: DC

## 2020-07-14 NOTE — Progress Notes (Signed)
Report to PACU, RN, vss, BBS= Clear.  

## 2020-07-14 NOTE — Progress Notes (Signed)
JB - Check-in  CW - VS  Pt's states no medical or surgical changes since previsit or office visit. 

## 2020-07-14 NOTE — Op Note (Signed)
[place] Patient Name: Johnny Wilcox Procedure Date: 07/14/2020 9:28 AM MRN: [id number] Endoscopist: Mauri Pole , MD Age: 70 Referring MD:  Date of Birth: [birth year] Gender: Male Account #: [id number] Procedure:                Colonoscopy Indications:              High risk colon cancer surveillance: Personal                            history of colonic polyps Medicines:                Monitored Anesthesia Care Procedure:                Pre-Anesthesia Assessment:                           - Prior to the procedure, a History and Physical                            was performed, and patient medications and                            allergies were reviewed. The patient's tolerance of                            previous anesthesia was also reviewed. The risks                            and benefits of the procedure and the sedation                            options and risks were discussed with the patient.                            All questions were answered, and informed consent                            was obtained. Prior Anticoagulants: The patient has                            taken no previous anticoagulant or antiplatelet                            agents. ASA Grade Assessment: II - A patient with                            mild systemic disease. After reviewing the risks                            and benefits, the patient was deemed in                            satisfactory condition to undergo the procedure.  After obtaining informed consent, the colonoscope                            was passed under direct vision. Throughout the                            procedure, the patient's blood pressure, pulse, and                            oxygen saturations were monitored continuously. The                            Olympus [id number]-[id number] (#[id number]) Colonoscope was                            introduced through the anus and advanced  to the the                            cecum, identified by appendiceal orifice and                            ileocecal valve. The colonoscopy was performed                            without difficulty. The patient tolerated the                            procedure well. The quality of the bowel                            preparation was good. The ileocecal valve,                            appendiceal orifice, and rectum were photographed. Scope In: 9:37:58 AM Scope Out: 9:48:26 AM Scope Withdrawal Time: 0 hours 8 minutes 1 second  Total Procedure Duration: 0 hours 10 minutes 28 seconds  Findings:                 The perianal and digital rectal examinations were                            normal.                           Multiple small and large-mouthed diverticula were                            found in the sigmoid colon and descending colon.                           Non-bleeding external and internal hemorrhoids were                            found during retroflexion. The hemorrhoids were  small.                           The exam was otherwise without abnormality. Complications:            No immediate complications. Estimated Blood Loss:     Estimated blood loss was minimal. Impression:               - Moderate diverticulosis in the sigmoid colon and                            in the descending colon.                           - Non-bleeding external and internal hemorrhoids.                           - The examination was otherwise normal.                           - No specimens collected. Recommendation:           - Patient has a contact number available for                            emergencies. The signs and symptoms of potential                            delayed complications were discussed with the                            patient. Return to normal activities tomorrow.                            Written discharge instructions were provided  to the                            patient.                           - Resume previous diet.                           - Continue present medications.                           - Await pathology results.                           - No repeat colonoscopy due to age. Mauri Pole, MD 07/14/2020 9:52:39 AM This report has been signed electronically.

## 2020-07-14 NOTE — Patient Instructions (Signed)
HANDOUTS PROVIDED ON: DIVERTICULOSIS & HEMORRHOIDS  You may resume your previous diet and medication schedule.  Thank you for allowing [place] to care for you today!!!   YOU HAD AN ENDOSCOPIC PROCEDURE TODAY AT [place]:   Refer to the procedure report that was given to you for any specific questions about what was found during the examination.  If the procedure report does not answer your questions, please call your gastroenterologist to clarify.  If you requested that your care partner not be given the details of your procedure findings, then the procedure report has been included in a sealed envelope for you to review at your convenience later.  YOU SHOULD EXPECT: Some feelings of bloating in the abdomen. Passage of more gas than usual.  Walking can help get rid of the air that was put into your GI tract during the procedure and reduce the bloating. If you had a lower endoscopy (such as a colonoscopy or flexible sigmoidoscopy) you may notice spotting of blood in your stool or on the toilet paper. If you underwent a bowel prep for your procedure, you may not have a normal bowel movement for a few days.  Please Note:  You might notice some irritation and congestion in your nose or some drainage.  This is from the oxygen used during your procedure.  There is no need for concern and it should clear up in a day or so.  SYMPTOMS TO REPORT IMMEDIATELY:   Following lower endoscopy (colonoscopy or flexible sigmoidoscopy):  Excessive amounts of blood in the stool  Significant tenderness or worsening of abdominal pains  Swelling of the abdomen that is new, acute  Fever of 100F or higher  For urgent or emergent issues, a gastroenterologist can be reached at any hour by calling [phone]. Do not use MyChart messaging for urgent concerns.    DIET:  We do recommend a small meal at first, but then you may proceed to your regular diet.  Drink plenty of fluids but you should avoid  alcoholic beverages for 24 hours.  ACTIVITY:  You should plan to take it easy for the rest of today and you should NOT DRIVE or use heavy machinery until tomorrow (because of the sedation medicines used during the test).    FOLLOW UP: Our staff will call the number listed on your records Wednesday morning betwen 7:15 am and 8:15 am to check on you and address any questions or concerns that you may have regarding the information given to you following your procedure. If we do not reach you, we will leave a message.  We will attempt to reach you two times.  During this call, we will ask if you have developed any symptoms of COVID 19. If you develop any symptoms (ie: fever, flu-like symptoms, shortness of breath, cough etc.) before then, please call [phone].  If you test positive for Covid 19 in the 2 weeks post procedure, please call and report this information to [place].    If any biopsies were taken you will be contacted by phone or by letter within the next 1-3 weeks.  Please call us at [phone] if you have not heard about the biopsies in 3 weeks.    SIGNATURES/CONFIDENTIALITY: You and/or your care partner have signed paperwork which will be entered into your electronic medical record.  These signatures attest to the fact that that the information above on your After Visit Summary has been reviewed and is understood.  Full responsibility  of the confidentiality of this discharge information lies with you and/or your care-partner.

## 2020-07-15 DIAGNOSIS — I10 Essential (primary) hypertension: Secondary | ICD-10-CM | POA: Diagnosis not present

## 2020-07-15 DIAGNOSIS — H35033 Hypertensive retinopathy, bilateral: Secondary | ICD-10-CM | POA: Diagnosis not present

## 2020-07-16 ENCOUNTER — Telehealth: Payer: Self-pay | Admitting: *Deleted

## 2020-07-16 NOTE — Telephone Encounter (Signed)
  Follow up Call-  Call back number 07/14/2020  Post procedure Call Back phone  # [phone] cell  Permission to leave phone message Yes  Some recent data might be hidden     Patient questions:  Do you have a fever, pain , or abdominal swelling? No. Pain Score  0 *  Have you tolerated food without any problems? Yes.    Have you been able to return to your normal activities? Yes.    Do you have any questions about your discharge instructions: Diet   No. Medications  No. Follow up visit  No.  Do you have questions or concerns about your Care? No.  Actions: * If pain score is 4 or above: No action needed, pain <4.  1. Have you developed a fever since your procedure? no  2.   Have you had an respiratory symptoms (SOB or cough) since your procedure? no  3.   Have you tested positive for COVID 19 since your procedure no  4.   Have you had any family members/close contacts diagnosed with the COVID 19 since your procedure?  no   If yes to any of these questions please route to Joylene John, RN and Joella Prince, RN

## 2021-01-10 ENCOUNTER — Other Ambulatory Visit: Payer: Self-pay | Admitting: Family Medicine

## 2021-01-13 DIAGNOSIS — H40053 Ocular hypertension, bilateral: Secondary | ICD-10-CM | POA: Diagnosis not present

## 2021-01-27 ENCOUNTER — Telehealth: Payer: Self-pay | Admitting: Cardiology

## 2021-01-27 MED ORDER — ATORVASTATIN CALCIUM 80 MG PO TABS: 80.0000 mg | ORAL_TABLET | Freq: Every day | ORAL | 0 refills | Status: DC

## 2021-01-27 NOTE — Telephone Encounter (Signed)
Pt has upcoming appointment with Terie Purser, NP, in September.  [facility]

## 2021-01-27 NOTE — Telephone Encounter (Signed)
*  STAT* If patient is at the pharmacy, call can be transferred to refill team.   1. Which medications need to be refilled? (please list name of each medication and dose if known) atorvastatin (LIPITOR) 80 MG tablet  2. Which pharmacy/location (including street and city if local pharmacy) is medication to be sent to? [id number] [place] [place] [facility] [address]  3. Do they need a 30 day or 90 day supply? 90 day supply  Pt is out of medication.He has a appt on 9/29 at 8 with CLaurann [place]

## 2021-01-28 ENCOUNTER — Telehealth: Payer: Self-pay | Admitting: Cardiology

## 2021-01-28 MED ORDER — LISINOPRIL-HYDROCHLOROTHIAZIDE 20-25 MG PO TABS: 1.0000 | ORAL_TABLET | Freq: Every day | ORAL | 0 refills | Status: DC

## 2021-01-28 NOTE — Telephone Encounter (Signed)
*  STAT* If patient is at the pharmacy, call can be transferred to refill team.   1. Which medications need to be refilled? (please list name of each medication and dose if known) lisinopril-hydrochlorothiazide (ZESTORETIC) 20-25 MG tablet  2. Which pharmacy/location (including street and city if local pharmacy) is medication to be sent to? [id number] [place]  - [address]  3. Do they need a 30 day or 90 day supply? 90 ds

## 2021-02-20 ENCOUNTER — Ambulatory Visit
Admission: RE | Admit: 2021-02-20 | Discharge: 2021-02-20 | Disposition: A | Discharge status: Home / Self Care | Payer: BC Managed Care – PPO | Source: Ambulatory Visit | Attending: Emergency Medicine | Admitting: Emergency Medicine

## 2021-02-20 ENCOUNTER — Other Ambulatory Visit: Payer: Self-pay

## 2021-02-20 VITALS — BP 125/63 | HR 65 | Temp 98.8°F | Resp 18

## 2021-02-20 DIAGNOSIS — H9202 Otalgia, left ear: Secondary | ICD-10-CM | POA: Diagnosis not present

## 2021-02-20 DIAGNOSIS — H6122 Impacted cerumen, left ear: Secondary | ICD-10-CM

## 2021-02-20 MED ORDER — AMOXICILLIN 875 MG PO TABS: 875.0000 mg | ORAL_TABLET | Freq: Two times a day (BID) | ORAL | 0 refills | Status: AC

## 2021-02-20 NOTE — Discharge Instructions (Addendum)
Take the amoxicillin as directed.  Follow-up as scheduled with your ENT.

## 2021-02-20 NOTE — ED Provider Notes (Signed)
Johnny Wilcox    CSN: [id number] Arrival date & time: 02/20/21  1451      History   Chief Complaint Chief Complaint  Patient presents with   Otalgia     HPI Johnny Wilcox is a 70 y.o. male.  Patient presents with left ear pain, fullness, decreased hearing x4 days.  He denies fever, chills, sore throat, cough, shortness of breath, or other symptoms.  Treatment attempted at home with Tylenol and ibuprofen.  He reports history of otitis media and recurrent earwax buildup.  His medical history includes hypertension.  The history is provided by the patient and medical records.   Past Medical History:  Diagnosis Date   Allergy    CAD in native artery    a. abnormal nuc 2015 - s/p DES to RCA, minimal disease in prox LAD, EF 55-65%.   Cataract    removed from left eye   ED (erectile dysfunction)    Hyperlipidemia    Hypertension     Patient Active Problem List   Diagnosis Date Noted   Annual physical exam 03/21/2020   Chondromalacia, right knee 02/18/2017   Hearing loss at 3000 bilat 03/2014 03/20/2014   CTS (carpal tunnel syndrome)-bilat 2015 03/20/2014   Full dentures 03/20/2014   CAD (coronary artery disease) 10/04/2013   Angina pectoris ([place]) 09/21/2013   ED (erectile dysfunction) 04/04/2012   HTN (hypertension) 03/22/2012   BMI 34.0-34.9,adult 03/22/2012   Diverticulosis Colonoscopy 2003 03/22/2012   BPH (benign prostatic hyperplasia)-mild July 2003 03/22/2012   Hyperlipidemia-Mild 03/22/2012    Past Surgical History:  Procedure Laterality Date   CATARACT EXTRACTION Right    COLONOSCOPY     CORONARY STENT PLACEMENT     EYE SURGERY     child injury-repair.     KNEE ARTHROSCOPY Right 02/18/2017   Procedure: ARTHROSCOPY RIGHT KNEE WITH PARTIAL MEDIAL MENISECTOMY, MEDIAL CHONDROPLASTY AND PATELLA, AND MEDIAL PLICO EXCISION;  Surgeon: [name], MD;  Location: [facility];  Service: Orthopedics;  Laterality: Right;   LEFT HEART CATHETERIZATION WITH CORONARY  ANGIOGRAM N/A 09/21/2013   Procedure: LEFT HEART CATHETERIZATION WITH CORONARY ANGIOGRAM;  Surgeon: [name], MD;  Location: [facility] CATH LAB;  Service: Cardiovascular;  Laterality: N/A;   MULTIPLE EXTRACTIONS WITH ALVEOLOPLASTY  12/06/2011   Procedure: MULTIPLE EXTRACION WITH ALVEOLOPLASTY;  Surgeon: [name], DDS;  Location: [place];  Service: Oral Surgery;  Laterality: Bilateral;  Biopsy tissue right vestibule   PERCUTANEOUS CORONARY STENT INTERVENTION (PCI-S)  09/21/2013   Procedure: PERCUTANEOUS CORONARY STENT INTERVENTION (PCI-S);  Surgeon: [name], MD;  Location: [facility] CATH LAB;  Service: Cardiovascular;;   POLYPECTOMY     VASECTOMY         Home Medications    Prior to Admission medications   Medication Sig Start Date End Date Taking? Authorizing Provider  amoxicillin (AMOXIL) 875 MG tablet Take 1 tablet (875 mg total) by mouth 2 (two) times daily for 7 days. 02/20/21 02/27/21 Yes Sharion Balloon, NP  aspirin EC 81 MG tablet Take 81 mg by mouth daily.    [provider]  atorvastatin (LIPITOR) 80 MG tablet Take 1 tablet (80 mg total) by mouth daily. 01/27/21   Freada Bergeron, MD  lisinopril-hydrochlorothiazide (ZESTORETIC) 20-25 MG tablet Take 1 tablet by mouth daily. 01/28/21   Charlie Pitter, PA-C    Family History Family History  Problem Relation Age of Onset   Heart disease Father    Heart disease Mother    Stroke  Mother    Hypertension Sister    Colon polyps Sister    Colon cancer Neg Hx    Esophageal cancer Neg Hx    Rectal cancer Neg Hx    Stomach cancer Neg Hx     Social History Social History   Tobacco Use   Smoking status: Never   Smokeless tobacco: Current    Types: Chew    Last attempt to quit: 09/06/2011  Vaping Use   Vaping Use: Never used  Substance Use Topics   Alcohol use: Yes    Comment:  maybe 3 beers a month- rare    Drug use: No     Allergies   Patient has no known allergies.   Review of Systems Review of Systems   Constitutional:  Negative for chills and fever.  HENT:  Positive for ear pain and hearing loss. Negative for sore throat.   Respiratory:  Negative for cough and shortness of breath.   Cardiovascular:  Negative for chest pain and palpitations.  Gastrointestinal:  Negative for abdominal pain and vomiting.  Skin:  Negative for color change and rash.  All other systems reviewed and are negative.   Physical Exam Triage Vital Signs ED Triage Vitals  Enc Vitals Group     BP      Pulse      Resp      Temp      Temp src      SpO2      Weight      Height      Head Circumference      Peak Flow      Pain Score      Pain Loc      Pain Edu?      Excl. in [place]?    No data found.  Updated Vital Signs BP 125/63   Pulse 65   Temp 98.8 F (37.1 C)   Resp 18   SpO2 95%   Visual Acuity Right Eye Distance:   Left Eye Distance:   Bilateral Distance:    Right Eye Near:   Left Eye Near:    Bilateral Near:     Physical Exam Vitals and nursing note reviewed.  Constitutional:      General: He is not in acute distress.    Appearance: He is well-developed. He is not ill-appearing.  HENT:     Head: Normocephalic and atraumatic.     Right Ear: Tympanic membrane and ear canal normal.     Left Ear: There is impacted cerumen.     Ears:     Comments: Unable to visualize left TM due to cerumen impaction.    Nose: Nose normal.     Mouth/Throat:     Mouth: Mucous membranes are moist.     Pharynx: Oropharynx is clear.  Eyes:     Conjunctiva/sclera: Conjunctivae normal.  Cardiovascular:     Rate and Rhythm: Normal rate and regular rhythm.     Heart sounds: Normal heart sounds.  Pulmonary:     Effort: Pulmonary effort is normal. No respiratory distress.     Breath sounds: Normal breath sounds.  Abdominal:     Palpations: Abdomen is soft.     Tenderness: There is no abdominal tenderness.  Musculoskeletal:     Cervical back: Neck supple.  Skin:    General: Skin is warm and dry.   Neurological:     General: No focal deficit present.     Mental Status: He is alert  and oriented to person, place, and time.  Psychiatric:        Mood and Affect: Mood normal.        Behavior: Behavior normal.     UC Treatments / Results  Labs (all labs ordered are listed, but only abnormal results are displayed) Labs Reviewed - No data to display  EKG   Radiology No results found.  Procedures Procedures (including critical care time)  Medications Ordered in UC Medications - No data to display  Initial Impression / Assessment and Plan / UC Course  I have reviewed the triage vital signs and the nursing notes.  Pertinent labs & imaging results that were available during my care of the patient were reviewed by me and considered in my medical decision making (see chart for details).   Impacted cerumen of left ear.  Acute left otalgia.  Patient unable to tolerate cerumen removal.  I am unable to visualize his TM but he has acute left ear pain.  He reports history of otitis media.  Because of his history of otitis media and his acute left ear pain, treating today with amoxicillin.  Instructed him to follow-up with his ENT; he states they are unable to see him until mid-October and he has scheduled an appointment.  Education provided on cerumen buildup and ear pain.  Patient agrees to plan of care.   Final Clinical Impressions(s) / UC Diagnoses   Final diagnoses:  Impacted cerumen of left ear  Otalgia of left ear     Discharge Instructions      Take the amoxicillin as directed.  Follow-up as scheduled with your ENT.       ED Prescriptions     Medication Sig Dispense Auth. Provider   amoxicillin (AMOXIL) 875 MG tablet Take 1 tablet (875 mg total) by mouth 2 (two) times daily for 7 days. 14 tablet Sharion Balloon, NP      PDMP not reviewed this encounter.   Sharion Balloon, NP 02/20/21 [phone]

## 2021-02-20 NOTE — ED Triage Notes (Signed)
Pt presents c/o left ear pain and drainage for past 4 days

## 2021-02-26 DIAGNOSIS — M5442 Lumbago with sciatica, left side: Secondary | ICD-10-CM | POA: Diagnosis not present

## 2021-02-26 DIAGNOSIS — M25512 Pain in left shoulder: Secondary | ICD-10-CM | POA: Diagnosis not present

## 2021-02-26 DIAGNOSIS — M5412 Radiculopathy, cervical region: Secondary | ICD-10-CM | POA: Diagnosis not present

## 2021-03-02 DIAGNOSIS — M5412 Radiculopathy, cervical region: Secondary | ICD-10-CM | POA: Diagnosis not present

## 2021-03-02 DIAGNOSIS — M25512 Pain in left shoulder: Secondary | ICD-10-CM | POA: Diagnosis not present

## 2021-03-04 DIAGNOSIS — M25512 Pain in left shoulder: Secondary | ICD-10-CM | POA: Diagnosis not present

## 2021-03-04 DIAGNOSIS — M5412 Radiculopathy, cervical region: Secondary | ICD-10-CM | POA: Diagnosis not present

## 2021-03-10 DIAGNOSIS — M25512 Pain in left shoulder: Secondary | ICD-10-CM | POA: Diagnosis not present

## 2021-03-10 DIAGNOSIS — M5412 Radiculopathy, cervical region: Secondary | ICD-10-CM | POA: Diagnosis not present

## 2021-03-12 ENCOUNTER — Other Ambulatory Visit: Payer: Self-pay

## 2021-03-12 ENCOUNTER — Ambulatory Visit (HOSPITAL_BASED_OUTPATIENT_CLINIC_OR_DEPARTMENT_OTHER): Payer: BC Managed Care – PPO | Admitting: Family

## 2021-03-12 ENCOUNTER — Encounter (HOSPITAL_BASED_OUTPATIENT_CLINIC_OR_DEPARTMENT_OTHER): Payer: Self-pay | Admitting: Family

## 2021-03-12 VITALS — BP 122/70 | HR 51 | Ht 70.0 in | Wt 239.4 lb

## 2021-03-12 DIAGNOSIS — I10 Essential (primary) hypertension: Secondary | ICD-10-CM

## 2021-03-12 DIAGNOSIS — E785 Hyperlipidemia, unspecified: Secondary | ICD-10-CM

## 2021-03-12 DIAGNOSIS — I25118 Atherosclerotic heart disease of native coronary artery with other forms of angina pectoris: Secondary | ICD-10-CM

## 2021-03-12 MED ORDER — ATORVASTATIN CALCIUM 80 MG PO TABS: 80.0000 mg | ORAL_TABLET | Freq: Every day | ORAL | 3 refills | Status: DC

## 2021-03-12 MED ORDER — LISINOPRIL-HYDROCHLOROTHIAZIDE 20-25 MG PO TABS
1.0000 | ORAL_TABLET | Freq: Every day | ORAL | 3 refills | Status: DC
Start: 2021-03-12 — End: 2022-03-12

## 2021-03-12 NOTE — Patient Instructions (Signed)
Medication Instructions:  Continue your current medications.   *If you need a refill on your cardiac medications before your next appointment, please call your pharmacy*   Lab Work: Your physician recommends that you return for lab work today: lipid panel, CMP   If you have labs (blood work) drawn today and your tests are completely normal, you will receive your results only by: MyChart Message (if you have MyChart) OR A paper copy in the mail If you have any lab test that is abnormal or we need to change your treatment, we will call you to review the results.   Testing/Procedures: Your EKG today showed sinus bradycardia which is a stable finding .  Follow-Up: At [facility], you and your health needs are our priority.  As part of our continuing mission to provide you with exceptional heart care, we have created designated Provider Care Teams.  These Care Teams include your primary Cardiologist (physician) and Advanced Practice Providers (APPs -  Physician Assistants and Nurse Practitioners) who all work together to provide you with the care you need, when you need it.  We recommend signing up for the patient portal called "MyChart".  Sign up information is provided on this After Visit Summary.  MyChart is used to connect with patients for Virtual Visits (Telemedicine).  Patients are able to view lab/test results, encounter notes, upcoming appointments, etc.  Non-urgent messages can be sent to your provider as well.   To learn more about what you can do with MyChart, go to [email or web address].    Your next appointment:   1 year(s)  The format for your next appointment:   In Person  Provider:   You may see Freada Bergeron, MD or one of the following Advanced Practice Providers on your designated Care Team:   Richardson Dopp, PA-C Vin [place], [place]   Other Instructions  Heart Healthy Diet Recommendations: A low-salt diet is recommended. Meats should be grilled, baked, or  boiled. Avoid fried foods. Focus on lean protein sources like fish or chicken with vegetables and fruits. The American Heart Association is a Microbiologist!    Exercise recommendations: The American Heart Association recommends 150 minutes of moderate intensity exercise weekly. Try 30 minutes of moderate intensity exercise 4-5 times per week. This could include walking, jogging, or swimming.

## 2021-03-12 NOTE — Progress Notes (Signed)
Office Visit    Patient Name: Johnny Wilcox Date of Encounter: 03/12/2021  PCP:  Gifford Shave, MD   [facility]  Cardiologist:  Freada Bergeron, MD  Advanced Practice Provider:  No care team member to display Electrophysiologist:  None      Chief Complaint    Johnny Wilcox is a 70 y.o. male with a hx of CAD s/p DES to RCA, hypertension, hyperlipidemia, ED, to tobacco use, obesity presents today for follow-up of CAD  Past Medical History    Past Medical History:  Diagnosis Date   Allergy    CAD in native artery    a. abnormal nuc 2015 - s/p DES to RCA, minimal disease in prox LAD, EF 55-65%.   Cataract    removed from left eye   ED (erectile dysfunction)    Hyperlipidemia    Hypertension    Past Surgical History:  Procedure Laterality Date   CATARACT EXTRACTION Right    COLONOSCOPY     CORONARY STENT PLACEMENT     EYE SURGERY     child injury-repair.     KNEE ARTHROSCOPY Right 02/18/2017   Procedure: ARTHROSCOPY RIGHT KNEE WITH PARTIAL MEDIAL MENISECTOMY, MEDIAL CHONDROPLASTY AND PATELLA, AND MEDIAL PLICO EXCISION;  Surgeon: [name], MD;  Location: [facility];  Service: Orthopedics;  Laterality: Right;   LEFT HEART CATHETERIZATION WITH CORONARY ANGIOGRAM N/A 09/21/2013   Procedure: LEFT HEART CATHETERIZATION WITH CORONARY ANGIOGRAM;  Surgeon: [name], MD;  Location: [facility] CATH LAB;  Service: Cardiovascular;  Laterality: N/A;   MULTIPLE EXTRACTIONS WITH ALVEOLOPLASTY  12/06/2011   Procedure: MULTIPLE EXTRACION WITH ALVEOLOPLASTY;  Surgeon: [name], DDS;  Location: [place];  Service: Oral Surgery;  Laterality: Bilateral;  Biopsy tissue right vestibule   PERCUTANEOUS CORONARY STENT INTERVENTION (PCI-S)  09/21/2013   Procedure: PERCUTANEOUS CORONARY STENT INTERVENTION (PCI-S);  Surgeon: [name], MD;  Location: [facility] CATH LAB;  Service: Cardiovascular;;   POLYPECTOMY     VASECTOMY      Allergies  No Known Allergies  History of  Present Illness    Johnny Wilcox is a 70 y.o. male with a hx of CAD s/p DES to RCA, hypertension, hyperlipidemia, ED, to tobacco use, obesity last seen 10/31/2019.  2015 he underwent stress testing which was abnormal.  Subsequent cardiac catheterization with 50% proximal LAD, 90% RCA mid RCA s/p DES, LVEF 55 to 65%.  He was seen in 2018 doing overall well from cardiac perspective.  He was lost to follow-up until 10/31/2019 at which time he was still doing well and staying very active doing physical labor.  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  It was normal with good exercise capacity.  He presents today for follow up. His granddaughter, her husband, and their one year old daughter live with him. He works a very physically active job. Reports no shortness of breath nor dyspnea on exertion. Reports no chest pain, pressure, or tightness. No edema, orthopnea, PND. Reports no palpitations.    EKGs/Labs/Other Studies Reviewed:   The following studies were reviewed today:  ETT 01/30/2020 There was no ST segment deviation noted during stress.   Exercise time: 8 minutes 9 seconds Workload: 10.1 METS, good exercise capacity Test stopped due to: Fatigue and shortness of breath BP response: Elevated blood pressure response without true hypertensive response to exercise.  Peak blood pressure 206/76 mmHg HR response: Normal Rhythm: Sinus rhythm   Upsloping ST  segment changes near peak stress.  Occasional horizontal ST segment with beat-to-beat variability, without evidence of ischemia.    Normal stress ECG with good exercise capacity.  ____________________________________________________________  [facility] 09/2013 Cardiac Catheterization Procedure Note   Name: Johnny Wilcox MRN: [id number] DOB: [birth year]   Procedure: Left Heart Cath, Selective Coronary Angiography, LV angiography, PTCA and stenting of the mid RCA   Indication:70 yo WM with recent onset of angina class IV.  Myoview showed an inferior wall perfusion abnormality.   Procedural Details:  The right wrist was prepped, draped, and anesthetized with 1% lidocaine. Using the modified Seldinger technique, a 6 French slender sheath was introduced into the right radial artery. 3 mg of verapamil was administered through the sheath, weight-based unfractionated heparin was administered intravenously. Standard Judkins catheters were used for selective coronary angiography and left ventriculography. Catheter exchanges were performed over an exchange length guidewire.   PROCEDURAL FINDINGS Hemodynamics: AO 108/57 mean 79 mm Hg LV 105/16 mm Hg              Coronary angiography: Coronary dominance: right   Left mainstem: Normal   Left anterior descending (LAD): There is smooth narrowing of the proximal LAD up to 30%. The first diagonal is a large branch and is normal.   Left circumflex (LCx): Normal.   Right coronary artery (RCA): Inferior takeoff. There is a 90% stenosis in the mid RCA at the site of takeoff of the RV marginal branch.   Left ventriculography: Left ventricular systolic function is normal, LVEF is estimated at 55-65%, there is no significant mitral regurgitation    PCI Note:  Following the diagnostic procedure, the decision was made to proceed with PCI.  Weight-based heparin was given for anticoagulation. Brilinta 180 mg was given orally. Once a therapeutic ACT was achieved, a 6 [place] MP A1 guide catheter was inserted.  A prowater coronary guidewire was used to cross the lesion.  The lesion was predilated with a 2.5 mm balloon.  The lesion was then stented with a 4.0 x 23 mm Alpine stent.  The stent was postdilated with a 4.0 noncompliant balloon.  Following PCI, there was 0% residual stenosis and TIMI-3 flow. Final angiography confirmed an excellent result. The patient tolerated the procedure well. There were no immediate procedural complications. A TR band was used for radial hemostasis. The patient  was transferred to the post catheterization recovery area for further monitoring.   PCI Data: Vessel - RCA/Segment - mid Percent Stenosis (pre)  90% TIMI-flow 3 Stent 4.0 x 23 mm Alpine stent Percent Stenosis (post) 0% TIMI-flow (post) 3   Final Conclusions:   1. Single vessel obstructive CAD 2. Normal LV function  3. Successful stenting of the mid RCA with a DES.   Recommendations:  Continue dual antiplatelet therapy for one year. Start statin for risk factor modification. Anticipate DC in am.   Ander Slade [facility] 09/21/2013, 10:33 AM     EKG:  EKG is  ordered today.  The ekg ordered today demonstrates SB 51 bpm with no acute ST/T wave changes.   Recent Labs: No results found for requested labs within last 8760 hours.  Recent Lipid Panel    Component Value Date/Time   CHOL 104 11/28/2019 0904   TRIG 197 (H) 11/28/2019 0904   HDL 28 (L) 11/28/2019 0904   CHOLHDL 3.7 11/28/2019 0904   CHOLHDL 3 01/14/2015 0901   VLDL 21.6 01/14/2015 0901   LDLCALC 44 11/28/2019 0904    Home Medications  No outpatient medications have been marked as taking for the 03/12/21 encounter (Office Visit) with Loel Dubonnet, NP.     Review of Systems      All other systems reviewed and are otherwise negative except as noted above.  Physical Exam    VS:  BP 122/70   Pulse (!) 51   Ht 5\' 10"  (1.778 m)   Wt 239 lb 6.4 oz (108.6 kg)   SpO2 96%   BMI 34.35 kg/m  , BMI Body mass index is 34.35 kg/m.  Wt Readings from Last 3 Encounters:  03/12/21 239 lb 6.4 oz (108.6 kg)  07/14/20 251 lb (113.9 kg)  06/16/20 251 lb (113.9 kg)    GEN: Well nourished, well developed, in no acute distress. HEENT: normal. Neck: Supple, no JVD, carotid bruits, or masses. Cardiac: RRR, no murmurs, rubs, or gallops. No clubbing, cyanosis, edema.  Radials/PT 2+ and equal bilaterally.  Respiratory:  Respirations regular and unlabored, clear to auscultation bilaterally. GI: Soft, nontender,  nondistended. MS: No deformity or atrophy. Skin: Warm and dry, no rash. Neuro:  Strength and sensation are intact. Psych: Normal affect.  Assessment & Plan    CAD s/p DES to RCA - Stable with no anginal symptoms. No indication for ischemic evaluation.  GDMT includes aspirin, statin. No BB due to baseline bradycardia. Heart healthy diet and regular cardiovascular exercise encouraged.    HTN - BP well controlled. Continue current antihypertensive regimen of Lisinopril-HCTZ. CMP today for monitoring.   HLD - Continue Atorvastatin 80mg  daily. Lipid panel, CMP today for monitoring.    Disposition: Follow up in 1 year(s) with Dr. Johney Frame or APP.  Signed, Loel Dubonnet, NP 03/12/2021, 8:30 AM [place]

## 2021-03-13 LAB — COMPREHENSIVE METABOLIC PANEL
ALT: 40 IU/L (ref 0–44)
AST: 21 IU/L (ref 0–40)
Albumin/Globulin Ratio: 1.8 (ref 1.2–2.2)
Albumin: 4.3 g/dL (ref 3.7–4.7)
Alkaline Phosphatase: 84 IU/L (ref 44–121)
BUN/Creatinine Ratio: 13 (ref 10–24)
BUN: 16 mg/dL (ref 8–27)
Bilirubin Total: 0.6 mg/dL (ref 0.0–1.2)
CO2: 24 mmol/L (ref 20–29)
Calcium: 9.3 mg/dL (ref 8.6–10.2)
Chloride: 101 mmol/L (ref 96–106)
Creatinine, Ser: 1.19 mg/dL (ref 0.76–1.27)
Globulin, Total: 2.4 g/dL (ref 1.5–4.5)
Glucose: 110 mg/dL — ABNORMAL HIGH (ref 70–99)
Potassium: 4.4 mmol/L (ref 3.5–5.2)
Sodium: 144 mmol/L (ref 134–144)
Total Protein: 6.7 g/dL (ref 6.0–8.5)
eGFR: 65 mL/min/{1.73_m2} (ref >59)

## 2021-03-13 LAB — LIPID PANEL
Chol/HDL Ratio: 3.1 ratio (ref 0.0–5.0)
Cholesterol, Total: 113 mg/dL (ref 100–199)
HDL: 36 mg/dL — ABNORMAL LOW (ref >39)
LDL Chol Calc (NIH): 55 mg/dL (ref 0–99)
Triglycerides: 122 mg/dL (ref 0–149)
VLDL Cholesterol Cal: 22 mg/dL (ref 5–40)

## 2021-03-27 DIAGNOSIS — H6123 Impacted cerumen, bilateral: Secondary | ICD-10-CM | POA: Diagnosis not present

## 2021-11-28 ENCOUNTER — Other Ambulatory Visit: Payer: Self-pay

## 2021-11-28 ENCOUNTER — Emergency Department (HOSPITAL_BASED_OUTPATIENT_CLINIC_OR_DEPARTMENT_OTHER)
Admission: EM | Admit: 2021-11-28 | Discharge: 2021-11-28 | Disposition: A | Discharge status: Home / Self Care | Payer: BC Managed Care – PPO | Attending: Emergency Medicine | Admitting: Emergency Medicine

## 2021-11-28 ENCOUNTER — Encounter (HOSPITAL_BASED_OUTPATIENT_CLINIC_OR_DEPARTMENT_OTHER): Payer: Self-pay

## 2021-11-28 DIAGNOSIS — W132XXA Fall from, out of or through roof, initial encounter: Secondary | ICD-10-CM | POA: Insufficient documentation

## 2021-11-28 DIAGNOSIS — I10 Essential (primary) hypertension: Secondary | ICD-10-CM | POA: Diagnosis not present

## 2021-11-28 DIAGNOSIS — Z7982 Long term (current) use of aspirin: Secondary | ICD-10-CM | POA: Insufficient documentation

## 2021-11-28 DIAGNOSIS — Z79899 Other long term (current) drug therapy: Secondary | ICD-10-CM | POA: Insufficient documentation

## 2021-11-28 DIAGNOSIS — Z23 Encounter for immunization: Secondary | ICD-10-CM | POA: Insufficient documentation

## 2021-11-28 DIAGNOSIS — S0101XA Laceration without foreign body of scalp, initial encounter: Secondary | ICD-10-CM | POA: Diagnosis not present

## 2021-11-28 DIAGNOSIS — S0990XA Unspecified injury of head, initial encounter: Secondary | ICD-10-CM

## 2021-11-28 MED ORDER — LIDOCAINE-EPINEPHRINE (PF) 2 %-1:200000 IJ SOLN
10.0000 mL | Freq: Once | INTRAMUSCULAR | Status: AC
  Administered 2021-11-28: 10 mL via INTRADERMAL
  Filled 2021-11-28: qty 20

## 2021-11-28 MED ORDER — TETANUS-DIPHTH-ACELL PERTUSSIS 5-2.5-18.5 LF-MCG/0.5 IM SUSY
0.5000 mL | PREFILLED_SYRINGE | Freq: Once | INTRAMUSCULAR | Status: AC
  Administered 2021-11-28: 0.5 mL via INTRAMUSCULAR
  Filled 2021-11-28: qty 0.5

## 2021-11-28 NOTE — Discharge Instructions (Signed)
Please return to the ED immediately if you develop vision changes, vomiting, changes in mental status.  1. Medications: Tylenol or ibuprofen for pain, usual home medications 2. Treatment: ice for swelling, keep wound clean with warm soap and water and keep bandage dry, do not submerge in water for 24 hours 3. Follow Up: Please return or follow up with primary care provider in 7-10 days to have your stitches/staples removed or sooner if you have concerns. Return to the emergency department for increased redness, drainage of pus from the wound, or fevers/chills.   WOUND CARE  Keep area clean and dry for 24 hours. Do not remove bandage, if applied.  After 24 hours, remove bandage and wash wound gently with mild soap and warm water. Reapply a new bandage after cleaning wound, if directed.   Continue daily cleansing with soap and water until stitches/staples are removed.  Do not apply any ointments or creams to the wound while stitches/staples are in place, as this may cause delayed healing. Return if you experience any of the following signs of infection: Swelling, redness, pus drainage, streaking, fever >101.0 F  Return if you experience excessive bleeding that does not stop after 15-20 minutes of constant, firm pressure.

## 2021-11-28 NOTE — ED Provider Notes (Addendum)
[facility] EMERGENC[facility] DEPT Provider Note   CSN: [id number] Arrival date & time: 11/28/21  1421     History  Chief Complaint  Patient presents with   Fall   Head Laceration    Johnny Wilcox is a 71 y.o. male with history of hypertension and hyperlipidemia presents to the ED after a fall that occurred just prior to arrival.  Patient states that he was working on a roof and he had worked himself through a small opening in the roof and unfortunately fell down about 10 feet.  He landed on his feet and then fell down onto his right hip.  He believes something fell down after him and landed on his head, possibly his nail gun.  He is immediately ambulatory after the scene and denies pain in his lower extremities or upper extremities.  He comes here today for evaluation of a laceration at the top of the scalp.  He denies vision changes, headache, neck pain, changes in mental status, nausea, vomiting.  He has no systemic complaints.  He is not on blood thinners.   Fall  Head Laceration       Home Medications Prior to Admission medications   Medication Sig Start Date End Date Taking? Authorizing Provider  aspirin EC 81 MG tablet Take 81 mg by mouth daily.    [provider]  atorvastatin (LIPITOR) 80 MG tablet Take 1 tablet (80 mg total) by mouth daily. 03/12/21   Loel Dubonnet, NP  lisinopril-hydrochlorothiazide (ZESTORETIC) 20-25 MG tablet Take 1 tablet by mouth daily. 03/12/21   Loel Dubonnet, NP      Allergies    Patient has no known allergies.    Review of Systems   Review of Systems  Physical Exam Updated Vital Signs BP 133/75   Pulse 66   Temp 97.8 F (36.6 C)   Resp 18   Ht '5\' 10"'$  (1.778 m)   Wt 103.4 kg   SpO2 100%   BMI 32.71 kg/m  Physical Exam Vitals and nursing note reviewed.  Constitutional:      General: He is not in acute distress.    Appearance: He is not ill-appearing.  HENT:     Head: Atraumatic.     Comments: Picture  below of scalp laceration.   No battle sign, raccoon eyes or hemotympanum bilaterally.   Eyes:     Extraocular Movements: Extraocular movements intact.     Conjunctiva/sclera: Conjunctivae normal.     Pupils: Pupils are equal, round, and reactive to light.  Cardiovascular:     Rate and Rhythm: Normal rate and regular rhythm.     Pulses: Normal pulses.     Heart sounds: No murmur heard. Pulmonary:     Effort: Pulmonary effort is normal. No respiratory distress.     Breath sounds: Normal breath sounds.  Abdominal:     General: Abdomen is flat. There is no distension.     Palpations: Abdomen is soft.     Tenderness: There is no abdominal tenderness.  Musculoskeletal:        General: Normal range of motion.     Cervical back: Normal range of motion.     Comments: All upper and lower extremities with full range of motion, no tenderness to palpation, bruising or palpable deformities.  Skin:    General: Skin is warm and dry.     Capillary Refill: Capillary refill takes less than 2 seconds.  Neurological:     General: No focal deficit  present.     Mental Status: He is alert.  Psychiatric:        Mood and Affect: Mood normal.      ED Results / Procedures / Treatments   Labs (all labs ordered are listed, but only abnormal results are displayed) Labs Reviewed - No data to display  EKG None  Radiology No results found.  Procedures .Marland KitchenLaceration Repair  Date/Time: 11/28/2021 7:09 PM  Performed by: Tonye Pearson, PA-C Authorized by: Tonye Pearson, PA-C   Consent:    Consent obtained:  Verbal   Consent given by:  Patient   Risks discussed:  Infection, need for additional repair, pain, poor cosmetic result and poor wound healing   Alternatives discussed:  No treatment and delayed treatment Universal protocol:    Procedure explained and questions answered to patient or proxy's satisfaction: yes     Relevant documents present and verified: yes     Test results available:  yes     Imaging studies available: yes     Required blood products, implants, devices, and special equipment available: yes     Site/side marked: yes     Immediately prior to procedure, a time out was called: yes     Patient identity confirmed:  Verbally with patient Anesthesia:    Anesthesia method:  Local infiltration   Local anesthetic:  Lidocaine 2% WITH epi Laceration details:    Location:  Scalp   Scalp location:  Crown   Length (cm):  6   Depth (mm):  2 Exploration:    Limited defect created (wound extended): no     Contaminated: no   Treatment:    Area cleansed with:  Povidone-iodine   Amount of cleaning:  Standard   Irrigation solution:  Sterile saline   Irrigation volume:  235m   Irrigation method:  Syringe   Visualized foreign bodies/material removed: no     Debridement:  None   Undermining:  None   Scar revision: no   Skin repair:    Repair method:  Staples   Number of staples:  8 Approximation:    Approximation:  Close Repair type:    Repair type:  Simple Post-procedure details:    Dressing:  Open (no dressing)   Procedure completion:  Tolerated     Medications Ordered in ED Medications  lidocaine-EPINEPHrine (XYLOCAINE W/EPI) 2 %-1:200000 (PF) injection 10 mL (10 mLs Intradermal Given 11/28/21 1805)  Tdap (BOOSTRIX) injection 0.5 mL (0.5 mLs Intramuscular Given 11/28/21 1802)    ED Course/ Medical Decision Making/ A&P                           Medical Decision Making Risk Prescription drug management.   71year old male presents the ED after a fall that occurred prior to arrival.  Details in HPI above.  Vitals are without significant abnormality.  Patient has a large laceration on his scalp.  No evidence of basilar skull fracture.  Physical exam was otherwise benign and patient is feeling well.  Unfortunately, we do not have a CT scanner here in the emergency department today.  I discussed this with patient and recommended that [facility] to ensure no fracture or intracranial bleeding.  Patient does not wish to do this and thinks that he is "fine".  Overall, his physical exam and his vitals are normal aside from the laceration  to his scalp.  We discussed risks of not obtaining CT head and patient expresses understanding.  He wishes to repair the laceration and be discharged home with return precautions.  We discussed these in depth and patient is understanding.  Laceration was repaired with staples as described above.  Tdap updated.  Wound care discussed and patient advised on signs and symptoms of infection.  He again is understanding and patient was discharged home in stable condition.  Final Clinical Impression(s) / ED Diagnoses Final diagnoses:  Injury of head, initial encounter  Laceration of scalp, initial encounter    Rx / DC Orders ED Discharge Orders     None         Tonye Pearson, PA-C 11/28/21 1913    Tonye Pearson, [place] 11/28/21 1914    Fredia Sorrow, MD 12/12/21 1609

## 2021-11-28 NOTE — ED Triage Notes (Addendum)
Patient states he fell from ceiling to floor approx 8 ft. Patient c/o head laceration with bleeding controled. Patient also c/o lower back pain and right hip. Denies taking blood thinners

## 2021-12-10 ENCOUNTER — Ambulatory Visit: Payer: BC Managed Care – PPO | Admitting: Family Medicine

## 2021-12-10 ENCOUNTER — Encounter: Payer: Self-pay | Admitting: Family Medicine

## 2021-12-10 VITALS — BP 118/62 | HR 58 | Wt 225.0 lb

## 2021-12-10 DIAGNOSIS — R102 Pelvic and perineal pain: Secondary | ICD-10-CM

## 2021-12-10 DIAGNOSIS — R109 Unspecified abdominal pain: Secondary | ICD-10-CM | POA: Diagnosis not present

## 2021-12-10 LAB — POCT URINALYSIS DIP (MANUAL ENTRY)
Blood, UA: NEGATIVE
Glucose, UA: NEGATIVE mg/dL
Leukocytes, UA: NEGATIVE
Nitrite, UA: NEGATIVE
Protein Ur, POC: 100 mg/dL — AB
Spec Grav, UA: >=1.03 — AB (ref 1.010–1.025)
Urobilinogen, UA: 0.2 E.U./dL
pH, UA: 5 (ref 5.0–8.0)

## 2021-12-10 LAB — POCT UA - MICROSCOPIC ONLY: WBC, Ur, HPF, POC: NONE SEEN (ref 0–5)

## 2021-12-10 NOTE — Progress Notes (Signed)
ti   SUBJECTIVE:   CHIEF COMPLAINT / HPI:   Suprapubic abdominal pain: 71 year old male presents today for evaluation of suprapubic abdominal pain.  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  He landed hard on his right heel and thinks that he fell on his left hip, he also had an implement fall from above and hit him on the scalp causing a laceration on the vertex of his scalp.  He went to [facility] ED that day and his laceration was repaired, looks great today.  He had a chest x-ray and the ED physician recommended a CT head, however the CT machine at [facility] was broken that day.  ED physician offered [facility] transfer [facility] for [facility], he declined as he felt better and just wanted his laceration repaired.  Yesterday he woke up and had terrible suprapubic pain that made getting out of bed feel very difficult.  He had to stay home from work, which he almost never does.  This is not a patient who calls up sick, or seeks out care on his own usually.  Has not noticed any bruising, no blood in his urine, no nausea/vomiting/diarrhea.  Pain is constant ache, however improved today with ibuprofen.  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  He thinks when he may have landed he could have had a hammer in his tool belt that may have hit his suprapubic and genital region.  PERTINENT  PMH / PSH: CAD, angina pectoris, hyperlipidemia, hypertension, diverticulosis, BPH  OBJECTIVE:   BP 118/62   Pulse (!) 58   Wt 225 lb (102.1 kg)   SpO2 98%   BMI 32.28 kg/m   Nursing note and vitals reviewed GEN: Age-appropriate, WM, resting comfortably in chair, NAD, +1 obesity Cardiac: Regular rate and rhythm. Normal S1/S2. No murmurs, rubs, or gallops  appreciated. 2+ radial pulses. Lungs: Clear bilaterally to ascultation. No increased WOB, no accessory muscle usage. No w/r/r. Abdomen: Left, mildly tender in suprapubic region, nondistended, normoactive bowel sounds, no hepatosplenomegaly, no fluid wave, no bruising on abdomen Male genitalia: Penis: normal, no lesions and circumcised Testicles: normal, no masses and tender: left Scrotum: normal Epididymis: normal Skin of perineum: normal Rectal examination: anal sphincter normal and Guaiac negative Prostate examination: normal for age, normal consistency, non tender, no specific nodules, symmetrical, and enlarged: bilateral Neuro: AOx3  Ext: no edema Psych: Pleasant and appropriate  ASSESSMENT/PLAN:   Suprapubic pain, acute Concerning history that pain started 12 days after injury.  Physical exam is very reassuring, no pain on prostate exam, patient did not know he had testicular pain until I did the exam, no obvious bruising on the scrotum.  Rectal exam without bleeding and tenderness.  Given traumatic history could be slow retroperitoneal peritoneal bleed, possibly bladder, possibly colon.  As a result, will obtain CT A/P with contrast soon as possible, scheduled for Saturday.  Patient will follow-up next Wednesday to discuss results.  Patient given strict return precautions in case of intolerable pain overnight or this week and to go to the emergency room.  UA shows ketones and protein consistent with patient's working out in the sun today, no hematuria.     Gladys Damme, MD [facility]

## 2021-12-10 NOTE — Patient Instructions (Signed)
It was a pleasure to see you today!  We will get you scheduled for a CT scan. We will call you and let you know the appointment time for that Follow up next week Continue pain control with ibuprofen and tylenol Follow up next week    Be Well,  Dr. Chauncey Reading

## 2021-12-11 ENCOUNTER — Telehealth: Payer: Self-pay

## 2021-12-11 DIAGNOSIS — R102 Pelvic and perineal pain: Secondary | ICD-10-CM | POA: Insufficient documentation

## 2021-12-11 NOTE — Assessment & Plan Note (Signed)
Concerning history that pain started 12 days after injury.  Physical exam is very reassuring, no pain on prostate exam, patient did not know he had testicular pain until I did the exam, no obvious bruising on the scrotum.  Rectal exam without bleeding and tenderness.  Given traumatic history could be slow retroperitoneal peritoneal bleed, possibly bladder, possibly colon.  As a result, will obtain CT A/P with contrast soon as possible, scheduled for Saturday.  Patient will follow-up next Wednesday to discuss results.  Patient given strict return precautions in case of intolerable pain overnight or this week and to go to the emergency room.  UA shows ketones and protein consistent with patient's working out in the sun today, no hematuria.

## 2021-12-11 NOTE — Telephone Encounter (Signed)
CT Scan scheduled for 7/3 at 10:30am. NO PO after 6am that morning.   Patient has been contacted and advised to pick up contrast today.   Patient agreeable to plan.

## 2021-12-14 ENCOUNTER — Ambulatory Visit (HOSPITAL_COMMUNITY)
Admission: RE | Admit: 2021-12-14 | Discharge: 2021-12-14 | Disposition: A | Discharge status: Home / Self Care | Payer: BC Managed Care – PPO | Source: Ambulatory Visit | Attending: Family Medicine | Admitting: Family Medicine

## 2021-12-14 DIAGNOSIS — K3189 Other diseases of stomach and duodenum: Secondary | ICD-10-CM | POA: Diagnosis not present

## 2021-12-14 DIAGNOSIS — R102 Pelvic and perineal pain: Secondary | ICD-10-CM | POA: Diagnosis not present

## 2021-12-14 DIAGNOSIS — S3991XA Unspecified injury of abdomen, initial encounter: Secondary | ICD-10-CM | POA: Diagnosis not present

## 2021-12-14 DIAGNOSIS — K6389 Other specified diseases of intestine: Secondary | ICD-10-CM | POA: Diagnosis not present

## 2021-12-14 DIAGNOSIS — K5730 Diverticulosis of large intestine without perforation or abscess without bleeding: Secondary | ICD-10-CM | POA: Diagnosis not present

## 2021-12-14 MED ORDER — IOHEXOL 300 MG/ML  SOLN
100.0000 mL | Freq: Once | INTRAMUSCULAR | Status: AC | PRN
  Administered 2021-12-14: 100 mL via INTRAVENOUS

## 2021-12-15 NOTE — Progress Notes (Unsigned)
    SUBJECTIVE:   CHIEF COMPLAINT / HPI:   Suprapubic pain: Patient had a traumatic injury due to a 10 foot fall on 11/28/21. Last week he was seen for suprapubic pain and left testicle tenderness. He had a CT a/p that showed no concerning findings, no bleeding. Today he reports***  PERTINENT  PMH / PSH: ***  OBJECTIVE:   There were no vitals taken for this visit.  ***  ASSESSMENT/PLAN:   No problem-specific Assessment & Plan notes found for this encounter.     Gladys Damme, MD [place]

## 2021-12-16 ENCOUNTER — Ambulatory Visit: Payer: BC Managed Care – PPO | Admitting: Family Medicine

## 2021-12-16 ENCOUNTER — Encounter: Payer: Self-pay | Admitting: Family Medicine

## 2021-12-16 DIAGNOSIS — R102 Pelvic and perineal pain: Secondary | ICD-10-CM | POA: Diagnosis not present

## 2021-12-16 NOTE — Assessment & Plan Note (Signed)
Patient with improvement in pain though it is still present.  CT A/P with no signs of bleeding or other cause for patient's suprapubic pain.  Suspect musculoskeletal strain and groin after patient fell 10 feet and landed on a hammer from his belt.  Recommend continued light work, avoiding stretching or movements that cause pain.  I suspect that with tincture of time, this injury will heal completely.

## 2022-03-12 ENCOUNTER — Other Ambulatory Visit (HOSPITAL_BASED_OUTPATIENT_CLINIC_OR_DEPARTMENT_OTHER): Payer: Self-pay | Admitting: Family

## 2022-03-12 DIAGNOSIS — I10 Essential (primary) hypertension: Secondary | ICD-10-CM

## 2022-03-12 NOTE — Telephone Encounter (Signed)
Rx(s) sent to pharmacy electronically.  

## 2022-03-13 ENCOUNTER — Other Ambulatory Visit (HOSPITAL_BASED_OUTPATIENT_CLINIC_OR_DEPARTMENT_OTHER): Payer: Self-pay | Admitting: Family

## 2022-03-13 DIAGNOSIS — E785 Hyperlipidemia, unspecified: Secondary | ICD-10-CM

## 2022-03-13 DIAGNOSIS — I25118 Atherosclerotic heart disease of native coronary artery with other forms of angina pectoris: Secondary | ICD-10-CM

## 2022-03-15 NOTE — Telephone Encounter (Signed)
Rx(s) sent to pharmacy electronically.  

## 2022-04-02 NOTE — Progress Notes (Unsigned)
Cardiology Office Note:    Date:  04/02/2022   ID:  Johnny Wilcox [birth year], MRN [id number]  PCP:  Jacelyn Grip, [facility] Cardiologist:  Freada Bergeron, MD { Click to update primary MD,subspecialty MD or APP then REFRESH:1}    Referring MD: Jacelyn Grip, MD   No chief complaint on file. ***  History of Present Illness:    Johnny Wilcox is a 71 y.o. male with a hx of ***  Past Medical History:  Diagnosis Date   Allergy    CAD in native artery    a. abnormal nuc 2015 - s/p DES to RCA, minimal disease in prox LAD, EF 55-65%.   Cataract    removed from left eye   ED (erectile dysfunction)    Hyperlipidemia    Hypertension     Past Surgical History:  Procedure Laterality Date   CATARACT EXTRACTION Right    COLONOSCOPY     CORONARY STENT PLACEMENT     EYE SURGERY     child injury-repair.     KNEE ARTHROSCOPY Right 02/18/2017   Procedure: ARTHROSCOPY RIGHT KNEE WITH PARTIAL MEDIAL MENISECTOMY, MEDIAL CHONDROPLASTY AND PATELLA, AND MEDIAL PLICO EXCISION;  Surgeon: [name], MD;  Location: [facility];  Service: Orthopedics;  Laterality: Right;   LEFT HEART CATHETERIZATION WITH CORONARY ANGIOGRAM N/A 09/21/2013   Procedure: LEFT HEART CATHETERIZATION WITH CORONARY ANGIOGRAM;  Surgeon: [name], MD;  Location: [facility] CATH LAB;  Service: Cardiovascular;  Laterality: N/A;   MULTIPLE EXTRACTIONS WITH ALVEOLOPLASTY  12/06/2011   Procedure: MULTIPLE EXTRACION WITH ALVEOLOPLASTY;  Surgeon: [name], DDS;  Location: [facility];  Service: Oral Surgery;  Laterality: Bilateral;  Biopsy tissue right vestibule   PERCUTANEOUS CORONARY STENT INTERVENTION (PCI-S)  09/21/2013   Procedure: PERCUTANEOUS CORONARY STENT INTERVENTION (PCI-S);  Surgeon: [name], MD;  Location: [facility] CATH LAB;  Service: Cardiovascular;;   POLYPECTOMY     VASECTOMY      Current Medications: No outpatient medications have been marked as taking for the 04/05/22 encounter  (Appointment) with Freada Bergeron, MD.     Allergies:   Patient has no known allergies.   Social History   Socioeconomic History   Marital status: Married    Spouse name: Not on file   Number of children: Not on file   Years of education: Not on file   Highest education level: Not on file  Occupational History   Not on file  Tobacco Use   Smoking status: Never    Passive exposure: Never   Smokeless tobacco: Current    Types: Chew    Last attempt to quit: 09/06/2011  Vaping Use   Vaping Use: Never used  Substance and Sexual Activity   Alcohol use: Yes    Comment:  maybe 3 beers a month- rare    Drug use: No   Sexual activity: Yes  Other Topics Concern   Not on file  Social History Narrative   Not on file   Social Determinants of Health   Financial Resource Strain: Not on file  Food Insecurity: Not on file  Transportation Needs: Not on file  Physical Activity: Not on file  Stress: Not on file  Social Connections: Not on file     Family History: The patient's ***family history includes Colon polyps in his sister; Heart disease in his father and mother; Hypertension in his sister; Stroke in his mother. There is no history of Colon cancer,  Esophageal cancer, Rectal cancer, or Stomach cancer.  ROS:   Please see the history of present illness.    *** All other systems reviewed and are negative.  EKGs/Labs/Other Studies Reviewed:    The following studies were reviewed today: ***  EKG:  EKG is *** ordered today.  The ekg ordered today demonstrates ***  Recent Labs: No results found for requested labs within last 365 days.  Recent Lipid Panel    Component Value Date/Time   CHOL 113 03/12/2021 0848   TRIG 122 03/12/2021 0848   HDL 36 (L) 03/12/2021 0848   CHOLHDL 3.1 03/12/2021 0848   CHOLHDL 3 01/14/2015 0901   VLDL 21.6 01/14/2015 0901   LDLCALC 55 03/12/2021 0848     Risk Assessment/Calculations:   {Does this patient have ATRIAL  FIBRILLATION?:[phone]}  No BP recorded.  {Refresh Note OR Click here to enter BP  :1}***         Physical Exam:    VS:  There were no vitals taken for this visit.    Wt Readings from Last 3 Encounters:  12/16/21 228 lb 6 oz (103.6 kg)  12/10/21 225 lb (102.1 kg)  11/28/21 228 lb (103.4 kg)     GEN: *** Well nourished, well developed in no acute distress HEENT: Normal NECK: No JVD; No carotid bruits LYMPHATICS: No lymphadenopathy CARDIAC: ***RRR, no murmurs, rubs, gallops RESPIRATORY:  Clear to auscultation without rales, wheezing or rhonchi  ABDOMEN: Soft, non-tender, non-distended MUSCULOSKELETAL:  No edema; No deformity  SKIN: Warm and dry NEUROLOGIC:  Alert and oriented x 3 PSYCHIATRIC:  Normal affect   ASSESSMENT:    No diagnosis found. PLAN:    In order of problems listed above:  ***      {Are you ordering a CV Procedure (e.g. stress test, cath, DCCV, TEE, etc)?   Press F2        :[id number]}    Medication Adjustments/Labs and Tests Ordered: Current medicines are reviewed at length with the patient today.  Concerns regarding medicines are outlined above.  No orders of the defined types were placed in this encounter.  No orders of the defined types were placed in this encounter.   There are no Patient Instructions on file for this visit.   Signed, Freada Bergeron, MD  04/02/2022 8:43 AM    [place]

## 2022-04-05 ENCOUNTER — Ambulatory Visit: Payer: BC Managed Care – PPO | Attending: Cardiology | Admitting: Cardiology

## 2022-04-05 ENCOUNTER — Encounter: Payer: Self-pay | Admitting: Cardiology

## 2022-04-05 VITALS — BP 108/60 | HR 69 | Ht 69.0 in | Wt 231.4 lb

## 2022-04-05 DIAGNOSIS — I25118 Atherosclerotic heart disease of native coronary artery with other forms of angina pectoris: Secondary | ICD-10-CM | POA: Diagnosis not present

## 2022-04-05 DIAGNOSIS — E785 Hyperlipidemia, unspecified: Secondary | ICD-10-CM | POA: Diagnosis not present

## 2022-04-05 DIAGNOSIS — Z79899 Other long term (current) drug therapy: Secondary | ICD-10-CM

## 2022-04-05 DIAGNOSIS — I10 Essential (primary) hypertension: Secondary | ICD-10-CM | POA: Diagnosis not present

## 2022-04-05 MED ORDER — LISINOPRIL-HYDROCHLOROTHIAZIDE 20-25 MG PO TABS: 1.0000 | ORAL_TABLET | Freq: Every day | ORAL | 3 refills | Status: DC

## 2022-04-05 MED ORDER — ATORVASTATIN CALCIUM 80 MG PO TABS: 80.0000 mg | ORAL_TABLET | Freq: Every day | ORAL | 3 refills | Status: DC

## 2022-04-05 NOTE — Progress Notes (Signed)
Cardiology Office Note:    Date:  04/05/2022   ID:  Johnny Wilcox, Johnny Wilcox Johnny Wilcox, MRN [id number]  PCP:  Johnny Wilcox, Johnny Wilcox Providers Cardiologist:  Johnny Bergeron, MD {  Referring MD: Johnny Grip, MD    History of Present Illness:    Johnny Wilcox is a 71 y.o. male with a hx of CAD s/p DES to RCA, hypertension, hyperlipidemia, tobacco abuse and obesity who presents to clinic for follow-up.  Per review of the record, the patient underwent stress testing in 2015 which was abnormal. Subsequent cardiac catheterization with 50% proximal LAD, 90% RCA mid RCA s/p DES, LVEF 55 to 65%.  He was seen in 2018 doing overall well from cardiac perspective.  He was lost to follow-up until 10/31/2019 at which time he was still doing well and staying very active doing physical labor.   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  It was normal with good exercise capacity.  Was last seen in clinic by Johnny Wilcox in 02/2021 where he was doing well with no significant CV symptoms.   Today, he says he is doing pretty good. He denies any palpitations, chest pain, shortness of breath, or peripheral edema. No lightheadedness, headaches, syncope, orthopnea, or PND. Blood pressure is well controlled and at goal. Remains active without exertional symptoms. Suffers with join issues.  Past Medical History:  Diagnosis Date   Allergy    CAD in native artery    a. abnormal nuc 2015 - s/p DES to RCA, minimal disease in prox LAD, EF 55-65%.   Cataract    removed from left eye   ED (erectile dysfunction)    Hyperlipidemia    Hypertension     Past Surgical History:  Procedure Laterality Date   CATARACT EXTRACTION Right    COLONOSCOPY     CORONARY STENT PLACEMENT     EYE SURGERY     child injury-repair.     KNEE ARTHROSCOPY Right 02/18/2017   Procedure: ARTHROSCOPY RIGHT KNEE WITH PARTIAL MEDIAL MENISECTOMY, MEDIAL CHONDROPLASTY AND PATELLA, AND MEDIAL  PLICO EXCISION;  Surgeon: [name], MD;  Location: [facility];  Service: Orthopedics;  Laterality: Right;   LEFT HEART CATHETERIZATION WITH CORONARY ANGIOGRAM N/A 09/21/2013   Procedure: LEFT HEART CATHETERIZATION WITH CORONARY ANGIOGRAM;  Surgeon: [name], MD;  Location: [facility] CATH LAB;  Service: Cardiovascular;  Laterality: N/A;   MULTIPLE EXTRACTIONS WITH ALVEOLOPLASTY  12/06/2011   Procedure: MULTIPLE EXTRACION WITH ALVEOLOPLASTY;  Surgeon: [name], DDS;  Location: [place];  Service: Oral Surgery;  Laterality: Bilateral;  Biopsy tissue right vestibule   PERCUTANEOUS CORONARY STENT INTERVENTION (PCI-S)  09/21/2013   Procedure: PERCUTANEOUS CORONARY STENT INTERVENTION (PCI-S);  Surgeon: [name], MD;  Location: [facility] CATH LAB;  Service: Cardiovascular;;   POLYPECTOMY     VASECTOMY      Current Medications: No outpatient medications have been marked as taking for the 04/05/22 encounter (Office Visit) with Johnny Bergeron, MD.     Allergies:   Patient has no known allergies.   Social History   Socioeconomic History   Marital status: Married    Spouse name: Not on file   Number of children: Not on file   Years of education: Not on file   Highest education level: Not on file  Occupational History   Not on file  Tobacco Use   Smoking status: Never    Passive exposure: Never   Smokeless  tobacco: Current    Types: Chew    Last attempt to quit: 09/06/2011  Vaping Use   Vaping Use: Never used  Substance and Sexual Activity   Alcohol use: Yes    Comment:  maybe 3 beers a month- rare    Drug use: No   Sexual activity: Yes  Other Topics Concern   Not on file  Social History Narrative   Not on file   Social Determinants of Health   Financial Resource Strain: Not on file  Food Insecurity: Not on file  Transportation Needs: Not on file  Physical Activity: Not on file  Stress: Not on file  Social Connections: Not on file     Family History: The patient's family  history includes Colon polyps in his sister; Heart disease in his father and mother; Hypertension in his sister; Stroke in his mother. There is no history of Colon cancer, Esophageal cancer, Rectal cancer, or Stomach cancer.  ROS:   Please see the history of present illness.    (+) Joint pain  All other systems reviewed and are negative.  EKGs/Labs/Other Studies Reviewed:    The following studies were reviewed today:  ETT 01/30/2020 There was no ST segment deviation noted during stress.   Exercise time: 8 minutes 9 seconds Workload: 10.1 METS, good exercise capacity Test stopped due to: Fatigue and shortness of breath BP response: Elevated blood pressure response without true hypertensive response to exercise.  Peak blood pressure 206/76 mmHg HR response: Normal Rhythm: Sinus rhythm   Upsloping ST segment changes near peak stress.  Occasional horizontal ST segment with beat-to-beat variability, without evidence of ischemia.    Normal stress ECG with good exercise capacity.  ____________________________________________________________   Johnny Wilcox 09/2013 Cardiac Catheterization Procedure Note   Name: Johnny Wilcox MRN: [id number] DOB: [birth year]   Procedure: Left Heart Cath, Selective Coronary Angiography, LV angiography, PTCA and stenting of the mid RCA   Indication:71 yo WM with recent onset of angina class IV. Myoview showed an inferior wall perfusion abnormality.   Procedural Details:  The right wrist was prepped, draped, and anesthetized with 1% lidocaine. Using the modified Seldinger technique, a 6 French slender sheath was introduced into the right radial artery. 3 mg of verapamil was administered through the sheath, weight-based unfractionated heparin was administered intravenously. Standard Judkins catheters were used for selective coronary angiography and left ventriculography. Catheter exchanges were performed over an exchange length guidewire.   PROCEDURAL  FINDINGS Hemodynamics: AO 108/57 mean 79 mm Hg LV 105/16 mm Hg              Coronary angiography: Coronary dominance: right   Left mainstem: Normal   Left anterior descending (LAD): There is smooth narrowing of the proximal LAD up to 30%. The first diagonal is a large branch and is normal.   Left circumflex (LCx): Normal.   Right coronary artery (RCA): Inferior takeoff. There is a 90% stenosis in the mid RCA at the site of takeoff of the RV marginal branch.   Left ventriculography: Left ventricular systolic function is normal, LVEF is estimated at 55-65%, there is no significant mitral regurgitation    PCI Note:  Following the diagnostic procedure, the decision was made to proceed with PCI.  Weight-based heparin was given for anticoagulation. Brilinta 180 mg was given orally. Once a therapeutic ACT was achieved, a 6 [place] MP A1 guide catheter was inserted.  A prowater coronary guidewire was used to cross the lesion.  The lesion was predilated with  a 2.5 mm balloon.  The lesion was then stented with a 4.0 x 23 mm Alpine stent.  The stent was postdilated with a 4.0 noncompliant balloon.  Following PCI, there was 0% residual stenosis and TIMI-3 flow. Final angiography confirmed an excellent result. The patient tolerated the procedure well. There were no immediate procedural complications. A TR band was used for radial hemostasis. The patient was transferred to the post catheterization recovery area for further monitoring.   PCI Data: Vessel - RCA/Segment - mid Percent Stenosis (pre)  90% TIMI-flow 3 Stent 4.0 x 23 mm Alpine stent Percent Stenosis (post) 0% TIMI-flow (post) 3   Final Conclusions:   1. Single vessel obstructive CAD 2. Normal LV function  3. Successful stenting of the mid RCA with a DES.   Recommendations:  Continue dual antiplatelet therapy for one year. Start statin for risk factor modification. Anticipate DC in am.   Ander Slade [facility] 09/21/2013, 10:33 AM      EKG:  EKG is personally reviewed. 04/05/22: Sinus rhythm. Rate 69 bpm.   Recent Labs: No results found for requested labs within last 365 days.  Recent Lipid Panel    Component Value Date/Time   CHOL 113 03/12/2021 0848   TRIG 122 03/12/2021 0848   HDL 36 (L) 03/12/2021 0848   CHOLHDL 3.1 03/12/2021 0848   CHOLHDL 3 01/14/2015 0901   VLDL 21.6 01/14/2015 0901   LDLCALC 55 03/12/2021 0848     Risk Assessment/Calculations:                Physical Exam:    VS:  BP 108/60 (BP Location: Left Arm, Patient Position: Sitting, Cuff Size: Normal)   Pulse 69   Ht '5\' 9"'  (1.753 m)   Wt 231 lb 6.4 oz (105 kg)   SpO2 95%   BMI 34.17 kg/m     Wt Readings from Last 3 Encounters:  04/05/22 231 lb 6.4 oz (105 kg)  12/16/21 228 lb 6 oz (103.6 kg)  12/10/21 225 lb (102.1 kg)     GEN:  Well nourished, well developed in no acute distress HEENT: Normal NECK: No JVD; No carotid bruits CARDIAC: RRR, no murmurs, rubs, gallops RESPIRATORY:  Clear to auscultation without rales, wheezing or rhonchi  ABDOMEN: Soft, non-tender, non-distended MUSCULOSKELETAL:  No edema; No deformity  SKIN: Warm and dry NEUROLOGIC:  Alert and oriented x 3 PSYCHIATRIC:  Normal affect   ASSESSMENT:    1. Medication management   2. Coronary artery disease of native artery of native heart with stable angina pectoris ([place])   3. Hyperlipidemia LDL goal <70   4. Essential hypertension    PLAN:    In order of problems listed above:  #CAD s/p DES to RCA: Had abnormal nuc in 2015 prompting cath where he underwent DES to RCA. EF 55-60%. Currently doing well. -Continue ASA 39m daily -Continue lipitor 837mdaily  #HTN: Well controlled and at goal <130/90. -Continue lisinopril-HCTZ 20-2560maily -Check CMET for monitoring  #HLD: -Continue lipitor 47m30mily -Repeat fasting lipids     Follow up: 1 year.  Medication Adjustments/Labs and Tests Ordered: Current medicines are reviewed at length with  the patient today.  Concerns regarding medicines are outlined above.  Orders Placed This Encounter  Procedures   Lipid Profile   HgB A1c   Comp Met (CMET)   EKG 12-Lead   Meds ordered this encounter  Medications   atorvastatin (LIPITOR) 80 MG tablet    Sig: Take 1 tablet (80  mg total) by mouth daily.    Dispense:  90 tablet    Refill:  3   lisinopril-hydrochlorothiazide (ZESTORETIC) 20-25 MG tablet    Sig: Take 1 tablet by mouth daily.    Dispense:  90 tablet    Refill:  3    Patient Instructions  Medication Instructions:   Your physician recommends that you continue on your current medications as directed. Please refer to the Current Medication list given to you today.  *If you need a refill on your cardiac medications before your next appointment, please call your pharmacy*   Lab Work:  SOMETIME THIS WEEK IN THE OFFICE--CHECK LIPIDS, HEMOGLOBIN A1C, AND CMET AT THAT TIME--PLEASE COME FASTING TO THIS LAB APPOINTMENT  If you have labs (blood work) drawn today and your tests are completely normal, you will receive your results only by: [place] (if you have MyChart) OR A paper copy in the mail If you have any lab test that is abnormal or we need to change your treatment, we will call you to review the results.    Follow-Up: At [facility], you and your health needs are our priority.  As part of our continuing mission to provide you with exceptional heart care, we have created designated Provider Care Teams.  These Care Teams include your primary Cardiologist (physician) and Advanced Practice Providers (APPs -  Physician Assistants and Nurse Practitioners) who all work together to provide you with the care you need, when you need it.  We recommend signing up for the patient portal called "MyChart".  Sign up information is provided on this After Visit Summary.  MyChart is used to connect with patients for Virtual Visits (Telemedicine).  Patients are able to  view lab/test results, encounter notes, upcoming appointments, etc.  Non-urgent messages can be sent to your provider as well.   To learn more about what you can do with MyChart, go to [email or web address].    Your next appointment:   1 year(s)  The format for your next appointment:   In Person  Provider:   Freada Bergeron, MD      Important Information About Sugar          I,Breanna Adamick,acting as a scribe for Johnny Bergeron, MD.,have documented all relevant documentation on the behalf of Johnny Bergeron, MD,as directed by  Johnny Bergeron, MD while in the presence of Johnny Bergeron, MD.  I, Johnny Bergeron, MD, have reviewed all documentation for this visit. The documentation on 04/05/22 for the exam, diagnosis, procedures, and orders are all accurate and complete.   Signed, Johnny Bergeron, MD  04/05/2022 2:57 PM    [place]

## 2022-04-05 NOTE — Patient Instructions (Signed)
Medication Instructions:   Your physician recommends that you continue on your current medications as directed. Please refer to the Current Medication list given to you today.  *If you need a refill on your cardiac medications before your next appointment, please call your pharmacy*   Lab Work:  SOMETIME THIS WEEK IN THE OFFICE--CHECK LIPIDS, HEMOGLOBIN A1C, AND CMET AT THAT TIME--PLEASE COME FASTING TO THIS LAB APPOINTMENT  If you have labs (blood work) drawn today and your tests are completely normal, you will receive your results only by: [place] (if you have MyChart) OR A paper copy in the mail If you have any lab test that is abnormal or we need to change your treatment, we will call you to review the results.    Follow-Up: At [facility], you and your health needs are our priority.  As part of our continuing mission to provide you with exceptional heart care, we have created designated Provider Care Teams.  These Care Teams include your primary Cardiologist (physician) and Advanced Practice Providers (APPs -  Physician Assistants and Nurse Practitioners) who all work together to provide you with the care you need, when you need it.  We recommend signing up for the patient portal called "MyChart".  Sign up information is provided on this After Visit Summary.  MyChart is used to connect with patients for Virtual Visits (Telemedicine).  Patients are able to view lab/test results, encounter notes, upcoming appointments, etc.  Non-urgent messages can be sent to your provider as well.   To learn more about what you can do with MyChart, go to [email or web address].    Your next appointment:   1 year(s)  The format for your next appointment:   In Person  Provider:   Freada Bergeron, MD      Important Information About Sugar

## 2022-04-09 ENCOUNTER — Ambulatory Visit: Payer: BC Managed Care – PPO | Attending: Cardiology

## 2022-04-09 DIAGNOSIS — I10 Essential (primary) hypertension: Secondary | ICD-10-CM | POA: Diagnosis not present

## 2022-04-09 DIAGNOSIS — E785 Hyperlipidemia, unspecified: Secondary | ICD-10-CM | POA: Diagnosis not present

## 2022-04-09 DIAGNOSIS — Z79899 Other long term (current) drug therapy: Secondary | ICD-10-CM | POA: Diagnosis not present

## 2022-04-09 DIAGNOSIS — I25118 Atherosclerotic heart disease of native coronary artery with other forms of angina pectoris: Secondary | ICD-10-CM | POA: Diagnosis not present

## 2022-04-10 LAB — LIPID PANEL
Chol/HDL Ratio: 2.7 ratio (ref 0.0–5.0)
Cholesterol, Total: 93 mg/dL — ABNORMAL LOW (ref 100–199)
HDL: 35 mg/dL — ABNORMAL LOW (ref >39)
LDL Chol Calc (NIH): 35 mg/dL (ref 0–99)
Triglycerides: 129 mg/dL (ref 0–149)
VLDL Cholesterol Cal: 23 mg/dL (ref 5–40)

## 2022-04-10 LAB — HEMOGLOBIN A1C
Est. average glucose Bld gHb Est-mCnc: 117 mg/dL
Hgb A1c MFr Bld: 5.7 % — ABNORMAL HIGH (ref 4.8–5.6)

## 2022-04-10 LAB — COMPREHENSIVE METABOLIC PANEL
ALT: 16 IU/L (ref 0–44)
AST: 23 IU/L (ref 0–40)
Albumin/Globulin Ratio: 1.7 (ref 1.2–2.2)
Albumin: 4.3 g/dL (ref 3.8–4.8)
Alkaline Phosphatase: 80 IU/L (ref 44–121)
BUN/Creatinine Ratio: 18 (ref 10–24)
BUN: 18 mg/dL (ref 8–27)
Bilirubin Total: 0.4 mg/dL (ref 0.0–1.2)
CO2: 25 mmol/L (ref 20–29)
Calcium: 9.3 mg/dL (ref 8.6–10.2)
Chloride: 101 mmol/L (ref 96–106)
Creatinine, Ser: 0.98 mg/dL (ref 0.76–1.27)
Globulin, Total: 2.5 g/dL (ref 1.5–4.5)
Glucose: 86 mg/dL (ref 70–99)
Potassium: 4 mmol/L (ref 3.5–5.2)
Sodium: 140 mmol/L (ref 134–144)
Total Protein: 6.8 g/dL (ref 6.0–8.5)
eGFR: 82 mL/min/{1.73_m2} (ref >59)

## 2022-12-08 ENCOUNTER — Encounter: Payer: Self-pay | Admitting: Nurse Practitioner

## 2022-12-08 ENCOUNTER — Ambulatory Visit: Payer: PRIVATE HEALTH INSURANCE | Admitting: Nurse Practitioner

## 2022-12-08 VITALS — BP 130/72 | HR 53 | Temp 97.9°F | Ht 70.0 in | Wt 229.0 lb

## 2022-12-08 DIAGNOSIS — M79672 Pain in left foot: Secondary | ICD-10-CM

## 2022-12-08 DIAGNOSIS — M79671 Pain in right foot: Secondary | ICD-10-CM

## 2022-12-08 DIAGNOSIS — N401 Enlarged prostate with lower urinary tract symptoms: Secondary | ICD-10-CM | POA: Diagnosis not present

## 2022-12-08 DIAGNOSIS — R350 Frequency of micturition: Secondary | ICD-10-CM | POA: Diagnosis not present

## 2022-12-08 DIAGNOSIS — Z1329 Encounter for screening for other suspected endocrine disorder: Secondary | ICD-10-CM | POA: Diagnosis not present

## 2022-12-08 DIAGNOSIS — Z131 Encounter for screening for diabetes mellitus: Secondary | ICD-10-CM | POA: Diagnosis not present

## 2022-12-08 DIAGNOSIS — G8929 Other chronic pain: Secondary | ICD-10-CM

## 2022-12-08 DIAGNOSIS — M25551 Pain in right hip: Secondary | ICD-10-CM

## 2022-12-08 DIAGNOSIS — I10 Essential (primary) hypertension: Secondary | ICD-10-CM

## 2022-12-08 DIAGNOSIS — I25118 Atherosclerotic heart disease of native coronary artery with other forms of angina pectoris: Secondary | ICD-10-CM

## 2022-12-08 DIAGNOSIS — E782 Mixed hyperlipidemia: Secondary | ICD-10-CM | POA: Diagnosis not present

## 2022-12-08 NOTE — Progress Notes (Signed)
Bethanie Dicker, NP-C Phone: [phone]  Johnny Wilcox is a 72 y.o. male who presents today to establish care. He is doing well on all of his medications. He is followed by Cardiology.   HYPERTENSION Disease Monitoring Home BP Monitoring- 120s/60s Chest pain- No    Dyspnea- No Medications Compliance-  Lisinopril - HCTZ. Lightheadedness-  No  Edema- No BMET    Component Value Date/Time   NA 137 12/08/2022 1614   NA 140 04/09/2022 1559   K 3.7 12/08/2022 1614   CL 101 12/08/2022 1614   CO2 30 12/08/2022 1614   GLUCOSE 93 12/08/2022 1614   BUN 28 (H) 12/08/2022 1614   BUN 18 04/09/2022 1559   CREATININE 1.29 12/08/2022 1614   CREATININE 0.94 08/05/2013 1221   CALCIUM 9.3 12/08/2022 1614   GFRNONAA 63 11/28/2019 0904   GFRAA 73 11/28/2019 0904   HYPERLIPIDEMIA Symptoms Chest pain on exertion:  No   Leg claudication:   No Medications: Compliance- Lipitor Right upper quadrant pain- No  Muscle aches- No Lipid Panel     Component Value Date/Time   CHOL 98 12/08/2022 1614   CHOL 93 (L) 04/09/2022 1559   TRIG 246.0 (H) 12/08/2022 1614   HDL 27.00 (L) 12/08/2022 1614   HDL 35 (L) 04/09/2022 1559   CHOLHDL 4 12/08/2022 1614   VLDL 49.2 (H) 12/08/2022 1614   LDLCALC 35 04/09/2022 1559   LDLDIRECT 37.0 12/08/2022 1614   LABVLDL 23 04/09/2022 1559   Heel Pain- Patient with bilateral heel pain for 3 months. His left heel is worse than his right. He denies any recent injury. He does have occasional pain with weight bearing. The pain waxes and wanes. He has pain with moving his toes. He has tried home exercises and icing his feet.   Hip Pain- Patient with chronic right hip pain for several years that has been gradually worsening. The pain is worse after sitting for a long period of time and with going down the stairs. He denies any injuries.   Active Ambulatory Problems    Diagnosis Date Noted   HTN (hypertension) 03/22/2012   BMI 34.0-34.9,adult 03/22/2012   BPH (benign prostatic  hyperplasia)-mild July 2003 03/22/2012   Hyperlipidemia-Mild 03/22/2012   ED (erectile dysfunction) 04/04/2012   Angina pectoris (HCC) 09/21/2013   CAD (coronary artery disease) 10/04/2013   Hearing loss at 3000 bilat 03/2014 03/20/2014   CTS (carpal tunnel syndrome)-bilat 2015 03/20/2014   Full dentures 03/20/2014   Chondromalacia, right knee 02/18/2017   Annual physical exam 03/21/2020   Suprapubic pain, acute 12/11/2021   Chronic right hip pain 12/08/2022   Heel pain, bilateral 12/08/2022   Acute swimmer's ear of left side 02/08/2018   Bilateral impacted cerumen 06/13/2017   Chewing tobacco use 06/13/2017   Conductive hearing loss of left ear with unrestricted hearing of right ear 06/13/2017   Resolved Ambulatory Problems    Diagnosis Date Noted   Diverticulosis Colonoscopy 2003 03/22/2012   Chest pain 08/22/2013   Acute medial meniscus tear of right knee 02/18/2017   Past Medical History:  Diagnosis Date   Allergy    CAD in native artery    Cataract    Hypertension     Family History  Problem Relation Age of Onset   Heart disease Father    Heart disease Mother    Stroke Mother    Hypertension Sister    Colon polyps Sister    Colon cancer Neg Hx    Esophageal cancer Neg Hx  Rectal cancer Neg Hx    Stomach cancer Neg Hx     Social History   Socioeconomic History   Marital status: Married    Spouse name: Not on file   Number of children: Not on file   Years of education: Not on file   Highest education level: Not on file  Occupational History   Not on file  Tobacco Use   Smoking status: Never    Passive exposure: Never   Smokeless tobacco: Current    Types: Chew    Last attempt to quit: 09/06/2011  Vaping Use   Vaping Use: Never used  Substance and Sexual Activity   Alcohol use: Yes    Comment:  maybe 3 beers a month- rare    Drug use: No   Sexual activity: Yes  Other Topics Concern   Not on file  Social History Narrative   Not on file    Social Determinants of Health   Financial Resource Strain: Not on file  Food Insecurity: Not on file  Transportation Needs: Not on file  Physical Activity: Not on file  Stress: Not on file  Social Connections: Not on file  Intimate Partner Violence: Not on file    ROS  General:  Negative for unexplained weight loss, fever Skin: Negative for new or changing mole, sore that won't heal HEENT: Negative for trouble hearing, trouble seeing, ringing in ears, mouth sores, hoarseness, change in voice, dysphagia. CV:  Negative for chest pain, dyspnea, edema, palpitations Resp: Negative for cough, dyspnea, hemoptysis GI: Negative for nausea, vomiting, diarrhea, constipation, abdominal pain, melena, hematochezia. GU: Negative for dysuria, incontinence, urinary hesitance, hematuria, vaginal or penile discharge, polyuria, sexual difficulty, lumps in testicle or breasts MSK: Negative for muscle cramps or joint swelling Neuro: Negative for headaches, weakness, numbness, dizziness, passing out/fainting Psych: Negative for depression, anxiety, memory problems  Objective  Physical Exam Vitals:   12/08/22 1538  BP: 130/72  Pulse: (!) 53  Temp: 97.9 F (36.6 C)  SpO2: 97%    BP Readings from Last 3 Encounters:  12/08/22 130/72  04/05/22 108/60  12/16/21 119/74   Wt Readings from Last 3 Encounters:  12/08/22 229 lb (103.9 kg)  04/05/22 231 lb 6.4 oz (105 kg)  12/16/21 228 lb 6 oz (103.6 kg)    Physical Exam Constitutional:      General: He is not in acute distress.    Appearance: Normal appearance.  HENT:     Head: Normocephalic.  Cardiovascular:     Rate and Rhythm: Normal rate and regular rhythm.     Heart sounds: Normal heart sounds.  Pulmonary:     Effort: Pulmonary effort is normal.     Breath sounds: Normal breath sounds.  Musculoskeletal:     Right hip: Normal. No tenderness. Normal range of motion. Normal strength.     Left hip: Normal.     Right foot: Normal range  of motion. No deformity.     Left foot: Normal range of motion. No deformity.  Feet:     Right foot:     Skin integrity: Skin integrity normal.     Left foot:     Skin integrity: Skin integrity normal.  Skin:    General: Skin is warm and dry.  Neurological:     General: No focal deficit present.     Mental Status: He is alert.  Psychiatric:        Mood and Affect: Mood normal.  Behavior: Behavior normal.    Assessment/Plan:   Chronic right hip pain Assessment & Plan: Chronic issue. Pain has been gradually worsening. Will refer to Orthopedics for further evaluation.   Orders: -     Ambulatory referral to Orthopedics  Heel pain, bilateral Assessment & Plan: Consistent with plantar fasciitis. Information on exercises and treatments patient can do at home provided. Will refer to Podiatry for further evaluation.   Orders: -     Ambulatory referral to Podiatry  Primary hypertension Assessment & Plan: Chronic. Stable on Lisinopril-Hydrochlorothiazide 20-25 mg daily. Continue. Lab work as outlined.   Orders: -     Comprehensive metabolic panel -     CBC with Differential/Platelet  Mixed hyperlipidemia Assessment & Plan: Chronic. Stable on Lipitor 80 mg daily. Continue. Will check lipids.   Orders: -     Lipid panel -     LDL cholesterol, direct  Coronary artery disease involving native coronary artery of native heart with other form of angina pectoris [facility]) Assessment & Plan: Chronic. Continue ASA and Lipitor daily. Follow up with Cardiology as scheduled.    Benign prostatic hyperplasia with urinary frequency Assessment & Plan: Chronic. Will check PSA today. He does not get up frequently at night to use the restroom. Denies difficulty with urination. He is not currently on any medications. Will continue to monitor.   Orders: -     PSA  Thyroid disorder screen -     TSH  Diabetes mellitus screening -     Hemoglobin A1c   Return in about 6 months  (around 06/09/2023) for Follow up.   Bethanie Dicker, NP-C [facility] [employer]

## 2022-12-09 LAB — COMPREHENSIVE METABOLIC PANEL
ALT: 16 U/L (ref 0–53)
AST: 19 U/L (ref 0–37)
Albumin: 4.5 g/dL (ref 3.5–5.2)
Alkaline Phosphatase: 67 U/L (ref 39–117)
BUN: 28 mg/dL — ABNORMAL HIGH (ref 6–23)
CO2: 30 mEq/L (ref 19–32)
Calcium: 9.3 mg/dL (ref 8.4–10.5)
Chloride: 101 mEq/L (ref 96–112)
Creatinine, Ser: 1.29 mg/dL (ref 0.40–1.50)
GFR: 55.12 mL/min — ABNORMAL LOW (ref >60.00)
Glucose, Bld: 93 mg/dL (ref 70–99)
Potassium: 3.7 mEq/L (ref 3.5–5.1)
Sodium: 137 mEq/L (ref 135–145)
Total Bilirubin: 0.6 mg/dL (ref 0.2–1.2)
Total Protein: 7 g/dL (ref 6.0–8.3)

## 2022-12-09 LAB — PSA: PSA: 1.16 ng/mL (ref 0.10–4.00)

## 2022-12-09 LAB — LIPID PANEL
Cholesterol: 98 mg/dL (ref 0–200)
HDL: 27 mg/dL — ABNORMAL LOW (ref >39.00)
NonHDL: 70.7
Total CHOL/HDL Ratio: 4
Triglycerides: 246 mg/dL — ABNORMAL HIGH (ref 0.0–149.0)
VLDL: 49.2 mg/dL — ABNORMAL HIGH (ref 0.0–40.0)

## 2022-12-09 LAB — CBC WITH DIFFERENTIAL/PLATELET
Basophils Absolute: 0.1 10*3/uL (ref 0.0–0.1)
Basophils Relative: 1.1 % (ref 0.0–3.0)
Eosinophils Absolute: 0.2 10*3/uL (ref 0.0–0.7)
Eosinophils Relative: 2.8 % (ref 0.0–5.0)
HCT: 39.2 % (ref 39.0–52.0)
Hemoglobin: 13.2 g/dL (ref 13.0–17.0)
Lymphocytes Relative: 23.6 % (ref 12.0–46.0)
Lymphs Abs: 1.5 10*3/uL (ref 0.7–4.0)
MCHC: 33.7 g/dL (ref 30.0–36.0)
MCV: 96 fl (ref 78.0–100.0)
Monocytes Absolute: 0.7 10*3/uL (ref 0.1–1.0)
Monocytes Relative: 10.5 % (ref 3.0–12.0)
Neutro Abs: 4 10*3/uL (ref 1.4–7.7)
Neutrophils Relative %: 62 % (ref 43.0–77.0)
Platelets: 232 10*3/uL (ref 150.0–400.0)
RBC: 4.09 Mil/uL — ABNORMAL LOW (ref 4.22–5.81)
RDW: 13.7 % (ref 11.5–15.5)
WBC: 6.4 10*3/uL (ref 4.0–10.5)

## 2022-12-09 LAB — HEMOGLOBIN A1C: Hgb A1c MFr Bld: 5.8 % (ref 4.6–6.5)

## 2022-12-09 LAB — LDL CHOLESTEROL, DIRECT: Direct LDL: 37 mg/dL

## 2022-12-09 LAB — TSH: TSH: 1.25 u[IU]/mL (ref 0.35–5.50)

## 2022-12-15 ENCOUNTER — Ambulatory Visit (INDEPENDENT_AMBULATORY_CARE_PROVIDER_SITE_OTHER): Payer: PRIVATE HEALTH INSURANCE | Admitting: Podiatry

## 2022-12-15 ENCOUNTER — Encounter: Payer: Self-pay | Admitting: Podiatry

## 2022-12-15 ENCOUNTER — Encounter: Payer: Self-pay | Admitting: Nurse Practitioner

## 2022-12-15 ENCOUNTER — Ambulatory Visit (INDEPENDENT_AMBULATORY_CARE_PROVIDER_SITE_OTHER): Payer: PRIVATE HEALTH INSURANCE

## 2022-12-15 ENCOUNTER — Other Ambulatory Visit: Payer: Self-pay | Admitting: Podiatry

## 2022-12-15 DIAGNOSIS — M19071 Primary osteoarthritis, right ankle and foot: Secondary | ICD-10-CM

## 2022-12-15 DIAGNOSIS — M722 Plantar fascial fibromatosis: Secondary | ICD-10-CM

## 2022-12-15 MED ORDER — TRIAMCINOLONE ACETONIDE 40 MG/ML IJ SUSP
20.0000 mg | Freq: Once | INTRAMUSCULAR | Status: AC
Start: 2022-12-15 — End: 2022-12-15
  Administered 2022-12-15: 20 mg

## 2022-12-15 MED ORDER — MELOXICAM 15 MG PO TABS: 15.0000 mg | ORAL_TABLET | Freq: Every day | ORAL | 3 refills | Status: DC

## 2022-12-15 MED ORDER — METHYLPREDNISOLONE 4 MG PO TBPK: ORAL_TABLET | ORAL | 0 refills | Status: DC

## 2022-12-15 NOTE — Assessment & Plan Note (Signed)
Consistent with plantar fasciitis. Information on exercises and treatments patient can do at home provided. Will refer to Podiatry for further evaluation.

## 2022-12-15 NOTE — Progress Notes (Signed)
Subjective:  Patient ID: Johnny Wilcox, male    DOB: 1951/01/28,  MRN: [id number] HPI Chief Complaint  Patient presents with   Foot Pain    Plantar heel left - aching x 2-3 months, right foot hurts all over the foot, which has been ongoing for years, tried massaging   New Patient (Initial Visit)    72 y.o. male presents with the above complaint.   ROS: Denies fever chills nausea vomit muscle aches pains calf pain back pain chest pain shortness of breath.  Past Medical History:  Diagnosis Date   Allergy    CAD in native artery    a. abnormal nuc 2015 - s/p DES to RCA, minimal disease in prox LAD, EF 55-65%.   Cataract    removed from left eye   ED (erectile dysfunction)    Hyperlipidemia    Hypertension    Past Surgical History:  Procedure Laterality Date   CATARACT EXTRACTION Right    COLONOSCOPY     CORONARY STENT PLACEMENT     EYE SURGERY     child injury-repair.     KNEE ARTHROSCOPY Right 02/18/2017   Procedure: ARTHROSCOPY RIGHT KNEE WITH PARTIAL MEDIAL MENISECTOMY, MEDIAL CHONDROPLASTY AND PATELLA, AND MEDIAL PLICO EXCISION;  Surgeon: Jodi Geralds, MD;  Location: [facility];  Service: Orthopedics;  Laterality: Right;   LEFT HEART CATHETERIZATION WITH CORONARY ANGIOGRAM N/A 09/21/2013   Procedure: LEFT HEART CATHETERIZATION WITH CORONARY ANGIOGRAM;  Surgeon: Peter M Swaziland, MD;  Location: [facility] CATH LAB;  Service: Cardiovascular;  Laterality: N/A;   MULTIPLE EXTRACTIONS WITH ALVEOLOPLASTY  12/06/2011   Procedure: MULTIPLE EXTRACION WITH ALVEOLOPLASTY;  Surgeon: Georgia Lopes, DDS;  Location: [facility] OR;  Service: Oral Surgery;  Laterality: Bilateral;  Biopsy tissue right vestibule   PERCUTANEOUS CORONARY STENT INTERVENTION (PCI-S)  09/21/2013   Procedure: PERCUTANEOUS CORONARY STENT INTERVENTION (PCI-S);  Surgeon: Peter M Swaziland, MD;  Location: [facility] CATH LAB;  Service: Cardiovascular;;   POLYPECTOMY     VASECTOMY      Current Outpatient Medications:    meloxicam (MOBIC) 15 MG tablet,  Take 1 tablet (15 mg total) by mouth daily., Disp: 30 tablet, Rfl: 3   methylPREDNISolone (MEDROL DOSEPAK) 4 MG TBPK tablet, 6 day dose pack - take as directed, Disp: 21 tablet, Rfl: 0   aspirin EC 81 MG tablet, Take 81 mg by mouth daily., Disp: , Rfl:    atorvastatin (LIPITOR) 80 MG tablet, Take 1 tablet (80 mg total) by mouth daily., Disp: 90 tablet, Rfl: 3   lisinopril-hydrochlorothiazide (ZESTORETIC) 20-25 MG tablet, Take 1 tablet by mouth daily., Disp: 90 tablet, Rfl: 3  No Known Allergies Review of Systems Objective:  There were no vitals filed for this visit.  General: Well developed, nourished, in no acute distress, alert and oriented x3   Dermatological: Skin is warm, dry and supple bilateral. Nails x 10 are well maintained; remaining integument appears unremarkable at this time. There are no open sores, no preulcerative lesions, no rash or signs of infection present.  Vascular: Dorsalis Pedis artery and Posterior Tibial artery pedal pulses are 2/4 bilateral with immedate capillary fill time. Pedal hair growth present. No varicosities and no lower extremity edema present bilateral.   Neruologic: Grossly intact via light touch bilateral. Vibratory intact via tuning fork bilateral. Protective threshold with Semmes Wienstein monofilament intact to all pedal sites bilateral. Patellar and Achilles deep tendon reflexes 2+ bilateral. No Babinski or clonus noted bilateral.   Musculoskeletal: No gross boney pedal deformities bilateral. No  pain, crepitus, or limitation noted with foot and ankle range of motion bilateral. Muscular strength 5/5 in all groups tested bilateral.  Pain on palpation particular frontal plane range of motion of the tarsometatarsal joints of the right foot.  Pain on palpation medial calcaneal tubercle of the left heel.  Some frontal plane range of motion in the midfoot is painful.  Gait: Unassisted, Nonantalgic.    Radiographs:  Radiographs taken today demonstrate  an osseously mature individual good bone mineralization.  He does have soft tissue increase in density plantar fascial calcaneal insertion site of the left heel.  Osteoarthritic changes at the tarsometatarsal joints bilaterally right greater than left.    Assessment & Plan:   Assessment: Osteoarthritis dorsal aspect bilateral foot.  Planter fasciitis left foot.  Plan: Discussed etiology pathology conservative versus surgical therapies injected his left heel today 20 mg Kenalog 5 mg Marcaine point of maximal tenderness.  Tolerated procedure well without complications.  Started him on methylprednisolone to be followed by meloxicam.  Discussed appropriate shoe gear stretching exercise ice therapy and sugar modifications we will follow-up with him in 1 month if necessary     Johnny Wilcox T. [place], [place]

## 2022-12-15 NOTE — Assessment & Plan Note (Addendum)
Chronic issue. Pain has been gradually worsening. Will refer to Orthopedics for further evaluation.

## 2022-12-15 NOTE — Assessment & Plan Note (Signed)
Chronic. Continue ASA and Lipitor daily. Follow up with Cardiology as scheduled.

## 2022-12-15 NOTE — Assessment & Plan Note (Signed)
Chronic. Stable on Lisinopril-Hydrochlorothiazide 20-25 mg daily. Continue. Lab work as outlined.

## 2022-12-15 NOTE — Patient Instructions (Signed)

## 2022-12-15 NOTE — Assessment & Plan Note (Addendum)
Chronic. Will check PSA today. He does not get up frequently at night to use the restroom. Denies difficulty with urination. He is not currently on any medications. Will continue to monitor.

## 2022-12-15 NOTE — Assessment & Plan Note (Signed)
Chronic. Stable on Lipitor 80 mg daily. Continue. Will check lipids.

## 2023-01-12 ENCOUNTER — Ambulatory Visit: Payer: PRIVATE HEALTH INSURANCE | Admitting: Podiatry

## 2023-01-12 ENCOUNTER — Encounter: Payer: Self-pay | Admitting: Podiatry

## 2023-01-12 DIAGNOSIS — M722 Plantar fascial fibromatosis: Secondary | ICD-10-CM | POA: Diagnosis not present

## 2023-01-12 NOTE — Progress Notes (Signed)
He presents today for follow-up of his Planter fasciitis of left foot states that it feels better really has not hurt me at all continues to take his meloxicam on a regular basis states that is even helping with his sciatica to some degree.  Objective: Vital signs stable oriented x 3 pulses are palpable.  He has no pain on palpation medial calcaneal tubercle.  Assessment: Well-healing Planter fasciitis.  Plan: Continue meloxicam through the next refill and then discontinue.  Follow-up with me with any questions or concerns.

## 2023-03-22 ENCOUNTER — Other Ambulatory Visit: Payer: Self-pay

## 2023-03-22 DIAGNOSIS — E785 Hyperlipidemia, unspecified: Secondary | ICD-10-CM

## 2023-03-22 DIAGNOSIS — I25118 Atherosclerotic heart disease of native coronary artery with other forms of angina pectoris: Secondary | ICD-10-CM

## 2023-03-22 DIAGNOSIS — Z79899 Other long term (current) drug therapy: Secondary | ICD-10-CM

## 2023-03-22 DIAGNOSIS — I10 Essential (primary) hypertension: Secondary | ICD-10-CM

## 2023-03-22 MED ORDER — ATORVASTATIN CALCIUM 80 MG PO TABS: 80.0000 mg | ORAL_TABLET | Freq: Every day | ORAL | 0 refills | Status: DC

## 2023-03-22 MED ORDER — LISINOPRIL-HYDROCHLOROTHIAZIDE 20-25 MG PO TABS: 1.0000 | ORAL_TABLET | Freq: Every day | ORAL | 0 refills | Status: DC

## 2023-03-29 NOTE — Progress Notes (Unsigned)
Cardiology Office Note    Date:  03/31/2023  ID:  [name], [name] [birth year], MRN [id number] PCP:  Bethanie Dicker, NP  Cardiologist:  Meriam Sprague, MD (Inactive)  Electrophysiologist:  None   Chief Complaint: f/u CAD  History of Present Illness: .    Johnny Wilcox is a 72 y.o. male with visit-pertinent history of CAD s/p DES to RCA, baseline sinus bradycardia, HTN, hyperlipidemia, ED, chew tobacco, obesity who presents for overdue follow-up. In 2015 he underwent stress testing which was abnormal. This prompted cardiac cath which showed 30% prox LAD, 90% RCA mid RCA s/p DES, EF 55-65%. 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  It was normal with good exercise capacity.   He returns for follow-up doing great without any recent chest pain or dyspnea. 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 He states he was recently recertified for DOT and they did not require any additional testing at this time. His BP is running higher today. He took his medication a few hours ago but reports there has been increased stress at home related to marital argument. His daughter also suffered an anoxic brain injury a few years ago.   Labwork independently reviewed: 11/2022 LDL 37, A1c 5.7, Hgb 13.2, plt 232, K 3.7, Cr 1.29 (slightly higher than prior), LFTs ok, TSH OK, trig 246  ROS: .    Please see the history of present illness.  All other systems are reviewed and otherwise negative.  Studies Reviewed: [name]    EKG:  EKG is ordered today, personally reviewed, demonstrating Sinus bradycardia 50bpm, nonspecific TWI lead III otherwise nonacute  CV Studies: Cardiac studies reviewed are outlined and summarized above. Otherwise please see EMR for full report.   Current Reported Medications:.    Current Meds  Medication Sig   aspirin EC 81 MG tablet Take 81 mg by mouth daily.   atorvastatin (LIPITOR) 80 MG tablet Take 1 tablet (80 mg total) by mouth daily.    lisinopril-hydrochlorothiazide (ZESTORETIC) 20-25 MG tablet Take 1 tablet by mouth daily.   meloxicam (MOBIC) 15 MG tablet Take 1 tablet (15 mg total) by mouth daily.    Physical Exam:    VS:  BP (!) 142/82   Pulse (!) 50   Ht 5' 8.5" (1.74 m)   Wt 234 lb 12.8 oz (106.5 kg)   SpO2 97%   BMI 35.18 kg/m    Wt Readings from Last 3 Encounters:  03/31/23 234 lb 12.8 oz (106.5 kg)  12/08/22 229 lb (103.9 kg)  04/05/22 231 lb 6.4 oz (105 kg)    GEN: Well nourished, well developed in no acute distress NECK: No JVD; No carotid bruits CARDIAC: RRR, no murmurs, rubs, gallops RESPIRATORY:  Clear to auscultation without rales, wheezing or rhonchi  ABDOMEN: Soft, non-tender, non-distended EXTREMITIES:  No edema; No acute deformity   Asessement and Plan:.    1. CAD, HLD - asymptomatic, doing well. Continue ASA, atorvastatin 80mg  daily, not on BB due to baseline bradycardia. LDL at goal by 11/2022 labs. He continues to work. He states he saw DOT recertifier recently and they did not require any updated stress testing. I told him to let us know if they request this going forward and we can order. It's been nearly 10 years since his original cath/EF assessment. Given that we may need to titrate his antihypertensives (see below), will obtain baseline echocardiogram as he has not had one before to help optimize titration  if needed.  2. HTN - Suboptimal blood pressure control noted today. We need a better idea of what it's running at home. The patient was provided instructions on monitoring blood pressure at home for 1 week and relaying results to our office. Get BMET today. Plan echo as above.  3. Sinus bradycardia - appears at baseline for patient, getting echo for baseline as above.  4. Chew tobacco use - cessation advised but he is not interested in quitting at this time, states he plans to die happy and with a full stomach.  5. AKI - slight uptrend in BUN/Cr by summer labs, will recheck. Patient  thinks this might be due to working 16 hour days in the heat of [place] around that time.      Disposition: F/u with Dr. Lynnette Caffey in 6 months to establish care upon Dr. Devin Going departure.  Signed, Laurann Montana, PA-C

## 2023-03-31 ENCOUNTER — Ambulatory Visit: Payer: PRIVATE HEALTH INSURANCE | Attending: Physician Assistant | Admitting: Physician Assistant

## 2023-03-31 ENCOUNTER — Encounter: Payer: Self-pay | Admitting: Physician Assistant

## 2023-03-31 VITALS — BP 142/82 | HR 50 | Ht 68.5 in | Wt 234.8 lb

## 2023-03-31 DIAGNOSIS — I495 Sick sinus syndrome: Secondary | ICD-10-CM

## 2023-03-31 DIAGNOSIS — I2510 Atherosclerotic heart disease of native coronary artery without angina pectoris: Secondary | ICD-10-CM | POA: Diagnosis not present

## 2023-03-31 DIAGNOSIS — I10 Essential (primary) hypertension: Secondary | ICD-10-CM

## 2023-03-31 DIAGNOSIS — N179 Acute kidney failure, unspecified: Secondary | ICD-10-CM

## 2023-03-31 DIAGNOSIS — E785 Hyperlipidemia, unspecified: Secondary | ICD-10-CM

## 2023-03-31 DIAGNOSIS — R001 Bradycardia, unspecified: Secondary | ICD-10-CM

## 2023-03-31 DIAGNOSIS — Z720 Tobacco use: Secondary | ICD-10-CM

## 2023-03-31 NOTE — Patient Instructions (Addendum)
Medication Instructions:  Your physician recommends that you continue on your current medications as directed. Please refer to the Current Medication list given to you today.  *If you need a refill on your cardiac medications before your next appointment, please call your pharmacy*   Lab Work: TODAY:  BMET  If you have labs (blood work) drawn today and your tests are completely normal, you will receive your results only by: MyChart Message (if you have MyChart) OR A paper copy in the mail If you have any lab test that is abnormal or we need to change your treatment, we will call you to review the results.   Testing/Procedures: Your physician has requested that you have an echocardiogram. Echocardiography is a painless test that uses sound waves to create images of your heart. It provides your doctor with information about the size and shape of your heart and how well your heart's chambers and valves are working. This procedure takes approximately one hour. There are no restrictions for this procedure. Please do NOT wear cologne, perfume, aftershave, or lotions (deodorant is allowed). Please arrive 15 minutes prior to your appointment time.    Follow-Up: At [facility], you and your health needs are our priority.  As part of our continuing mission to provide you with exceptional heart care, we have created designated Provider Care Teams.  These Care Teams include your primary Cardiologist (physician) and Advanced Practice Providers (APPs -  Physician Assistants and Nurse Practitioners) who all work together to provide you with the care you need, when you need it.  We recommend signing up for the patient portal called "MyChart".  Sign up information is provided on this After Visit Summary.  MyChart is used to connect with patients for Virtual Visits (Telemedicine).  Patients are able to view lab/test results, encounter notes, upcoming appointments, etc.  Non-urgent messages can be  sent to your provider as well.   To learn more about what you can do with MyChart, go to [email or web address].    Your next appointment:   6 month(s)  Provider:    Alverda Skeans, MD Shari Prows transfer)  Other Instructions We need to get a better idea of what your blood pressure is running at home. Here are some instructions to follow: - I would recommend using a blood pressure cuff that goes on your arm. The wrist ones can be inaccurate. If you're purchasing one for the first time, try to select one that also reports your heart rate because this can be helpful information as well. - To check your blood pressure, choose a time at least 3 hours after taking your blood pressure medicines. If you can sample it at different times of the day, that's great - it might give you more information about how your blood pressure fluctuates. Remain seated in a chair for 5 minutes quietly beforehand, then check it.  - Please record a list of those readings and call us/send in MyChart message with them for our review in 1 week.

## 2023-04-01 LAB — BASIC METABOLIC PANEL
BUN/Creatinine Ratio: 18 (ref 10–24)
BUN: 17 mg/dL (ref 8–27)
CO2: 24 mmol/L (ref 20–29)
Calcium: 9.1 mg/dL (ref 8.6–10.2)
Chloride: 102 mmol/L (ref 96–106)
Creatinine, Ser: 0.95 mg/dL (ref 0.76–1.27)
Glucose: 96 mg/dL (ref 70–99)
Potassium: 3.8 mmol/L (ref 3.5–5.2)
Sodium: 141 mmol/L (ref 134–144)
eGFR: 85 mL/min/{1.73_m2} (ref >59)

## 2023-04-05 ENCOUNTER — Other Ambulatory Visit: Payer: Self-pay | Admitting: Podiatry

## 2023-04-28 ENCOUNTER — Ambulatory Visit (HOSPITAL_COMMUNITY): Payer: PRIVATE HEALTH INSURANCE | Attending: Physician Assistant

## 2023-04-28 DIAGNOSIS — I10 Essential (primary) hypertension: Secondary | ICD-10-CM | POA: Diagnosis present

## 2023-04-28 DIAGNOSIS — I2510 Atherosclerotic heart disease of native coronary artery without angina pectoris: Secondary | ICD-10-CM | POA: Insufficient documentation

## 2023-04-28 DIAGNOSIS — E785 Hyperlipidemia, unspecified: Secondary | ICD-10-CM | POA: Diagnosis present

## 2023-04-28 DIAGNOSIS — N179 Acute kidney failure, unspecified: Secondary | ICD-10-CM | POA: Insufficient documentation

## 2023-04-28 DIAGNOSIS — R001 Bradycardia, unspecified: Secondary | ICD-10-CM | POA: Insufficient documentation

## 2023-04-28 DIAGNOSIS — Z720 Tobacco use: Secondary | ICD-10-CM | POA: Insufficient documentation

## 2023-04-28 LAB — ECHOCARDIOGRAM COMPLETE
Area-P 1/2: 3.1 cm2
S' Lateral: 2.7 cm

## 2023-05-04 ENCOUNTER — Telehealth: Payer: Self-pay

## 2023-05-04 NOTE — Telephone Encounter (Signed)
Called patient to discuss echo results, no answer, left detailed message per DPR asking patient to call our office.

## 2023-05-04 NOTE — Telephone Encounter (Signed)
-----   Message from Laurann Montana sent at 04/29/2023  8:32 AM EST ----- Please let pt know echo shows normal pump function. There is stiffness of the heart muscle called diastolic dysfunction which means we need to make sure we are being very strict with his blood pressure control. At recent OV 03/2023 he was to relay BP readings in 1 week, do not yet see follow-up message - please let us know what that is running. Valves look OK. There is mild enlargement of the aorta, the blood vessel that comes out of his chest. 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 Thank you.

## 2023-05-19 ENCOUNTER — Telehealth: Payer: Self-pay

## 2023-05-19 DIAGNOSIS — I77819 Aortic ectasia, unspecified site: Secondary | ICD-10-CM

## 2023-05-19 NOTE — Telephone Encounter (Signed)
-----   Message from Laurann Montana sent at 05/19/2023  9:54 AM EST ----- [Received msg today Lars Mage, RN ->  Dunn, Tacey Ruiz, PA-C - Spoke with patient abt results. He states he gave the ECHO tech a log sheet of 4 weeks' worth of readings. Says he doesn't think he still has the list, but is staying in 120's/130's on top. He knows to call us if it starts trending up.]  Update noted, thank you! I do not yet see the CTA of the aorta ordered, please see original result note on 11/15 regarding this. Since BP was elevated at time of echo and he requires good control for DOT certification, please see if he can relay the last few days of readings verbally as soon as he has them so we can decide on plan to increase his BP medicine further.

## 2023-05-19 NOTE — Telephone Encounter (Signed)
Laurann Montana, PA-C 04/29/2023  8:32 AM EST     Please let pt know echo shows normal pump function. There is stiffness of the heart muscle called diastolic dysfunction which means we need to make sure we are being very strict with his blood pressure control. At recent OV 03/2023 he was to relay BP readings in 1 week, do not yet see follow-up message - please let us know what that is running. Valves look OK. There is mild enlargement of the aorta, the blood vessel that comes out of his chest. 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 Thank you.   Order placed for CTA of Aorta at this time. Pt aware that he will be called to schedule.

## 2023-06-09 ENCOUNTER — Ambulatory Visit (INDEPENDENT_AMBULATORY_CARE_PROVIDER_SITE_OTHER): Payer: Medicare Other | Admitting: Nurse Practitioner

## 2023-06-09 ENCOUNTER — Encounter: Payer: Self-pay | Admitting: Nurse Practitioner

## 2023-06-09 VITALS — BP 110/68 | HR 90 | Temp 98.1°F | Ht 68.5 in | Wt 234.0 lb

## 2023-06-09 DIAGNOSIS — M1711 Unilateral primary osteoarthritis, right knee: Secondary | ICD-10-CM | POA: Diagnosis not present

## 2023-06-09 DIAGNOSIS — I25118 Atherosclerotic heart disease of native coronary artery with other forms of angina pectoris: Secondary | ICD-10-CM

## 2023-06-09 DIAGNOSIS — E782 Mixed hyperlipidemia: Secondary | ICD-10-CM | POA: Diagnosis not present

## 2023-06-09 DIAGNOSIS — Z23 Encounter for immunization: Secondary | ICD-10-CM

## 2023-06-09 DIAGNOSIS — L309 Dermatitis, unspecified: Secondary | ICD-10-CM

## 2023-06-09 DIAGNOSIS — I10 Essential (primary) hypertension: Secondary | ICD-10-CM | POA: Diagnosis not present

## 2023-06-09 NOTE — Assessment & Plan Note (Signed)
They experience chronic knee pain, worsened by cold and damp conditions, with a history of knee surgeries. Their pain is currently managed with Meloxicam, which we will continue as needed. Follow up with [facility] as scheduled.

## 2023-06-09 NOTE — Assessment & Plan Note (Signed)
Chronic. Stable on Lipitor 80mg  daily, which we will continue. LDL- 37 in June 2024.

## 2023-06-09 NOTE — Progress Notes (Signed)
Bethanie Dicker, NP-C Phone: [phone]  Johnny Wilcox is a 72 y.o. male who presents today for follow up.   Discussed the use of AI scribe software for clinical note transcription with the patient, who gave verbal consent to proceed.  History of Present Illness   The patient, a 72 year old with a history of hypertension, hyperlipidemia and CAD, presents with a chief complaint of a persistent skin condition on the face. The condition, characterized by peeling skin, has been present for a couple of months and has slowly spread up the side of the nose and towards the forehead. The patient has tried various topical ointments, including antibiotic creams, but has found no relief. The condition does not itch but is bothersome due to its appearance.  In addition to the skin condition, the patient reports a recent increase in knee pain, attributed to exposure to cold, wet conditions. The pain is not unbearable and is managed with meloxicam. The patient also reports a history of foot problems, which have improved following a cortisone shot and podiatry intervention.  The patient has been monitoring their blood pressure at home, with readings typically in the range of 100-120/60-70. However, recent readings have been slightly elevated, possibly due to stress. The patient is currently on Lisinopril, Hydrochlorothiazide, and Lipitor, and reports no adverse effects from these medications.  The patient denies any chest pain, shortness of breath, dizziness, or swelling. They are scheduled for a follow-up with cardiology and are otherwise in stable condition.      Social History   Tobacco Use  Smoking Status Never   Passive exposure: Never  Smokeless Tobacco Current   Types: Chew   Last attempt to quit: 09/06/2011    Current Outpatient Medications on File Prior to Visit  Medication Sig Dispense Refill   aspirin EC 81 MG tablet Take 81 mg by mouth daily.     atorvastatin (LIPITOR) 80 MG tablet Take 1  tablet (80 mg total) by mouth daily. 90 tablet 0   lisinopril-hydrochlorothiazide (ZESTORETIC) 20-25 MG tablet Take 1 tablet by mouth daily. 90 tablet 0   meloxicam (MOBIC) 15 MG tablet TAKE 1 TABLET (15 MG TOTAL) BY MOUTH DAILY. 30 tablet 3   No current facility-administered medications on file prior to visit.     ROS see history of present illness  Objective  Physical Exam Vitals:   06/09/23 0815 06/09/23 0828  BP: 134/72 110/68  Pulse: 90   Temp: 98.1 F (36.7 C)   SpO2: 95%     BP Readings from Last 3 Encounters:  06/09/23 110/68  03/31/23 (!) 142/82  12/08/22 130/72   Wt Readings from Last 3 Encounters:  06/09/23 234 lb (106.1 kg)  03/31/23 234 lb 12.8 oz (106.5 kg)  12/08/22 229 lb (103.9 kg)    Physical Exam Constitutional:      General: He is not in acute distress.    Appearance: Normal appearance.  HENT:     Head: Normocephalic.  Cardiovascular:     Rate and Rhythm: Normal rate and regular rhythm.     Heart sounds: Normal heart sounds.  Pulmonary:     Effort: Pulmonary effort is normal.     Breath sounds: Normal breath sounds.  Skin:    General: Skin is warm and dry.     Findings: Rash (nose and between eyebrows, peeling skin) present. Rash is scaling.  Neurological:     General: No focal deficit present.     Mental Status: He is alert.  Psychiatric:  Mood and Affect: Mood normal.        Behavior: Behavior normal.    Assessment/Plan: Please see individual problem list.  Facial dermatitis Assessment & Plan: They exhibit a chronic, slowly spreading, non-pruritic, scaling rash on their face, which has not improved with topical antibiotics or moisturizers. They declined a referral to dermatology. We will start over the counter Hydrocortisone cream twice daily. Advised to contact if symptoms persist or are worsening.    Osteoarthritis of right knee, unspecified osteoarthritis type Assessment & Plan: They experience chronic knee pain, worsened  by cold and damp conditions, with a history of knee surgeries. Their pain is currently managed with Meloxicam, which we will continue as needed. Follow up with [facility] as scheduled.    Primary hypertension Assessment & Plan: Chronic. Their blood pressure was slightly elevated today at 134/72, although home readings usually range between 100-120/60-70. They are on Lisinopril-Hydrochlorothiazide 20-25mg  daily. Improvement with second blood pressure reading. We will continue the current antihypertensive regimen.   Mixed hyperlipidemia Assessment & Plan: Chronic. Stable on Lipitor 80mg  daily, which we will continue. LDL- 37 in June 2024.    Coronary artery disease involving native coronary artery of native heart with other form of angina pectoris [facility]) Assessment & Plan: Chronic. Continue ASA and Lipitor daily. Follow up with Cardiology as scheduled.    Need for influenza vaccination -     Flu Vaccine Trivalent High Dose (Fluad)    Return in about 6 months (around 12/08/2023) for Follow up.   Bethanie Dicker, NP-C [facility]

## 2023-06-09 NOTE — Assessment & Plan Note (Addendum)
They exhibit a chronic, slowly spreading, non-pruritic, scaling rash on their face, which has not improved with topical antibiotics or moisturizers. They declined a referral to dermatology. We will start over the counter Hydrocortisone cream twice daily. Advised to contact if symptoms persist or are worsening.

## 2023-06-09 NOTE — Assessment & Plan Note (Signed)
Chronic. Continue ASA and Lipitor daily. Follow up with Cardiology as scheduled.

## 2023-06-09 NOTE — Assessment & Plan Note (Signed)
Chronic. Their blood pressure was slightly elevated today at 134/72, although home readings usually range between 100-120/60-70. They are on Lisinopril-Hydrochlorothiazide 20-25mg  daily. Improvement with second blood pressure reading. We will continue the current antihypertensive regimen.

## 2023-06-17 ENCOUNTER — Other Ambulatory Visit: Payer: Self-pay | Admitting: Physician Assistant

## 2023-06-17 DIAGNOSIS — I10 Essential (primary) hypertension: Secondary | ICD-10-CM

## 2023-06-17 DIAGNOSIS — Z79899 Other long term (current) drug therapy: Secondary | ICD-10-CM

## 2023-06-17 DIAGNOSIS — E785 Hyperlipidemia, unspecified: Secondary | ICD-10-CM

## 2023-06-17 DIAGNOSIS — I25118 Atherosclerotic heart disease of native coronary artery with other forms of angina pectoris: Secondary | ICD-10-CM

## 2023-07-12 ENCOUNTER — Other Ambulatory Visit: Payer: Self-pay

## 2023-07-12 DIAGNOSIS — I10 Essential (primary) hypertension: Secondary | ICD-10-CM

## 2023-07-12 DIAGNOSIS — E785 Hyperlipidemia, unspecified: Secondary | ICD-10-CM

## 2023-07-12 DIAGNOSIS — Z79899 Other long term (current) drug therapy: Secondary | ICD-10-CM

## 2023-07-12 DIAGNOSIS — I25118 Atherosclerotic heart disease of native coronary artery with other forms of angina pectoris: Secondary | ICD-10-CM

## 2023-07-12 MED ORDER — ATORVASTATIN CALCIUM 80 MG PO TABS: 80.0000 mg | ORAL_TABLET | Freq: Every day | ORAL | 2 refills | Status: DC

## 2023-07-12 MED ORDER — LISINOPRIL-HYDROCHLOROTHIAZIDE 20-25 MG PO TABS: 1.0000 | ORAL_TABLET | Freq: Every day | ORAL | 2 refills | Status: DC

## 2023-08-01 ENCOUNTER — Other Ambulatory Visit: Payer: Self-pay | Admitting: Podiatry

## 2023-09-14 ENCOUNTER — Telehealth: Payer: Self-pay | Admitting: Podiatry

## 2023-09-14 MED ORDER — MELOXICAM 15 MG PO TABS: 15.0000 mg | ORAL_TABLET | Freq: Every day | ORAL | 3 refills | Status: AC

## 2023-09-14 NOTE — Telephone Encounter (Signed)
 Meloxicam Rx sent to [facility] pharmacy electronically for 90 day supply

## 2023-09-14 NOTE — Telephone Encounter (Signed)
 [facility] pharmacy called stating this pt would like a RX sent to them for mail order Meloxicam please fax to [phone]. [facility] fax number is [phone]. Rx was sent to [facility] in [place].

## 2023-09-14 NOTE — Addendum Note (Signed)
 Addended by: Kristian Covey on: 09/14/2023 10:29 AM   Modules accepted: Orders

## 2023-12-08 ENCOUNTER — Ambulatory Visit: Payer: Medicare Other | Admitting: Nurse Practitioner

## 2023-12-08 VITALS — BP 130/76 | HR 58 | Temp 97.8°F | Ht 68.5 in | Wt 233.0 lb

## 2023-12-08 DIAGNOSIS — E669 Obesity, unspecified: Secondary | ICD-10-CM | POA: Diagnosis not present

## 2023-12-08 DIAGNOSIS — M1711 Unilateral primary osteoarthritis, right knee: Secondary | ICD-10-CM | POA: Diagnosis not present

## 2023-12-08 DIAGNOSIS — N401 Enlarged prostate with lower urinary tract symptoms: Secondary | ICD-10-CM

## 2023-12-08 DIAGNOSIS — I10 Essential (primary) hypertension: Secondary | ICD-10-CM

## 2023-12-08 DIAGNOSIS — Z1329 Encounter for screening for other suspected endocrine disorder: Secondary | ICD-10-CM | POA: Diagnosis not present

## 2023-12-08 DIAGNOSIS — E782 Mixed hyperlipidemia: Secondary | ICD-10-CM | POA: Diagnosis not present

## 2023-12-08 DIAGNOSIS — Z131 Encounter for screening for diabetes mellitus: Secondary | ICD-10-CM | POA: Diagnosis not present

## 2023-12-08 DIAGNOSIS — R350 Frequency of micturition: Secondary | ICD-10-CM

## 2023-12-08 LAB — COMPREHENSIVE METABOLIC PANEL WITH GFR
ALT: 17 U/L (ref 0–53)
AST: 18 U/L (ref 0–37)
Albumin: 4.3 g/dL (ref 3.5–5.2)
Alkaline Phosphatase: 68 U/L (ref 39–117)
BUN: 38 mg/dL — ABNORMAL HIGH (ref 6–23)
CO2: 30 meq/L (ref 19–32)
Calcium: 8.8 mg/dL (ref 8.4–10.5)
Chloride: 103 meq/L (ref 96–112)
Creatinine, Ser: 1.08 mg/dL (ref 0.40–1.50)
GFR: 67.75 mL/min (ref >60.00)
Glucose, Bld: 118 mg/dL — ABNORMAL HIGH (ref 70–99)
Potassium: 3.8 meq/L (ref 3.5–5.1)
Sodium: 139 meq/L (ref 135–145)
Total Bilirubin: 0.6 mg/dL (ref 0.2–1.2)
Total Protein: 6.4 g/dL (ref 6.0–8.3)

## 2023-12-08 LAB — CBC WITH DIFFERENTIAL/PLATELET
Basophils Absolute: 0 10*3/uL (ref 0.0–0.1)
Basophils Relative: 0.6 % (ref 0.0–3.0)
Eosinophils Absolute: 0.2 10*3/uL (ref 0.0–0.7)
Eosinophils Relative: 3.9 % (ref 0.0–5.0)
HCT: 40.9 % (ref 39.0–52.0)
Hemoglobin: 13.7 g/dL (ref 13.0–17.0)
Lymphocytes Relative: 20.8 % (ref 12.0–46.0)
Lymphs Abs: 1 10*3/uL (ref 0.7–4.0)
MCHC: 33.6 g/dL (ref 30.0–36.0)
MCV: 95.5 fl (ref 78.0–100.0)
Monocytes Absolute: 0.5 10*3/uL (ref 0.1–1.0)
Monocytes Relative: 11.3 % (ref 3.0–12.0)
Neutro Abs: 3 10*3/uL (ref 1.4–7.7)
Neutrophils Relative %: 63.4 % (ref 43.0–77.0)
Platelets: 186 10*3/uL (ref 150.0–400.0)
RBC: 4.28 Mil/uL (ref 4.22–5.81)
RDW: 14 % (ref 11.5–15.5)
WBC: 4.7 10*3/uL (ref 4.0–10.5)

## 2023-12-08 LAB — HEMOGLOBIN A1C: Hgb A1c MFr Bld: 5.9 % (ref 4.6–6.5)

## 2023-12-08 LAB — PSA: PSA: 1.28 ng/mL (ref 0.10–4.00)

## 2023-12-08 LAB — LIPID PANEL
Cholesterol: 95 mg/dL (ref 0–200)
HDL: 35.9 mg/dL — ABNORMAL LOW (ref >39.00)
LDL Cholesterol: 39 mg/dL (ref 0–99)
NonHDL: 59.1
Total CHOL/HDL Ratio: 3
Triglycerides: 101 mg/dL (ref 0.0–149.0)
VLDL: 20.2 mg/dL (ref 0.0–40.0)

## 2023-12-08 LAB — TSH: TSH: 1.23 u[IU]/mL (ref 0.35–5.50)

## 2023-12-08 NOTE — Progress Notes (Unsigned)
 Leron Glance, NP-C Phone: [phone]  Johnny Wilcox is a 73 y.o. male who presents today for follow up.   Discussed the use of AI scribe software for clinical note transcription with the patient, who gave verbal consent to proceed.  History of Present Illness   Johnny Wilcox is a 73 year old male who presents with persistent right knee pain.  He has persistent right knee pain that was temporarily relieved by an injection earlier this month, lasting only about eight hours. The pain is severe enough to cause nausea and is exacerbated by bending the knee. It became particularly significant during a trip from [place]  to [place] , prompting him to seek medical attention. The injection was administered approximately three weeks ago.  He is currently taking meloxicam , which has alleviated his hip pain but has not significantly improved his knee pain. Previously, hip pain affected his ability to drive long distances, requiring stops every two hours, but this has improved with medication.  No chest pain, shortness of breath, dizziness, or swelling in the legs. He experiences frequent urination but no bowel movement issues. He previously tried tamsulosin for urinary symptoms but discontinued it due to fatigue.  He is also taking Lipitor  for cholesterol and lisinopril  with hydrochlorothiazide  for blood pressure management. He has not experienced any abdominal pain and does not regularly check his blood pressure at home due to frequent travel.  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 He has been with the company for 51 years and travels frequently, including recent trips to [place]  and [place], [place] . He reports a busy schedule with long working hours, contributing to irritability.      Social History   Tobacco Use  Smoking Status Never   Passive exposure: Never  Smokeless Tobacco Current   Types: Chew   Last attempt to quit: 09/06/2011     Current Outpatient Medications on File Prior to Visit  Medication Sig Dispense Refill   aspirin  EC 81 MG tablet Take 81 mg by mouth daily.     atorvastatin  (LIPITOR ) 80 MG tablet Take 1 tablet (80 mg total) by mouth daily. 90 tablet 2   lisinopril -hydrochlorothiazide  (ZESTORETIC ) 20-25 MG tablet Take 1 tablet by mouth daily. 90 tablet 2   meloxicam  (MOBIC ) 15 MG tablet Take 1 tablet (15 mg total) by mouth daily. 90 tablet 3   No current facility-administered medications on file prior to visit.     ROS see history of present illness  Objective  Physical Exam Vitals:   12/08/23 0809  BP: 130/76  Pulse: (!) 58  Temp: 97.8 F (36.6 C)  SpO2: 97%    BP Readings from Last 3 Encounters:  12/08/23 130/76  06/09/23 110/68  03/31/23 (!) 142/82   Wt Readings from Last 3 Encounters:  12/08/23 233 lb (105.7 kg)  06/09/23 234 lb (106.1 kg)  03/31/23 234 lb 12.8 oz (106.5 kg)    Physical Exam Constitutional:      General: He is not in acute distress.    Appearance: Normal appearance.  HENT:     Head: Normocephalic.  Cardiovascular:     Rate and Rhythm: Normal rate and regular rhythm.     Heart sounds: Normal heart sounds.  Pulmonary:     Effort: Pulmonary effort is normal.     Breath sounds: Normal breath sounds.  Skin:    General: Skin is warm and dry.  Neurological:     General: No focal deficit present.  Mental Status: He is alert.  Psychiatric:        Mood and Affect: Mood normal.        Behavior: Behavior normal.      Assessment/Plan: Please see individual problem list.  Primary hypertension Assessment & Plan: Blood pressure is adequately controlled on lisinopril  - hydrochlorothiazide  20-25 mg daily Continue current antihypertensive regimen. Lab work as outlined.   Orders: -     Comprehensive metabolic panel with GFR -     CBC with Differential/Platelet  Primary osteoarthritis of right knee Assessment & Plan: Chronic pain has recently worsened.  An injection provided short-term relief, but orthopedic evaluation does not indicate the need for replacement. Meloxicam  is ineffective. Follow up with an orthopedic specialist for further evaluation and management.   Mixed hyperlipidemia Assessment & Plan: Managed with Lipitor  80 mg daily. Continue. Check lipid panel today.   Orders: -     Lipid panel  Benign prostatic hyperplasia with urinary frequency Assessment & Plan: He experiences frequent urination with urgency and has declined finasteride  due to potential side effects. He is not interested in alternative medications currently. Monitor urinary symptoms and consider alternative medications if symptoms worsen or he desires treatment.  Orders: -     PSA  Obesity (BMI 30-39.9) -     Hemoglobin A1c  Thyroid disorder screen -     TSH  Diabetes mellitus screening -     Hemoglobin A1c     Return in about 6 months (around 06/08/2024).   Leron Glance, NP-C [facility]

## 2023-12-09 ENCOUNTER — Ambulatory Visit: Payer: Self-pay | Admitting: Nurse Practitioner

## 2023-12-21 ENCOUNTER — Encounter: Payer: Self-pay | Admitting: Nurse Practitioner

## 2023-12-21 NOTE — Assessment & Plan Note (Signed)
 Blood pressure is adequately controlled on lisinopril  - hydrochlorothiazide  20-25 mg daily Continue current antihypertensive regimen. Lab work as outlined.

## 2023-12-21 NOTE — Assessment & Plan Note (Signed)
 Managed with Lipitor  80 mg daily. Continue. Check lipid panel today.

## 2023-12-21 NOTE — Assessment & Plan Note (Signed)
 He experiences frequent urination with urgency and has declined finasteride  due to potential side effects. He is not interested in alternative medications currently. Monitor urinary symptoms and consider alternative medications if symptoms worsen or he desires treatment.

## 2023-12-21 NOTE — Assessment & Plan Note (Signed)
 Chronic pain has recently worsened. An injection provided short-term relief, but orthopedic evaluation does not indicate the need for replacement. Meloxicam  is ineffective. Follow up with an orthopedic specialist for further evaluation and management.

## 2024-02-22 ENCOUNTER — Other Ambulatory Visit: Payer: Self-pay | Admitting: Internal Medicine

## 2024-02-22 DIAGNOSIS — Z79899 Other long term (current) drug therapy: Secondary | ICD-10-CM

## 2024-02-22 DIAGNOSIS — E785 Hyperlipidemia, unspecified: Secondary | ICD-10-CM

## 2024-02-22 DIAGNOSIS — I25118 Atherosclerotic heart disease of native coronary artery with other forms of angina pectoris: Secondary | ICD-10-CM

## 2024-02-22 DIAGNOSIS — I10 Essential (primary) hypertension: Secondary | ICD-10-CM

## 2024-03-20 ENCOUNTER — Ambulatory Visit: Admitting: Nurse Practitioner

## 2024-04-17 ENCOUNTER — Encounter: Payer: Self-pay | Admitting: Nurse Practitioner

## 2024-04-17 ENCOUNTER — Ambulatory Visit: Attending: Nurse Practitioner | Admitting: Nurse Practitioner

## 2024-04-17 VITALS — BP 130/70 | HR 49 | Ht 68.0 in | Wt 243.0 lb

## 2024-04-17 DIAGNOSIS — Z720 Tobacco use: Secondary | ICD-10-CM | POA: Insufficient documentation

## 2024-04-17 DIAGNOSIS — I77810 Thoracic aortic ectasia: Secondary | ICD-10-CM | POA: Insufficient documentation

## 2024-04-17 DIAGNOSIS — R001 Bradycardia, unspecified: Secondary | ICD-10-CM | POA: Insufficient documentation

## 2024-04-17 DIAGNOSIS — I10 Essential (primary) hypertension: Secondary | ICD-10-CM | POA: Diagnosis present

## 2024-04-17 DIAGNOSIS — I2510 Atherosclerotic heart disease of native coronary artery without angina pectoris: Secondary | ICD-10-CM | POA: Insufficient documentation

## 2024-04-17 DIAGNOSIS — E785 Hyperlipidemia, unspecified: Secondary | ICD-10-CM | POA: Diagnosis present

## 2024-04-17 NOTE — Patient Instructions (Signed)
 Medication Instructions:  Your physician recommends that you continue on your current medications as directed. Please refer to the Current Medication list given to you today.  *If you need a refill on your cardiac medications before your next appointment, please call your pharmacy*  Lab Work: NONE ordered at this time of appointment   Testing/Procedures: NONE ordered at this time of appointment   Follow-Up: At [facility], you and your health needs are our priority.  As part of our continuing mission to provide you with exceptional heart care, our providers are all part of one team.  This team includes your primary Cardiologist (physician) and Advanced Practice Providers or APPs (Physician Assistants and Nurse Practitioners) who all work together to provide you with the care you need, when you need it.  Your next appointment:   1 year(s)  Provider:   Arun K Thukkani, MD    We recommend signing up for the patient portal called MyChart.  Sign up information is provided on this After Visit Summary.  MyChart is used to connect with patients for Virtual Visits (Telemedicine).  Patients are able to view lab/test results, encounter notes, upcoming appointments, etc.  Non-urgent messages can be sent to your provider as well.   To learn more about what you can do with MyChart, go to [email or web address].

## 2024-04-17 NOTE — Progress Notes (Signed)
 Office Visit    Patient Name: Johnny Wilcox Date of Encounter: 04/17/2024  Primary Care Provider:  Gretel App, NP Primary Cardiologist:  Arun K Thukkani, MD  Chief Complaint    73 year old male with a history of CAD s/p DES-RCA in 2015, mild dilation of ascending aorta, baseline sinus bradycardia, hypertension, hyperlipidemia, ED, obesity, and chew tobacco use who presents for follow-up related to CAD.  Past Medical History    Past Medical History:  Diagnosis Date   Allergy    CAD in native artery    a. abnormal nuc 2015 - s/p DES to RCA, minimal disease in prox LAD, EF 55-65%.   Cataract    removed from left eye   ED (erectile dysfunction)    Hyperlipidemia    Hypertension    Past Surgical History:  Procedure Laterality Date   CATARACT EXTRACTION Right    COLONOSCOPY     CORONARY STENT PLACEMENT     EYE SURGERY     child injury-repair.     KNEE ARTHROSCOPY Right 02/18/2017   Procedure: ARTHROSCOPY RIGHT KNEE WITH PARTIAL MEDIAL MENISECTOMY, MEDIAL CHONDROPLASTY AND PATELLA, AND MEDIAL PLICO EXCISION;  Surgeon: Yvone Rush, MD;  Location: [facility] ORS;  Service: Orthopedics;  Laterality: Right;   LEFT HEART CATHETERIZATION WITH CORONARY ANGIOGRAM N/A 09/21/2013   Procedure: LEFT HEART CATHETERIZATION WITH CORONARY ANGIOGRAM;  Surgeon: Peter M Jordan, MD;  Location[facility] CATH LAB;  Service: Cardiovascular;  Laterality: N/A;   MULTIPLE EXTRACTIONS WITH ALVEOLOPLASTY  12/06/2011   Procedure: MULTIPLE EXTRACION WITH ALVEOLOPLASTY;  Surgeon: Glendia CHRISTELLA Primrose, DDS;  Location: [facility] OR;  Service: Oral Surgery;  Laterality: Bilateral;  Biopsy tissue right vestibule   PERCUTANEOUS CORONARY STENT INTERVENTION (PCI-S)  09/21/2013   Procedure: PERCUTANEOUS CORONARY STENT INTERVENTION (PCI-S);  Surgeon: Peter M Jordan, MD;  Location: [facility] C[facility] LAB;  Service: Cardiovascular;;   POLYPECTOMY     VASECTOMY      Allergies  No Known Allergies   Labs/Other Studies Reviewed    The following studies  were reviewed today:  Cardiac Studies & Procedures   ______________________________________________________________________________________________   STRESS TESTS  EXERCISE TOLERANCE TEST (ETT) 01/30/2020  Interpretation Summary  There was no ST segment deviation noted during stress.  Exercise time: 8 minutes 9 seconds Workload: 10.1 METS, good exercise capacity Test stopped due to: Fatigue and shortness of breath BP response: Elevated blood pressure response without true hypertensive response to exercise.  Peak blood pressure 206/76 mmHg HR response: Normal Rhythm: Sinus rhythm  Upsloping ST segment changes near peak stress.  Occasional horizontal ST segment with beat-to-beat variability, without evidence of ischemia.  Normal stress ECG with good exercise capacity.   ECHOCARDIOGRAM  ECHOCARDIOGRAM COMPLETE 04/28/2023  Narrative ECHOCARDIOGRAM REPORT    Patient Name:   Johnny Wilcox Date of Exam: 04/28/2023 Medical Rec #:  [id number]     Height:       68.5 in Accession #:    [id number]    Weight:       234.8 lb Date of Birth:  Dec 16, 1950     BSA:          2.200 m Patient Age:    72 years      BP:           140/76 mmHg Patient Gender: M             HR:           50 bpm. Exam Location:  [address]  Procedure: 2D Echo,  3D Echo, Cardiac Doppler and Color Doppler  Indications:    I25.10 CAD  History:        Patient has no prior history of Echocardiogram examinations. CAD, Arrythmias:Bradycardia; Risk Factors:Family History of Coronary Artery Disease, Hypertension and Dyslipidemia.  Sonographer:    Heather Hawks RDCS Referring Phys: DAYNA N DUNN  IMPRESSIONS   1. Left ventricular ejection fraction, by estimation, is 55 to 60%. The left ventricle has normal function. The left ventricle has no regional wall motion abnormalities. Left ventricular diastolic parameters are consistent with Grade II diastolic dysfunction (pseudonormalization). 2. Right ventricular  systolic function is normal. The right ventricular size is normal. 3. Left atrial size was mild to moderately dilated. 4. Right atrial size was mildly dilated. 5. The mitral valve is normal in structure. Trivial mitral valve regurgitation. No evidence of mitral stenosis. 6. The aortic valve is normal in structure. Aortic valve regurgitation is not visualized. No aortic stenosis is present. 7. Aortic dilatation noted. There is mild dilatation of the ascending aorta, measuring 41 mm. 8. The inferior vena cava is normal in size with greater than 50% respiratory variability, suggesting right atrial pressure of 3 mmHg.  FINDINGS Left Ventricle: Left ventricular ejection fraction, by estimation, is 55 to 60%. The left ventricle has normal function. The left ventricle has no regional wall motion abnormalities. The left ventricular internal cavity size was normal in size. There is no left ventricular hypertrophy. Left ventricular diastolic parameters are consistent with Grade II diastolic dysfunction (pseudonormalization).  Right Ventricle: The right ventricular size is normal. No increase in right ventricular wall thickness. Right ventricular systolic function is normal.  Left Atrium: Left atrial size was mild to moderately dilated.  Right Atrium: Right atrial size was mildly dilated.  Pericardium: There is no evidence of pericardial effusion.  Mitral Valve: The mitral valve is normal in structure. Trivial mitral valve regurgitation. No evidence of mitral valve stenosis.  Tricuspid Valve: The tricuspid valve is normal in structure. Tricuspid valve regurgitation is trivial. No evidence of tricuspid stenosis.  Aortic Valve: The aortic valve is normal in structure. Aortic valve regurgitation is not visualized. No aortic stenosis is present.  Pulmonic Valve: The pulmonic valve was normal in structure. Pulmonic valve regurgitation is trivial. No evidence of pulmonic stenosis.  Aorta: Aortic  dilatation noted. There is mild dilatation of the ascending aorta, measuring 41 mm.  Venous: The inferior vena cava is normal in size with greater than 50% respiratory variability, suggesting right atrial pressure of 3 mmHg.  IAS/Shunts: No atrial level shunt detected by color flow Doppler.   LEFT VENTRICLE PLAX 2D LVIDd:         5.40 cm   Diastology LVIDs:         2.70 cm   LV e' medial:    5.98 cm/s LV PW:         0.90 cm   LV E/e' medial:  11.5 LV IVS:        0.80 cm   LV e' lateral:   7.51 cm/s LVOT diam:     2.20 cm   LV E/e' lateral: 9.2 LV SV:         97 LV SV Index:   44 LVOT Area:     3.80 cm  3D Volume EF: 3D EF:        45 % LV EDV:       127 ml LV ESV:       70 ml LV SV:  57 ml  RIGHT VENTRICLE RV Basal diam:  4.30 cm RV Mid diam:    3.40 cm RV S prime:     18.30 cm/s TAPSE (M-mode): 3.0 cm RVSP:           20.5 mmHg  LEFT ATRIUM             Index        RIGHT ATRIUM           Index LA diam:        4.90 cm 2.23 cm/m   RA Pressure: 3.00 mmHg LA Vol (A2C):   76.6 ml 34.82 ml/m  RA Area:     21.60 cm LA Vol (A4C):   84.4 ml 38.37 ml/m  RA Volume:   67.10 ml  30.50 ml/m LA Biplane Vol: 82.5 ml 37.50 ml/m AORTIC VALVE LVOT Vmax:   105.00 cm/s LVOT Vmean:  64.600 cm/s LVOT VTI:    0.256 m  AORTA Ao Root diam: 3.00 cm Ao Asc diam:  4.10 cm  MITRAL VALVE               TRICUSPID VALVE MV Area (PHT): cm         TR Peak grad:   17.5 mmHg MV Decel Time: 245 msec    TR Vmax:        209.00 cm/s MV E velocity: 68.80 cm/s  Estimated RAP:  3.00 mmHg MV A velocity: 57.23 cm/s  RVSP:           20.5 mmHg MV E/A ratio:  1.20 SHUNTS Systemic VTI:  0.26 m Systemic Diam: 2.20 cm  Toribio Fuel MD Electronically signed by Toribio Fuel MD Signature Date/Time: 04/28/2023/9:46:51 AM    Final          ______________________________________________________________________________________________     Recent Labs: 12/08/2023: ALT 17; BUN 38;  Creatinine, Ser 1.08; Hemoglobin 13.7; Platelets 186.0; Potassium 3.8; Sodium 139; TSH 1.23  Recent Lipid Panel    Component Value Date/Time   CHOL 95 12/08/2023 0820   CHOL 93 (L) 04/09/2022 1559   TRIG 101.0 12/08/2023 0820   HDL 35.90 (L) 12/08/2023 0820   HDL 35 (L) 04/09/2022 1559   CHOLHDL 3 12/08/2023 0820   VLDL 20.2 12/08/2023 0820   LDLCALC 39 12/08/2023 0820   LDLCALC 35 04/09/2022 1559   LDLDIRECT 37.0 12/08/2022 1614    History of Present Illness    73 year old male with the above past medical history including CAD s/p DES-RCA in 2015, mild dilation of ascending aorta, baseline sinus bradycardia, hypertension, hyperlipidemia, ED, obesity, and chew tobacco use.  He had an abnormal stress test in 2015.  Follow-up cardiac catheterization revealed 30% proximal LAD stenosis, 90% mid RCA stenosis s/p DES, EF 55-65%.  ETT in 2021 in the setting of DOT clearance was normal.  He was last seen in the office on 03/31/2023 and was doing well.  He denies symptoms concerning for angina.  Echocardiogram in 04/2023 showed EF 55 to 60%, normal LV function, no RWMA, G2 DD, normal RV systolic function, no significant valvular abnormalities, mild dilation of ascending aorta measuring 41 mm.  CT angio of the chest aorta was ordered but not completed.  He presents today to for follow-up.  Since his last visit he has done well from a cardiac standpoint.  He denies symptoms concerning for angina.  He continues to work, though he no longer requires DOT clearance.  He works full-time environmental manager. Overall,  he reports feeling well.  Home Medications    Current Outpatient Medications  Medication Sig Dispense Refill   aspirin  EC 81 MG tablet Take 81 mg by mouth daily.     atorvastatin  (LIPITOR ) 80 MG tablet Take 1 tablet (80 mg total) by mouth daily. 90 tablet 2   lisinopril -hydrochlorothiazide  (ZESTORETIC ) 20-25 MG tablet Take 1 tablet by mouth daily.  90 tablet 2   meloxicam  (MOBIC ) 15 MG tablet Take 1 tablet (15 mg total) by mouth daily. 90 tablet 3   No current facility-administered medications for this visit.     Review of Systems    He denies chest pain, palpitations, dyspnea, pnd, orthopnea, n, v, dizziness, syncope, edema, weight gain, or early satiety. All other systems reviewed and are otherwise negative except as noted above.   Physical Exam    VS:  BP 130/70 (Cuff Size: Large)   Pulse (!) 49   Ht 5' 8 (1.727 m)   Wt 243 lb (110.2 kg)   SpO2 98%   BMI 36.95 kg/m  GEN: Well nourished, well developed, in no acute distress. HEENT: normal. Neck: Supple, no JVD, carotid bruits, or masses. Cardiac: RRR, no murmurs, rubs, or gallops. No clubbing, cyanosis, edema.  Radials/DP/PT 2+ and equal bilaterally.  Respiratory:  Respirations regular and unlabored, clear to auscultation bilaterally. GI: Soft, nontender, nondistended, BS + x 4. MS: no deformity or atrophy. Skin: warm and dry, no rash. Neuro:  Strength and sensation are intact. Psych: Normal affect.  Accessory Clinical Findings    ECG personally reviewed by me today - EKG Interpretation Date/Time:  Tuesday April 17 2024 08:04:22 EST Ventricular Rate:  49 PR Interval:  222 QRS Duration:  94 QT Interval:  464 QTC Calculation: 419 R Axis:   66  Text Interpretation: Sinus bradycardia with 1st degree A-V block When compared with ECG of 31-Mar-2023 08:01, No significant change was found Confirmed by Daneen Perkins ([id number]) on 04/17/2024 8:13:44 AM  - no acute changes.   Lab Results  Component Value Date   WBC 4.7 12/08/2023   HGB 13.7 12/08/2023   HCT 40.9 12/08/2023   MCV 95.5 12/08/2023   PLT 186.0 12/08/2023   Lab Results  Component Value Date   CREATININE 1.08 12/08/2023   BUN 38 (H) 12/08/2023   NA 139 12/08/2023   K 3.8 12/08/2023   CL 103 12/08/2023   CO2 30 12/08/2023   Lab Results  Component Value Date   ALT 17 12/08/2023   AST 18 12/08/2023    ALKPHOS 68 12/08/2023   BILITOT 0.6 12/08/2023   Lab Results  Component Value Date   CHOL 95 12/08/2023   HDL 35.90 (L) 12/08/2023   LDLCALC 39 12/08/2023   LDLDIRECT 37.0 12/08/2022   TRIG 101.0 12/08/2023   CHOLHDL 3 12/08/2023    Lab Results  Component Value Date   HGBA1C 5.9 12/08/2023    Assessment & Plan    1. CAD: S/p DES-RCA in 2015. Stable with no anginal symptoms. No indication for ischemic evaluation.  Continue aspirin , lisinopril -hydrochlorothiazide , Lipitor .   2. Ascending aorta dilation: Echocardiogram in 04/2023 showed EF 55 to 60%, normal LV function, no RWMA, G2 DD, normal RV systolic function, no significant valvular abnormalities, mild dilation of ascending aorta measuring 41 mm.  CT angio of the chest/aorta was ordered but not completed.  He declines further testing at this time.  Can revisit at follow-up.  3. Sinus bradycardia: Stable on today's EKG. Asymptomatic.  He is on any AV  nodal blocking agents at this time.  4. Hypertension: BP well controlled. Continue current antihypertensive regimen.   5. Hyperlipidemia: LDL was 39 in 11/2023.  Continue Lipitor .  6. Chew tobacco use: He continues to chew tobacco and is not interested in quitting.  7. Disposition: Follow-up in 1 year, sooner if needed.  Damien JAYSON Braver, NP 04/17/2024, 8:32 AM

## 2024-06-06 ENCOUNTER — Other Ambulatory Visit: Payer: Self-pay | Admitting: Internal Medicine

## 2024-06-06 DIAGNOSIS — E785 Hyperlipidemia, unspecified: Secondary | ICD-10-CM

## 2024-06-06 DIAGNOSIS — I10 Essential (primary) hypertension: Secondary | ICD-10-CM

## 2024-06-06 DIAGNOSIS — Z79899 Other long term (current) drug therapy: Secondary | ICD-10-CM

## 2024-06-06 DIAGNOSIS — I25118 Atherosclerotic heart disease of native coronary artery with other forms of angina pectoris: Secondary | ICD-10-CM

## 2024-06-08 ENCOUNTER — Encounter: Payer: Self-pay | Admitting: Nurse Practitioner

## 2024-06-08 ENCOUNTER — Ambulatory Visit (INDEPENDENT_AMBULATORY_CARE_PROVIDER_SITE_OTHER): Admitting: Nurse Practitioner

## 2024-06-08 VITALS — BP 138/82 | HR 53 | Temp 98.0°F | Ht 68.0 in | Wt 233.4 lb

## 2024-06-08 DIAGNOSIS — R350 Frequency of micturition: Secondary | ICD-10-CM | POA: Diagnosis not present

## 2024-06-08 DIAGNOSIS — N401 Enlarged prostate with lower urinary tract symptoms: Secondary | ICD-10-CM | POA: Diagnosis not present

## 2024-06-08 DIAGNOSIS — R7303 Prediabetes: Secondary | ICD-10-CM | POA: Diagnosis not present

## 2024-06-08 DIAGNOSIS — I10 Essential (primary) hypertension: Secondary | ICD-10-CM | POA: Diagnosis not present

## 2024-06-08 DIAGNOSIS — E782 Mixed hyperlipidemia: Secondary | ICD-10-CM

## 2024-06-08 DIAGNOSIS — I25118 Atherosclerotic heart disease of native coronary artery with other forms of angina pectoris: Secondary | ICD-10-CM | POA: Diagnosis not present

## 2024-06-08 LAB — LIPID PANEL
Cholesterol: 77 mg/dL (ref 28–200)
HDL: 23.9 mg/dL — ABNORMAL LOW
LDL Cholesterol: 27 mg/dL (ref 10–99)
NonHDL: 53.49
Total CHOL/HDL Ratio: 3
Triglycerides: 134 mg/dL (ref 10.0–149.0)
VLDL: 26.8 mg/dL (ref 0.0–40.0)

## 2024-06-08 LAB — COMPREHENSIVE METABOLIC PANEL WITH GFR
ALT: 21 U/L (ref 3–53)
AST: 22 U/L (ref 5–37)
Albumin: 4 g/dL (ref 3.5–5.2)
Alkaline Phosphatase: 78 U/L (ref 39–117)
BUN: 24 mg/dL — ABNORMAL HIGH (ref 6–23)
CO2: 26 meq/L (ref 19–32)
Calcium: 8.6 mg/dL (ref 8.4–10.5)
Chloride: 105 meq/L (ref 96–112)
Creatinine, Ser: 1.02 mg/dL (ref 0.40–1.50)
GFR: 72.3 mL/min
Glucose, Bld: 115 mg/dL — ABNORMAL HIGH (ref 70–99)
Potassium: 3.5 meq/L (ref 3.5–5.1)
Sodium: 139 meq/L (ref 135–145)
Total Bilirubin: 0.5 mg/dL (ref 0.2–1.2)
Total Protein: 6.2 g/dL (ref 6.0–8.3)

## 2024-06-08 NOTE — Assessment & Plan Note (Signed)
 Encourage healthy diet and regular exercise. Check A1c.

## 2024-06-08 NOTE — Assessment & Plan Note (Signed)
 Managed with Lipitor  80 mg daily. Continue. Check lipid panel today.

## 2024-06-08 NOTE — Progress Notes (Signed)
 " Leron Glance, NP-C Phone: [phone]  JARVIN OGREN is a 73 y.o. male who presents today for follow up.   Discussed the use of AI scribe software for clinical note transcription with the patient, who gave verbal consent to proceed.  History of Present Illness   LELEND HEINECKE is a 73 year old male who presents for follow up.  He continues to experience urinary incontinence, described as 'leaking.' He previously discussed medication options but chose not to pursue them due to a negative experience with a urologist and medication in the past. He has not seen a urologist recently and is uncertain if the issue warrants further attention at this time.  He recently visited a cardiologist in November, and recalls being told that his condition was good and that he did not need to return for a year. He was advised to have a CT scan but missed the appointment due to scheduling conflicts and has not rescheduled. He expresses dissatisfaction with the healthcare system, particularly with [facility], and mentions insurance changes affecting his medication refills.  No chest pain, dizziness, or leg swelling, but he reports being 'short of breath all the time,' attributing it to his age and weight. He occasionally monitors his blood pressure at home. He takes lisinopril , hydrochlorothiazide , Lipitor , aspirin , and meloxicam  regularly, although he recently ran out of meloxicam  due to insurance issues.  His diet consists mainly of meat, potatoes, and beans, influenced by his daughter's preferences. His wife has diabetes and has stopped eating much, complicating meal planning. He is frustrated with these dietary challenges.     Tobacco Use History[1]  Medications Ordered Prior to Encounter[2]   ROS see history of present illness  Objective  Physical Exam Vitals:   06/08/24 0813  BP: 138/82  Pulse: (!) 53  Temp: 98 F (36.7 C)  SpO2: 98%    BP Readings from Last 3 Encounters:  06/08/24 138/82   04/17/24 130/70  12/08/23 130/76   Wt Readings from Last 3 Encounters:  06/08/24 233 lb 6.4 oz (105.9 kg)  04/17/24 243 lb (110.2 kg)  12/08/23 233 lb (105.7 kg)    Physical Exam Constitutional:      General: He is not in acute distress.    Appearance: Normal appearance.  HENT:     Head: Normocephalic.  Cardiovascular:     Rate and Rhythm: Normal rate and regular rhythm.     Heart sounds: Normal heart sounds.  Pulmonary:     Effort: Pulmonary effort is normal.     Breath sounds: Normal breath sounds.  Skin:    General: Skin is warm and dry.  Neurological:     General: No focal deficit present.     Mental Status: He is alert.  Psychiatric:        Mood and Affect: Mood normal.        Behavior: Behavior normal.      Assessment/Plan: Please see individual problem list.  Primary hypertension Assessment & Plan: Blood pressure is adequately controlled on lisinopril  - hydrochlorothiazide  20-25 mg daily Continue current antihypertensive regimen. Lab work as outlined.   Orders: -     Comprehensive metabolic panel with GFR  Coronary artery disease involving native coronary artery of native heart with other form of angina pectoris Assessment & Plan: Coronary artery disease is well-managed. He is asymptomatic with no chest pain or shortness of breath beyond baseline. Continue current medication regimen including aspirin  and Lipitor .   Orders: -     Lipid  panel  Benign prostatic hyperplasia with urinary frequency Assessment & Plan: Previous urology consultation was unsatisfactory. He is not interested in further follow-up unless symptoms worsen. Monitor symptoms and consider further evaluation if symptoms worsen.    Mixed hyperlipidemia Assessment & Plan: Managed with Lipitor  80 mg daily. Continue. Check lipid panel today.   Orders: -     Lipid panel  Prediabetes Assessment & Plan: Encourage healthy diet and regular exercise. Check A1c.   Orders: -     Hemoglobin  A1c     Return in about 6 months (around 12/07/2024) for Follow up.   Leron Glance, NP-C [facility] [place]    [1]  Social History Tobacco Use  Smoking Status Never   Passive exposure: Never  Smokeless Tobacco Current   Types: Chew   Last attempt to quit: 09/06/2011  [2]  Current Outpatient Medications on File Prior to Visit  Medication Sig Dispense Refill   aspirin  EC 81 MG tablet Take 81 mg by mouth daily.     atorvastatin  (LIPITOR ) 80 MG tablet TAKE 1 TABLET EVERY DAY 90 tablet 3   lisinopril -hydrochlorothiazide  (ZESTORETIC ) 20-25 MG tablet TAKE 1 TABLET EVERY DAY 90 tablet 3   meloxicam  (MOBIC ) 15 MG tablet Take 1 tablet (15 mg total) by mouth daily. 90 tablet 3   No current facility-administered medications on file prior to visit.   "

## 2024-06-08 NOTE — Assessment & Plan Note (Signed)
 Previous urology consultation was unsatisfactory. He is not interested in further follow-up unless symptoms worsen. Monitor symptoms and consider further evaluation if symptoms worsen.

## 2024-06-08 NOTE — Assessment & Plan Note (Signed)
 Blood pressure is adequately controlled on lisinopril  - hydrochlorothiazide  20-25 mg daily Continue current antihypertensive regimen. Lab work as outlined.

## 2024-06-08 NOTE — Assessment & Plan Note (Signed)
 Coronary artery disease is well-managed. He is asymptomatic with no chest pain or shortness of breath beyond baseline. Continue current medication regimen including aspirin  and Lipitor .

## 2024-06-11 ENCOUNTER — Ambulatory Visit: Payer: Self-pay | Admitting: Nurse Practitioner

## 2024-06-11 LAB — HEMOGLOBIN A1C: Hgb A1c MFr Bld: 5.6 % (ref 4.6–6.5)

## 2024-07-02 ENCOUNTER — Ambulatory Visit: Admitting: *Deleted

## 2024-07-02 VITALS — BP 133/72 | HR 61 | Ht 68.0 in | Wt 230.0 lb

## 2024-07-02 DIAGNOSIS — Z0000 Encounter for general adult medical examination without abnormal findings: Secondary | ICD-10-CM | POA: Diagnosis not present

## 2024-07-02 NOTE — Patient Instructions (Signed)
 Mr. Johnny Wilcox,  Thank you for taking the time for your Medicare Wellness Visit. I appreciate your continued commitment to your health goals. Please review the care plan we discussed, and feel free to reach out if I can assist you further.  Please note that Annual Wellness Visits do not include a physical exam. Some assessments may be limited, especially if the visit was conducted virtually. If needed, we may recommend an in-person follow-up with your provider.  Ongoing Care Seeing your primary care provider every 3 to 6 months helps us  monitor your health and provide consistent, personalized care.  Remember to update your fu and shingles vaccines.   Referrals If a referral was made during today's visit and you haven't received any updates within two weeks, please contact the referred provider directly to check on the status.  Recommended Screenings:  Health Maintenance  Topic Date Due   Zoster (Shingles) Vaccine (1 of 2) 09/25/1968   COVID-19 Vaccine (4 - 2025-26 season) 02/13/2024   Flu Shot  09/11/2024*   Medicare Annual Wellness Visit  07/02/2025   Colon Cancer Screening  07/14/2025   DTaP/Tdap/Td vaccine (4 - Td or Tdap) 11/29/2031   Pneumococcal Vaccine for age over 49  Completed   Hepatitis C Screening  Completed   Meningitis B Vaccine  Aged Out  *Topic was postponed. The date shown is not the original due date.       07/02/2024   11:44 AM  Advanced Directives  Does Patient Have a Medical Advance Directive? Yes  Type of Estate Agent of [place];Living will  Does patient want to make changes to medical advance directive? No - Patient declined  Copy of Healthcare Power of Attorney in Chart? No - copy requested    Vision: Annual vision screenings are recommended for early detection of glaucoma, cataracts, and diabetic retinopathy. These exams can also reveal signs of chronic conditions such as diabetes and high blood pressure.  Dental: Annual dental  screenings help detect early signs of oral cancer, gum disease, and other conditions linked to overall health, including heart disease and diabetes.  Please see the attached documents for additional preventive care recommendations.

## 2024-07-02 NOTE — Progress Notes (Signed)
 "  Chief Complaint  Patient presents with   Medicare Wellness     Subjective:   Johnny Wilcox is a 74 y.o. male who presents for a Medicare Annual Wellness Visit.  Visit info / Clinical Intake: Medicare Wellness Visit Type:: Initial Annual Wellness Visit Persons participating in visit and providing information:: patient Medicare Wellness Visit Mode:: Telephone If telephone:: video declined Since this visit was completed virtually, some vitals may be partially provided or unavailable. Missing vitals are due to the limitations of the virtual format.: Documented vitals are patient reported If Telephone or Video please confirm:: I connected with patient using audio/video enable telemedicine. I verified patient identity with two identifiers, discussed telehealth limitations, and patient agreed to proceed. Patient Location:: Home Provider Location:: Office/home Interpreter Needed?: No Pre-visit prep was completed: yes AWV questionnaire completed by patient prior to visit?: no Living arrangements:: lives with spouse/significant other Patient's Overall Health Status Rating: good Typical amount of pain: some (knee pain for several years) Does pain affect daily life?: no Are you currently prescribed opioids?: no  Dietary Habits and Nutritional Risks How many meals a day?: 2 Eats fruit and vegetables daily?: yes Most meals are obtained by: preparing own meals In the last 2 weeks, have you had any of the following?: none Diabetic:: no  Functional Status Activities of Daily Living (to include ambulation/medication): Independent Ambulation: Independent Medication Administration: Independent Home Management (perform basic housework or laundry): Independent Manage your own finances?: yes Primary transportation is: driving Concerns about vision?: no *vision screening is required for WTM* Concerns about hearing?: no  Fall Screening Falls in the past year?: 0 Number of falls in past year:  0 Was there an injury with Fall?: 0 Fall Risk Category Calculator: 0 Patient Fall Risk Level: Low Fall Risk  Fall Risk Patient at Risk for Falls Due to: No Fall Risks Fall risk Follow up: Falls evaluation completed; Falls prevention discussed  Home and Transportation Safety: All rugs have non-skid backing?: yes All stairs or steps have railings?: yes Grab bars in the bathtub or shower?: yes Have non-skid surface in bathtub or shower?: yes Good home lighting?: yes Regular seat belt use?: yes Hospital stays in the last year:: no  Cognitive Assessment Difficulty concentrating, remembering, or making decisions? : no Will 6CIT or Mini Cog be Completed: yes What year is it?: 0 points What month is it?: 0 points Give patient an address phrase to remember (5 components): [address] [place] About what time is it?: 0 points Count backwards from 20 to 1: 0 points Say the months of the year in reverse: 0 points Repeat the address phrase from earlier: 0 points 6 CIT Score: 0 points  Advance Directives (For Healthcare) Does Patient Have a Medical Advance Directive?: Yes Does patient want to make changes to medical advance directive?: No - Patient declined Type of Advance Directive: Healthcare Power of [place]; Living will Copy of Healthcare Power of Attorney in Chart?: No - copy requested Copy of Living Will in Chart?: No - copy requested  Reviewed/Updated  Reviewed/Updated: Reviewed All (Medical, Surgical, Family, Medications, Allergies, Care Teams, Patient Goals)    Allergies (verified) Patient has no known allergies.   Current Medications (verified) Outpatient Encounter Medications as of 07/02/2024  Medication Sig   aspirin  EC 81 MG tablet Take 81 mg by mouth daily.   atorvastatin  (LIPITOR ) 80 MG tablet TAKE 1 TABLET EVERY DAY   lisinopril -hydrochlorothiazide  (ZESTORETIC ) 20-25 MG tablet TAKE 1 TABLET EVERY DAY   meloxicam  (MOBIC )  15 MG tablet Take 1 tablet (15 mg  total) by mouth daily.   No facility-administered encounter medications on file as of 07/02/2024.    History: Past Medical History:  Diagnosis Date   Allergy    CAD in native artery    a. abnormal nuc 2015 - s/p DES to RCA, minimal disease in prox LAD, EF 55-65%.   Cataract    removed from left eye   ED (erectile dysfunction)    Hyperlipidemia    Hypertension    Past Surgical History:  Procedure Laterality Date   CATARACT EXTRACTION Right    COLONOSCOPY     CORONARY STENT PLACEMENT     EYE SURGERY     child injury-repair.     KNEE ARTHROSCOPY Right 02/18/2017   Procedure: ARTHROSCOPY RIGHT KNEE WITH PARTIAL MEDIAL MENISECTOMY, MEDIAL CHONDROPLASTY AND PATELLA, AND MEDIAL PLICO EXCISION;  Surgeon: Yvone Rush, MD;  Location: [facility];  Service: Orthopedics;  Laterality: Right;   LEFT HEART CATHETERIZATION WITH CORONARY ANGIOGRAM N/A 09/21/2013   Procedure: LEFT HEART CATHETERIZATION WITH CORONARY ANGIOGRAM;  Surgeon: Peter M Jordan, MD;  Location: [facility] CATH LAB;  Service: Cardiovascular;  Laterality: N/A;   MULTIPLE EXTRACTIONS WITH ALVEOLOPLASTY  12/06/2011   Procedure: MULTIPLE EXTRACION WITH ALVEOLOPLASTY;  Surgeon: Glendia CHRISTELLA Primrose, DDS;  Location: [facility] OR;  Service: Oral Surgery;  Laterality: Bilateral;  Biopsy tissue right vestibule   PERCUTANEOUS CORONARY STENT INTERVENTION (PCI-S)  09/21/2013   Procedure: PERCUTANEOUS CORONARY STENT INTERVENTION (PCI-S);  Surgeon: Peter M Jordan, MD;  Location: [facility] CATH LAB;  Service: Cardiovascular;;   POLYPECTOMY     VASECTOMY     Family History  Problem Relation Age of Onset   Heart disease Father    Heart disease Mother    Stroke Mother    Hypertension Sister    Colon polyps Sister    Colon cancer Neg Hx    Esophageal cancer Neg Hx    Rectal cancer Neg Hx    Stomach cancer Neg Hx    Social History   Occupational History   Not on file  Tobacco Use   Smoking status: Never    Passive exposure: Never   Smokeless tobacco: Current     Types: Chew    Last attempt to quit: 09/06/2011  Vaping Use   Vaping status: Never Used  Substance and Sexual Activity   Alcohol use: Yes    Comment:  maybe 3 beers a month- rare    Drug use: No   Sexual activity: Yes   Tobacco Counseling Ready to quit: Not Answered Counseling given: Not Answered  SDOH Screenings   Food Insecurity: No Food Insecurity (07/02/2024)  Housing: Low Risk (07/02/2024)  Transportation Needs: No Transportation Needs (07/02/2024)  Utilities: Not At Risk (07/02/2024)  Alcohol Screen: Low Risk (07/02/2024)  Depression (PHQ2-9): Low Risk (07/02/2024)  Financial Resource Strain: Low Risk (07/02/2024)  Physical Activity: Insufficiently Active (07/02/2024)  Social Connections: Moderately Isolated (07/02/2024)  Stress: No Stress Concern Present (07/02/2024)  Tobacco Use: High Risk (07/02/2024)  Health Literacy: Adequate Health Literacy (07/02/2024)   See flowsheets for full screening details  Depression Screen PHQ 2 & 9 Depression Scale- Over the past 2 weeks, how often have you been bothered by any of the following problems? Little interest or pleasure in doing things: 0 Feeling down, depressed, or hopeless (PHQ Adolescent also includes...irritable): 0 PHQ-2 Total Score: 0 Trouble falling or staying asleep, or sleeping too much: 0 Feeling tired or having little energy: 0 Poor appetite  or overeating (PHQ Adolescent also includes...weight loss): 0 Feeling bad about yourself - or that you are a failure or have let yourself or your family down: 0 Trouble concentrating on things, such as reading the newspaper or watching television (PHQ Adolescent also includes...like school work): 0 Moving or speaking so slowly that other people could have noticed. Or the opposite - being so fidgety or restless that you have been moving around a lot more than usual: 0 Thoughts that you would be better off dead, or of hurting yourself in some way: 0 PHQ-9 Total Score: 0 If you checked off  any problems, how difficult have these problems made it for you to do your work, take care of things at home, or get along with other people?: Not difficult at all     Goals Addressed             This Visit's Progress    Patient Stated       Wants to stay active             Objective:    Today's Vitals   07/02/24 1139  BP: 133/72  Pulse: 61  Weight: 230 lb (104.3 kg)  Height: 5' 8 (1.727 m)   Body mass index is 34.97 kg/m.  Hearing/Vision screen Hearing Screening - Comments:: No issues Vision Screening - Comments:: Glasses.  [employer], up to date Immunizations and Health Maintenance Health Maintenance  Topic Date Due   Zoster Vaccines- Shingrix (1 of 2) 09/25/1968   COVID-19 Vaccine (4 - 2025-26 season) 02/13/2024   Influenza Vaccine  09/11/2024 (Originally 01/13/2024)   Medicare Annual Wellness (AWV)  07/02/2025   Colonoscopy  07/14/2025   DTaP/Tdap/Td (4 - Td or Tdap) 11/29/2031   Pneumococcal Vaccine: 50+ Years  Completed   Hepatitis C Screening  Completed   Meningococcal B Vaccine  Aged Out        Assessment/Plan:  This is a routine wellness examination for Javonne.  Patient Care Team: Gretel App, NP as PCP - General (Nurse Practitioner) Thukkani, Arun K, MD as PCP - Cardiology (Cardiology) Maranda Leim DEL, MD as Consulting Physician (Cardiology) Verta Royden DASEN, DPM as Consulting Physician (Podiatry)  I have personally reviewed and noted the following in the patients chart:   Medical and social history Use of alcohol, tobacco or illicit drugs  Current medications and supplements including opioid prescriptions. Functional ability and status Nutritional status Physical activity Advanced directives List of other physicians Hospitalizations, surgeries, and ER visits in previous 12 months Vitals Screenings to include cognitive, depression, and falls Referrals and appointments  No orders of the defined types were placed in this  encounter.  In addition, I have reviewed and discussed with patient certain preventive protocols, quality metrics, and best practice recommendations. A written personalized care plan for preventive services as well as general preventive health recommendations were provided to patient.   Angeline Fredericks, LPN   8/80/7973   Return in 1 year (on 07/02/2025).  After Visit Summary: (Pick Up) Due to this being a telephonic visit, with patients personalized plan was offered to patient and patient has requested to Pick up at office.  Nurse Notes: Discussed the need to update flu and shingles vaccines. Patient declines covid vaccine.  "

## 2024-12-07 ENCOUNTER — Ambulatory Visit: Admitting: Nurse Practitioner

## 2025-07-08 ENCOUNTER — Ambulatory Visit
